# Patient Record
Sex: Female | Born: 1940 | Race: White | Hispanic: No | Marital: Married | Smoking: Never smoker
Health system: Southern US, Community
[De-identification: ages and names within clinical notes are randomized; demographics above are authoritative.]

## PROBLEM LIST (undated history)

## (undated) DIAGNOSIS — Z8719 Personal history of other diseases of the digestive system: Secondary | ICD-10-CM

## (undated) DIAGNOSIS — T8189XA Other complications of procedures, not elsewhere classified, initial encounter: Secondary | ICD-10-CM

## (undated) DIAGNOSIS — F32A Depression, unspecified: Secondary | ICD-10-CM

## (undated) DIAGNOSIS — I351 Nonrheumatic aortic (valve) insufficiency: Secondary | ICD-10-CM

## (undated) DIAGNOSIS — E039 Hypothyroidism, unspecified: Secondary | ICD-10-CM

## (undated) DIAGNOSIS — D6851 Activated protein C resistance: Secondary | ICD-10-CM

## (undated) DIAGNOSIS — R1900 Intra-abdominal and pelvic swelling, mass and lump, unspecified site: Secondary | ICD-10-CM

## (undated) DIAGNOSIS — I4821 Permanent atrial fibrillation: Secondary | ICD-10-CM

## (undated) DIAGNOSIS — K219 Gastro-esophageal reflux disease without esophagitis: Secondary | ICD-10-CM

## (undated) DIAGNOSIS — Z86718 Personal history of other venous thrombosis and embolism: Secondary | ICD-10-CM

## (undated) DIAGNOSIS — I10 Essential (primary) hypertension: Secondary | ICD-10-CM

## (undated) DIAGNOSIS — M1990 Unspecified osteoarthritis, unspecified site: Secondary | ICD-10-CM

## (undated) DIAGNOSIS — Z87442 Personal history of urinary calculi: Secondary | ICD-10-CM

## (undated) DIAGNOSIS — Z86018 Personal history of other benign neoplasm: Secondary | ICD-10-CM

## (undated) DIAGNOSIS — Z8489 Family history of other specified conditions: Secondary | ICD-10-CM

## (undated) DIAGNOSIS — R350 Frequency of micturition: Secondary | ICD-10-CM

## (undated) DIAGNOSIS — R310 Gross hematuria: Secondary | ICD-10-CM

## (undated) DIAGNOSIS — Z7901 Long term (current) use of anticoagulants: Secondary | ICD-10-CM

## (undated) DIAGNOSIS — R3915 Urgency of urination: Secondary | ICD-10-CM

## (undated) DIAGNOSIS — F329 Major depressive disorder, single episode, unspecified: Secondary | ICD-10-CM

## (undated) DIAGNOSIS — N200 Calculus of kidney: Secondary | ICD-10-CM

## (undated) DIAGNOSIS — Z853 Personal history of malignant neoplasm of breast: Secondary | ICD-10-CM

## (undated) DIAGNOSIS — Z72820 Sleep deprivation: Secondary | ICD-10-CM

## (undated) DIAGNOSIS — M7070 Other bursitis of hip, unspecified hip: Secondary | ICD-10-CM

## (undated) HISTORY — DX: Depression, unspecified: F32.A

## (undated) HISTORY — DX: Unspecified osteoarthritis, unspecified site: M19.90

## (undated) HISTORY — DX: Major depressive disorder, single episode, unspecified: F32.9

## (undated) HISTORY — PX: KNEE ARTHROSCOPY: SHX127

## (undated) HISTORY — DX: Nonrheumatic aortic (valve) insufficiency: I35.1

## (undated) HISTORY — PX: CARDIOVASCULAR STRESS TEST: SHX262

## (undated) HISTORY — DX: Permanent atrial fibrillation: I48.21

## (undated) HISTORY — PX: TONSILLECTOMY: SUR1361

---

## 1979-04-11 HISTORY — PX: DILATION AND CURETTAGE OF UTERUS: SHX78

## 1979-04-11 HISTORY — PX: TUBAL LIGATION: SHX77

## 1981-08-10 HISTORY — PX: ABDOMINAL HYSTERECTOMY: SHX81

## 1999-01-16 ENCOUNTER — Emergency Department (HOSPITAL_COMMUNITY): Admission: EM | Admit: 1999-01-16 | Discharge: 1999-01-16 | Payer: Self-pay | Admitting: Emergency Medicine

## 2000-03-01 ENCOUNTER — Encounter: Admission: RE | Admit: 2000-03-01 | Discharge: 2000-03-01 | Payer: Self-pay | Admitting: Obstetrics and Gynecology

## 2000-03-01 ENCOUNTER — Encounter: Payer: Self-pay | Admitting: Obstetrics and Gynecology

## 2001-03-02 ENCOUNTER — Encounter: Admission: RE | Admit: 2001-03-02 | Discharge: 2001-03-02 | Payer: Self-pay | Admitting: Obstetrics and Gynecology

## 2001-03-02 ENCOUNTER — Encounter: Payer: Self-pay | Admitting: Obstetrics and Gynecology

## 2002-03-06 ENCOUNTER — Encounter: Payer: Self-pay | Admitting: Obstetrics and Gynecology

## 2002-03-06 ENCOUNTER — Encounter: Admission: RE | Admit: 2002-03-06 | Discharge: 2002-03-06 | Payer: Self-pay | Admitting: Obstetrics and Gynecology

## 2002-10-11 ENCOUNTER — Encounter: Payer: Self-pay | Admitting: Family Medicine

## 2002-10-11 ENCOUNTER — Encounter: Admission: RE | Admit: 2002-10-11 | Discharge: 2002-10-11 | Payer: Self-pay | Admitting: Family Medicine

## 2003-02-16 ENCOUNTER — Ambulatory Visit (HOSPITAL_COMMUNITY): Admission: RE | Admit: 2003-02-16 | Discharge: 2003-02-16 | Payer: Self-pay | Admitting: Gastroenterology

## 2003-05-15 ENCOUNTER — Encounter: Admission: RE | Admit: 2003-05-15 | Discharge: 2003-05-15 | Payer: Self-pay | Admitting: Orthopedic Surgery

## 2003-05-15 ENCOUNTER — Encounter: Payer: Self-pay | Admitting: Orthopedic Surgery

## 2003-05-29 ENCOUNTER — Ambulatory Visit (HOSPITAL_BASED_OUTPATIENT_CLINIC_OR_DEPARTMENT_OTHER): Admission: RE | Admit: 2003-05-29 | Discharge: 2003-05-29 | Payer: Self-pay | Admitting: Orthopaedic Surgery

## 2003-06-05 ENCOUNTER — Encounter: Admission: RE | Admit: 2003-06-05 | Discharge: 2003-06-05 | Payer: Self-pay | Admitting: Orthopaedic Surgery

## 2003-06-05 ENCOUNTER — Inpatient Hospital Stay (HOSPITAL_COMMUNITY): Admission: AD | Admit: 2003-06-05 | Discharge: 2003-06-09 | Payer: Self-pay | Admitting: Internal Medicine

## 2004-01-29 ENCOUNTER — Ambulatory Visit (HOSPITAL_COMMUNITY): Admission: RE | Admit: 2004-01-29 | Discharge: 2004-01-29 | Payer: Self-pay | Admitting: Internal Medicine

## 2004-11-03 ENCOUNTER — Encounter: Admission: RE | Admit: 2004-11-03 | Discharge: 2004-11-03 | Payer: Self-pay | Admitting: *Deleted

## 2005-10-30 ENCOUNTER — Emergency Department (HOSPITAL_COMMUNITY): Admission: EM | Admit: 2005-10-30 | Discharge: 2005-10-30 | Payer: Self-pay | Admitting: Emergency Medicine

## 2006-02-01 ENCOUNTER — Encounter: Admission: RE | Admit: 2006-02-01 | Discharge: 2006-02-01 | Payer: Self-pay | Admitting: Obstetrics and Gynecology

## 2006-05-07 ENCOUNTER — Encounter (INDEPENDENT_AMBULATORY_CARE_PROVIDER_SITE_OTHER): Payer: Self-pay | Admitting: Cardiology

## 2006-05-07 ENCOUNTER — Encounter: Payer: Self-pay | Admitting: Emergency Medicine

## 2006-05-07 ENCOUNTER — Inpatient Hospital Stay (HOSPITAL_COMMUNITY): Admission: EM | Admit: 2006-05-07 | Discharge: 2006-05-08 | Payer: Self-pay | Admitting: Cardiology

## 2007-01-07 ENCOUNTER — Ambulatory Visit (HOSPITAL_COMMUNITY): Admission: RE | Admit: 2007-01-07 | Discharge: 2007-01-07 | Payer: Self-pay | Admitting: Urology

## 2007-01-07 ENCOUNTER — Encounter (INDEPENDENT_AMBULATORY_CARE_PROVIDER_SITE_OTHER): Payer: Self-pay | Admitting: Urology

## 2007-01-07 HISTORY — PX: OTHER SURGICAL HISTORY: SHX169

## 2007-05-11 ENCOUNTER — Encounter: Admission: RE | Admit: 2007-05-11 | Discharge: 2007-05-11 | Payer: Self-pay | Admitting: Obstetrics and Gynecology

## 2007-09-15 ENCOUNTER — Ambulatory Visit (HOSPITAL_BASED_OUTPATIENT_CLINIC_OR_DEPARTMENT_OTHER): Admission: RE | Admit: 2007-09-15 | Discharge: 2007-09-15 | Payer: Self-pay | Admitting: Urology

## 2007-09-15 HISTORY — PX: OTHER SURGICAL HISTORY: SHX169

## 2007-09-19 ENCOUNTER — Ambulatory Visit (HOSPITAL_COMMUNITY): Admission: RE | Admit: 2007-09-19 | Discharge: 2007-09-19 | Payer: Self-pay | Admitting: Urology

## 2008-05-08 ENCOUNTER — Ambulatory Visit: Payer: Self-pay | Admitting: Urology

## 2008-06-13 ENCOUNTER — Encounter: Admission: RE | Admit: 2008-06-13 | Discharge: 2008-06-13 | Payer: Self-pay | Admitting: Obstetrics and Gynecology

## 2008-08-10 HISTORY — PX: BREAST BIOPSY: SHX20

## 2009-01-28 ENCOUNTER — Encounter: Admission: RE | Admit: 2009-01-28 | Discharge: 2009-01-28 | Payer: Self-pay | Admitting: Orthopedic Surgery

## 2009-06-25 ENCOUNTER — Encounter: Admission: RE | Admit: 2009-06-25 | Discharge: 2009-06-25 | Payer: Self-pay | Admitting: Obstetrics and Gynecology

## 2009-07-11 ENCOUNTER — Encounter: Admission: RE | Admit: 2009-07-11 | Discharge: 2009-07-11 | Payer: Self-pay | Admitting: Obstetrics and Gynecology

## 2009-07-18 ENCOUNTER — Encounter: Admission: RE | Admit: 2009-07-18 | Discharge: 2009-07-18 | Payer: Self-pay | Admitting: Obstetrics and Gynecology

## 2009-07-23 ENCOUNTER — Ambulatory Visit: Payer: Self-pay | Admitting: Oncology

## 2009-07-25 ENCOUNTER — Ambulatory Visit: Admission: RE | Admit: 2009-07-25 | Discharge: 2009-08-09 | Payer: Self-pay | Admitting: Radiation Oncology

## 2009-07-31 ENCOUNTER — Encounter: Admission: RE | Admit: 2009-07-31 | Discharge: 2009-07-31 | Payer: Self-pay | Admitting: General Surgery

## 2009-08-05 ENCOUNTER — Ambulatory Visit (HOSPITAL_BASED_OUTPATIENT_CLINIC_OR_DEPARTMENT_OTHER): Admission: RE | Admit: 2009-08-05 | Discharge: 2009-08-05 | Payer: Self-pay | Admitting: General Surgery

## 2009-08-05 ENCOUNTER — Encounter: Admission: RE | Admit: 2009-08-05 | Discharge: 2009-08-05 | Payer: Self-pay | Admitting: General Surgery

## 2009-08-05 HISTORY — PX: PARTIAL MASTECTOMY WITH AXILLARY SENTINEL LYMPH NODE BIOPSY: SHX6004

## 2009-08-21 ENCOUNTER — Ambulatory Visit: Admission: RE | Admit: 2009-08-21 | Discharge: 2009-10-14 | Payer: Self-pay | Admitting: Radiation Oncology

## 2009-09-02 ENCOUNTER — Ambulatory Visit: Payer: Self-pay | Admitting: Oncology

## 2009-09-02 ENCOUNTER — Encounter: Admission: RE | Admit: 2009-09-02 | Discharge: 2009-09-02 | Payer: Self-pay | Admitting: Radiation Oncology

## 2009-09-04 LAB — CBC WITH DIFFERENTIAL/PLATELET
BASO%: 0.5 % (ref 0.0–2.0)
EOS%: 6.8 % (ref 0.0–7.0)
LYMPH%: 32.2 % (ref 14.0–49.7)
MCH: 31.3 pg (ref 25.1–34.0)
MCHC: 34 g/dL (ref 31.5–36.0)
MCV: 92 fL (ref 79.5–101.0)
MONO%: 12.6 % (ref 0.0–14.0)
Platelets: 198 10*3/uL (ref 145–400)
RBC: 4.38 10*6/uL (ref 3.70–5.45)
WBC: 4 10*3/uL (ref 3.9–10.3)

## 2009-09-04 LAB — COMPREHENSIVE METABOLIC PANEL
ALT: 17 U/L (ref 0–35)
Alkaline Phosphatase: 84 U/L (ref 39–117)
Creatinine, Ser: 0.81 mg/dL (ref 0.40–1.20)
Sodium: 140 mEq/L (ref 135–145)
Total Bilirubin: 0.5 mg/dL (ref 0.3–1.2)
Total Protein: 6.9 g/dL (ref 6.0–8.3)

## 2009-11-05 ENCOUNTER — Ambulatory Visit: Payer: Self-pay | Admitting: Oncology

## 2010-03-03 ENCOUNTER — Encounter: Admission: RE | Admit: 2010-03-03 | Discharge: 2010-03-03 | Payer: Self-pay | Admitting: Obstetrics and Gynecology

## 2010-06-17 ENCOUNTER — Ambulatory Visit: Payer: Self-pay | Admitting: Oncology

## 2010-08-10 HISTORY — PX: CATARACT EXTRACTION W/ INTRAOCULAR LENS  IMPLANT, BILATERAL: SHX1307

## 2010-08-27 ENCOUNTER — Encounter
Admission: RE | Admit: 2010-08-27 | Discharge: 2010-08-27 | Payer: Self-pay | Source: Home / Self Care | Attending: Obstetrics and Gynecology | Admitting: Obstetrics and Gynecology

## 2010-11-10 LAB — COMPREHENSIVE METABOLIC PANEL
ALT: 15 U/L (ref 0–35)
Alkaline Phosphatase: 72 U/L (ref 39–117)
BUN: 13 mg/dL (ref 6–23)
CO2: 31 mEq/L (ref 19–32)
Calcium: 8.8 mg/dL (ref 8.4–10.5)
GFR calc non Af Amer: >60 mL/min (ref >60)
Glucose, Bld: 81 mg/dL (ref 70–99)
Potassium: 4.2 mEq/L (ref 3.5–5.1)
Total Protein: 6.7 g/dL (ref 6.0–8.3)

## 2010-11-10 LAB — DIFFERENTIAL
Basophils Absolute: 0 10*3/uL (ref 0.0–0.1)
Basophils Relative: 1 % (ref 0–1)
Eosinophils Absolute: 0 10*3/uL (ref 0.0–0.7)
Monocytes Relative: 11 % (ref 3–12)
Neutro Abs: 1.7 10*3/uL (ref 1.7–7.7)
Neutrophils Relative %: 50 % (ref 43–77)

## 2010-11-10 LAB — URINE MICROSCOPIC-ADD ON

## 2010-11-10 LAB — URINALYSIS, ROUTINE W REFLEX MICROSCOPIC
Glucose, UA: NEGATIVE mg/dL
Ketones, ur: NEGATIVE mg/dL
Nitrite: NEGATIVE
Protein, ur: NEGATIVE mg/dL
Urobilinogen, UA: 1 mg/dL (ref 0.0–1.0)

## 2010-11-10 LAB — CBC
HCT: 38.2 % (ref 36.0–46.0)
Hemoglobin: 13.3 g/dL (ref 12.0–15.0)
MCHC: 34.8 g/dL (ref 30.0–36.0)
RBC: 4.15 MIL/uL (ref 3.87–5.11)
RDW: 13 % (ref 11.5–15.5)

## 2010-11-10 LAB — LACTATE DEHYDROGENASE: LDH: 185 U/L (ref 94–250)

## 2010-11-10 LAB — APTT: aPTT: 25 seconds (ref 24–37)

## 2010-11-10 LAB — URINE CULTURE

## 2010-12-23 NOTE — Op Note (Signed)
NAMEEBONIQUE, HALLSTROM               ACCOUNT NO.:  [id number]   MEDICAL RECORD NO.:  [id number]          PATIENT TYPE:  AMB   LOCATION:  DAY                          FACILITY:  [facility]   PHYSICIAN:  Jamison Neighbor, M.D.  DATE OF BIRTH:  Aug 19, 1940   DATE OF PROCEDURE:  01/07/2007  DATE OF DISCHARGE:                               OPERATIVE REPORT   PREOPERATIVE DIAGNOSIS:  1. Gross hematuria.  2. Right renal calculus.  3. Squamous metaplasia/cystitis cystic   POSTOPERATIVE DIAGNOSIS:  1. Gross hematuria.  2. Right renal calculus.  3. Squamous metaplasia/cystitis cystic   PROCEDURE:  Cystoscopy, bilateral retrogrades with interpretation and  bladder biopsy with fulguration.   SURGEON:  Jamison Neighbor, M.D.   ANESTHESIA:  General.   COMPLICATIONS:  None.   DRAINS:  None.   BRIEF HISTORY:  This 70 year old female has a problem with gross  hematuria.  The patient underwent evaluation several years ago which was  unremarkable.  More recently she had a CT scan which suggested she had a  stone in the right renal pelvis without obstruction.  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  Because of the  gross hematuria however it was felt that the patient should have this  area biopsied and then cauterized to stop this problem for  her.  The  patient understands the risks and benefits of the procedure and gave  full informed consent.  Her Coumadin has been stopped in preparation for  the procedure.   PROCEDURE:  After successful induction of general anesthesia the patient  was placed in the dorsal lithotomy position, prepped with Betadine and  draped in the usual sterile fashion.  Careful bimanual examination  revealed no palpable masses.  The patient does not have a cystocele,  rectocele or enterocele.  The urethra was noted be somewhat patulous and  cystoscopic examination  confirmed the fact that she has a somewhat open  bladder neck.  This would be consistent with urinary incontinence.  Careful inspection of the bladder showed that the patient did have a  polyp at the base of the bladder that was cauterized and also standard  findings consistent with squamous metaplasia.  The ureteral orifices  were identified and were normal.  The right ureter was slightly tighter  than the left but both were within normal limits.  The remainder of the  bladder was completely unremarkable in its appearance.   A right retrograde was performed through a 6-French ureteral catheter.  The ureter was of normal caliber all the way up to the pelvis.  The CT  scan suggested that there was a stone.  It was a little difficult to see  that on the fluoroscopic image but it was noted that the patient has a  slightly dilated collecting system.  There was no evidence of a clear-  cut filling defect but there did appear to be a possible stone towards  the UPJ.  The drain out films showed a little bit of a delay  on drainage  of the right kidney but the ureter drained normally.  A retrograde study  was performed on the left-hand side with a 6-French ureteral catheter as  well.  This was completely normal.  A finely formed  caliceal system was  identified .  Drainage was normal with no signs of any obstruction.   After completion of the retrograde study the biopsy forceps were used to  take a biopsy of the polypoid lesion which was removed in its entirety.  The biopsy site was cauterized as was some of the metaplastic area  around it to decrease her risk for hematuria.  The bladder was drained  and the patient was taken to recovery room in good condition.  She will  be sent out with Lorcet Plus, Pyridium Plus and doxycycline.  We will  see her back in follow-up.  At that point we will discuss with her the  possibility of treating that renal calculus.  Clearly the fact that she  does have  atrial fibrillation and will need to come off Coumadin will be  somewhat of a problem but as I certainly believe that if the patient is  symptomatic on that side the stone can be treated with ESWL without too  much trouble.           ______________________________  Jamison Neighbor, M.D.  Electronically Signed     RJE/MEDQ  D:  01/07/2007  T:  01/07/2007  Job:  [id number]

## 2010-12-23 NOTE — Op Note (Signed)
Jamie Coffey, Jamie Coffey               ACCOUNT NO.:  [id number]   MEDICAL RECORD NO.:  [id number]          PATIENT TYPE:  AMB   LOCATION:  NESC                         FACILITY:  [facility]   PHYSICIAN:  Boston Service, M.D.DATE OF BIRTH:  1941-02-02   DATE OF PROCEDURE:  09/15/2007  DATE OF DISCHARGE:                               OPERATIVE REPORT   PREOPERATIVE DIAGNOSIS:  A 70 year old female with intermittent episodes  of gross hematuria while on Coumadin.  Evaluation shows progressive  growth of an 11 mm stone within the right kidney.  The patient has  periodic hematuria usually related to exertion.  CT and cystoscopy  otherwise normal.  Discussed situation with the patient.  Am inclined to  proceed with stent placement followed by ESWL.  The patient will be off  of Coumadin during those procedures.   POSTOPERATIVE DIAGNOSIS:  A 70 year old female with intermittent  episodes of gross hematuria while on Coumadin.  Evaluation shows  progressive growth of an 11 mm stone within the right kidney.  The  patient has periodic hematuria usually related to exertion.  CT and  cystoscopy otherwise normal.  Discussed situation with the patient.  Am  inclined to proceed with stent placement followed by ESWL.  The patient  will be off of Coumadin during those procedures.   PROCEDURE:  Cystoscopy, retrograde double J stent placement.   ANESTHESIA:  General.   DRAINS:  A 6 French 24 cm double J stent.   COMPLICATIONS:  None obvious.   ANESTHESIA:  General.   DESCRIPTION OF PROCEDURE:  The patient was prepped and draped in the  dorsal lithotomy position after institution of an adequate level of  general anesthesia.  A well lubricated 21 French panendoscope was gently  inserted at the urethral meatus, normal urethra and sphincter.  Bladder  was carefully inspected with the 12 and 70 degree lenses.  No obvious  evidence of intravesical pathology.   Blocking catheter was selected, retrogrades  were performed.  Normal  course and caliber of the ureter, pelvis and calyces on the left.  The  patient had normal course and caliber of the ureter, pelvis and calyces  on the right with an 11 mm filling defect in the lower pole calyces.  Ureter measured at about 24 cm.  A 6 French 24 cm double J stent  was selected, passed over the indwelling guidewire with excellent  pigtail formation on guidewire removal.  Bladder was drained.  Cystoscope was removed.  The patient was given a B and O suppository and  returned to recovery in satisfactory condition.           ______________________________  Boston Service, M.D.     RH/MEDQ  D:  09/15/2007  T:  09/16/2007  Job:  [id number]   cc:   Armanda Magic, M.D.  Fax: [phone]   Barry Dienes. Eloise Harman, M.D.  Fax: [phone]

## 2010-12-26 NOTE — Discharge Summary (Signed)
NAME:  Jamie Coffey NO.:  [id number]   MEDICAL RECORD NO.:  [id number]                   PATIENT TYPE:  INP   LOCATION:  0355                                 FACILITY:  [facility]   PHYSICIAN:  Sherin Quarry, MD                   DATE OF BIRTH:  1941/01/02   DATE OF ADMISSION:  06/05/2003  DATE OF DISCHARGE:                                 DISCHARGE SUMMARY   Jamie Coffey was admitted to [facility] on October 26.  At that  time she indicated that 1 week prior to the admission she had undergone left  knee arthroscopy for evaluation of a cartilage injury. She was done as an  outpatient.  Postprocedure she noted persistent discomfort in the leg with  gradual onset of swelling. In the last 48 hours she had experienced mild  chest pressure with the sense of aching and soreness in her chest.  In light  of these symptoms, the patient was sent for a venous Doppler on October 26  and was found to have evidence of a DVT in the left lower extremity.  A  Spiral CT scan of the chest was performed which showed 2 areas of the lung  consistent with pulmonary embolus.  The patient was, therefore, admitted for  the purpose of anticoagulation.   PHYSICAL EXAMINATION:  GENERAL:  On physical exam she was a pleasant,  cooperative lady who was in no acute distress.  HEENT:  Exam was within normal limits.  CHEST:  The chest was clear.  CARDIOVASCULAR:  Exam revealed normal S1 and S2 without rubs, murmurs, or  gallops.  ABDOMEN:  The abdomen was benign.  NEUROLOGIC:  Testing was within normal limits.  EXTREMITIES:  Examination of the extremities revealed diffuse mild swelling  of the left lower extremity from the knee to the ankle.  There was visual  evidence of previous arthroscopy procedure.   LABS:  Relevant laboratory studies obtained included a CBC which revealed a  hemoglobin of 10.6, white count was 8500.  Sodium 137, potassium 4.2, the  creatinine was  0.9. The initial INR was 0.9.   HOSPITAL COURSE:  The patient was started on a regimen of Lovenox  subcutaneously q.12h.  The dose, based on weight, was 60 mg q.12h.  She was  also begun on Coumadin for the purpose of anticoagulation. The patient  experienced improvement in the feeling of her leg and was able to gradually  increase ambulation.  By October 30, she was felt to be a candidate for  discharge.   DISCHARGE DIAGNOSES:  1. Deep vein thrombophlebitis, left leg.  2. Pulmonary embolus by spiral CT scan.  3. History of hypertension.  4. History of hypothyroidism.  5. On discharge the patient will continue Atenolol 25 mg daily and Synthroid     0.5 mg daily.  6. She was counseled to discontinue Premarin.  7. She will also take Coumadin.  I suggested that since her INR was 2.7 at     the time of discharge that she take 2.5     mg of Coumadin on Saturday, 5 mg on Sunday and then report to Dr. Shaune Pollack office on Monday to have a repeat prothrombin time.   CONDITION:  Her condition at the time of discharge was good.                                               Sherin Quarry, MD    SY/MEDQ  D:  06/09/2003  T:  06/09/2003  Job:  [id number]   cc:   Duncan Dull, M.D.  [address]  [place]  [place] [address]  Fax: [phone]   Claude Manges. Cleophas Dunker, M.D.  [address] [place]  [place] [address]  Fax: [phone]

## 2010-12-26 NOTE — Op Note (Signed)
NAME:  Jamie Coffey, Jamie Coffey                         ACCOUNT NO.:  [id number]   MEDICAL RECORD NO.:  [id number]                   PATIENT TYPE:  AMB   LOCATION:  ENDO                                 FACILITY:  [facility]   PHYSICIAN:  Anselmo Rod, M.D.               DATE OF BIRTH:  [birth year]   DATE OF PROCEDURE:  02/16/2003  DATE OF DISCHARGE:                                 OPERATIVE REPORT   PROCEDURE:  Screening colonoscopy.   ENDOSCOPIST:  Anselmo Rod, M.D.   INSTRUMENT USED:  Olympus video colonoscope.   INDICATIONS FOR PROCEDURE:  A 70 year old white female with a family history  of  colon cancer in a maternal aunt and uterine cancer in her mother,  undergoing screening colonoscopy, rule out colonic polyps, masses, etc.   PREPROCEDURE PREPARATION:  Informed consent was obtained from the patient  and the patient was  fasted for 8 hours prior to  the procedure and prepped  with a bottle of magnesium citrate and a gallon of GoLYTELY the  night prior  to the procedure. The patient was given Cipro 400 mg IV for  MVP prophylaxis  prior to the procedure.   PREPROCEDURE PHYSICAL:  The patient had stable vital signs. Neck supple.  Chest clear to auscultation, normal S1, S2. Abdomen soft with normoactive  bowel sounds.   DESCRIPTION OF PROCEDURE:  The patient was placed in the left lateral  decubitus position and sedated with 60 mg of Demerol and 6 mg of Versed  intravenously. Once sedation was adequate, the patient was maintained on low  flow oxygen and continuous cardiac monitoring.   The Olympus video colonoscope was advanced into the rectum to the cecum  without difficulty.  There was some residual  stool  in the colon. Multiple  washings were done. Small  lesions could have been missed. No masses,  polyps, erosions or ulcerations or diverticula were seen.  The appendiceal  orifice and ileocecal valve were clearly visualized and photographed.  Retroflexion in the rectum  revealed no abnormalities as well.   IMPRESSION:  Normal colonoscopy.   RECOMMENDATIONS:  1. Repeat colorectal cancer screening is recommended in the next 5 years     unless the patient develops any     abnormal symptoms in the interim.  2. High fiber diet with liberal fluid intake.  3. Outpatient followup if the need arises in the future.                                                Anselmo Rod, M.D.    JNM/MEDQ  D:  02/16/2003  T:  02/17/2003  Job:  [id number]   cc:   Duncan Dull, M.D.  [address]  [place]  [place] [address]  Fax:  [phone]  

## 2010-12-26 NOTE — H&P (Signed)
[name], Coffey               ACCOUNT NO.:  [id number]   MEDICAL RECORD NO.:  [id number]          PATIENT TYPE:  INP   LOCATION:  3743                         FACILITY:  [facility]   PHYSICIAN:  Ulyses Amor, MD DATE OF BIRTH:  [birth year]   DATE OF ADMISSION:  05/07/2006  DATE OF DISCHARGE:                                HISTORY & PHYSICAL   HISTORY OF PRESENT ILLNESS:  Jamie Coffey is a 70 year old white woman who  was admitted to [facility] for further evaluation of chest pain and  new onset atrial fibrillation.   The patient, who has no past history of cardiac disease, experienced the  onset of chest pain while she was lying in bed this evening.  This was  described as a sharp and pressured discomfort in her mid substernal region.  It did not radiate.  It was associated with a sensation of a rapid and  irregular heart beat.  There was no associated dyspnea, diaphoresis, or  nausea.  There were no exacerbating or alleviating factors.  It appeared not  to be related to position, activity, meals, or respirations.  She ultimately  presented to the emergency department at [facility] where she was  found to be in atrial fibrillation with a moderate ventricular rate.  Her  chest pain ultimately resolved.  The total duration of chest pain was  approximately four hours.   As noted, the patient has no past history of cardiac disease, no history of  chest pain, myocardial infarction, congestive heart failure, or arrhythmia.  She does note erratic skipped heart beats in the past.  The patient has a  number of risk factors for coronary artery disease including hypertension  and a family history of coronary artery disease (both parents).  The patient  has no history of dyslipidemia, diabetes mellitus, or smoking.   The patient's only other medical problem is that of hypothyroidism.   OPERATIONS:  Hysterectomy, right knee arthroscopy.  The knee surgery was  followed  by what sounds like deep venous thrombosis and pulmonary embolus  for which she was on six months of Coumadin.   MEDICATIONS:  Atenolol, Mobic, Synthroid.   ALLERGIES:  None.   SIGNIFICANT INJURIES:  None.   SOCIAL HISTORY:  The patient lives with her husband.  She does not work.  She does not smoke cigarettes.  She drinks occasional alcohol.   FAMILY HISTORY:  Coronary artery disease as described above.   REVIEW OF SYSTEMS:  Review of systems revealed no new problems related to  her head, eyes, ears, nose, mouth, throat, lungs, gastrointestinal system,  genitourinary system, or extremities.  There was no history of neurologic or  psychiatric disorder.  There was no history of fever, chills or weight loss.   PHYSICAL EXAMINATION:  GENERAL:  The patient was a middle-aged white woman  in no discomfort.  She was alert, oriented, appropriate, and responsive.  VITAL SIGNS:  Blood pressure 136/79, pulse 70 and irregularly irregular,  respirations 16, temperature 96.9.  Pulse ox 99% on room air.  HEENT:  Head, eyes, nose, and mouth  were normal.  NECK:  The neck was without thyromegaly or adenopathy.  Carotid pulses were  palpable bilaterally and without bruits.  CARDIAC:  Examination revealed an irregularly irregular rhythm.  There was  no gallop, murmur, rub, or click.  No chest wall tenderness was noted.  LUNGS:  The lungs were clear.  ABDOMEN:  The abdomen was soft and nontender.  There was no mass,  hepatosplenomegaly, bruit, distention, rebound, guarding, or rigidity.  Bowel sounds were normal.  BREASTS, PELVIC, RECTAL:  Examinations were not performed as they were not  pertinent to the reason for acute care hospitalization.  EXTREMITIES:  The extremities were without edema, deviation or deformity.  Radial and dorsalis pedis pulses were palpable bilaterally.  NEUROLOGIC:  Brief screening neurologic survey was unremarkable.   ANCILLARY DATA:  The electrocardiogram revealed atrial  fibrillation with  occasional aberrantly conducted beats.  The ventricular rate was 86 beats  per minute.  The chest radiograph, according to the radiologist, revealed  probable mild chronic obstructive pulmonary disease.  There was no evidence  of acute cardiopulmonary disease.  The initial set of cardiac markers  revealed a myoglobin of 23.4, CK-MB less than, and troponin less than 0.05.  Potassium is 3.4 with a BUN of 23 and creatinine of 0.7.  White count was  4.6 with a hemoglobin of 13.2 and hematocrit of 38.2.  The remaining studies  were pending at the time of this dictation.   IMPRESSION:  1. Chest pain, rule out coronary artery disease.  2. New onset atrial fibrillation.  3. Hypertension.  4. Hypothyroidism.   PLAN:  1. Telemetry.  2. Serial cardiac enzymes.  3. Thyroid function tests.  4. Echocardiogram.  5. Aspirin.  6. Intravenous heparin.  7. Intravenous nitroglycerin.  8. Fasting lipid profile.  9. No ventricular rate control is needed at this time.  10.Further measures per Dr. Mayford Knife.      Ulyses Amor, MD  Electronically Signed     MSC/MEDQ  D:  05/07/2006  T:  05/07/2006  Job:  [id number]   cc:   Armanda Magic, M.D.

## 2010-12-26 NOTE — Op Note (Signed)
NAME:  Jamie Coffey, Jamie Coffey                         ACCOUNT NO.:  [id number]   MEDICAL RECORD NO.:  [id number]                   PATIENT TYPE:  AMB   LOCATION:  DSC                                  FACILITY:  [facility]   PHYSICIAN:  Claude Manges. Cleophas Dunker, M.D.            DATE OF BIRTH:  1941-03-19   DATE OF PROCEDURE:  05/29/2003  DATE OF DISCHARGE:                                 OPERATIVE REPORT   PREOPERATIVE DIAGNOSIS:  Tear, medial meniscus right knee.   POSTOPERATIVE DIAGNOSES:  1. Tear, medial meniscus right knee.  2. Chondromalacia, patella and medial compartment.   PROCEDURE:  1. Arthroscopic partial medial meniscectomy.  2. Arthroscopic shaving of patella and medial compartment.   SURGEON:  Claude Manges. Cleophas Dunker, M.D.   ANESTHESIA:  IV sedation and local 1% Xylocaine knee block.   COMPLICATIONS:  None.   HISTORY:  A 70 year old female has been experiencing episodic knee pain for  several months.  She has had an MRI scan recently that revealed a tear of  the posterior horn of the medial meniscus.  She is experiencing medial joint  pain without history of injury or trauma.  Plain x-rays did not reveal any  significant arthrosis.  She is now to have an arthroscopic evaluation.   DESCRIPTION OF PROCEDURE:  With the patient comfortable on the operating  table and under IV sedation the right lower extremity was placed in a thigh  hold.  The leg was then prepped with Duraprep from the thigh hold to the  ankle.  Sterile draping was performed.  The patient did have a Xylocaine  knee block preoperatively.   Diagnostic arthroscopy was performed using a medial and lateral parapatellar  tendon puncture site.  There was a clear yellow joint effusion.   Diagnostic arthroscopy revealed chondromalacia of the patella diffusely and  perhaps more so medially than laterally.  There was no evidence of any loose  materila.  There was very mild synovitis.  Shaving of the patella was  performed so  that the remaining articular cartilage was stable.   Both gutters were clear of loose material.   The lateral compartment was clear of meniscal pathology or significant  chondromalacia.  The ACL appeared to be intact.   The medial compartment revealed a chronic tear of the posterior horn of the  medial meniscus.  There were horizontal cleavage tears and flap tears.  These were debrided with the basket forceps and intraarticular shaver and  the ArthroCare wand so that a very nice, stable, tapered meniscal rim about  the mid portion of the meniscus.  From the anterior to the mid portion the  meniscus appeared to be intact.  There was diffuse chondromalacia over the  femoral surface which was debrided.  The tibial plateau revealed even more  minimal changes.  I suspect that the femoral changes were probably grade 2.   The joint was then explored without evidence  of loose material.  The two  stab wounds were left open, infiltrated with 0.25% Marcaine with  epinephrine. and sterile bulky dressing was applied followed by an Ace  bandage.   PLAN:  Percocet for pain.  Office in one week.                                               Claude Manges. Cleophas Dunker, M.D.    PWW/MEDQ  D:  05/29/2003  T:  05/29/2003  Job:  [id number]

## 2010-12-26 NOTE — Discharge Summary (Signed)
Jamie Coffey, SCHMUHL               ACCOUNT NO.:  [id number]   MEDICAL RECORD NO.:  [id number]          PATIENT TYPE:  INP   LOCATION:  3743                         FACILITY:  [facility]   PHYSICIAN:  Armanda Magic, M.D.     DATE OF BIRTH:  1941-03-26   DATE OF ADMISSION:  05/07/2006  DATE OF DISCHARGE:  05/08/2006                               DISCHARGE SUMMARY   ADMISSION DIAGNOSES:  1. Chest pain.  2. New onset atrial fibrillation with a moderate ventricular rate.   DISCHARGE DIAGNOSES:  1. Chest pain, resolved.  Stable without angina.  2. New onset atrial fibrillation status post spontaneous conversion to      normal sinus rhythm on May 08, 2006.  3. Hypertension.  4. Hypothyroidism.  5. Normal left ventricular function with ejection fraction of 55%.  6. Normal adenosine Cardiolite without inducible ischemia May 08, 2006.  7. Hypokalemia, repleted.   PROCEDURE:  A 2-D echocardiogram May 07, 2006, normal LV systolic  function with an EF of 55%, no regional wall motion abnormalities.  Mild  asymmetric septal hypertrophy.  Aortic valve thickness was mildly  increased.  Trivial aortic valvular regurgitation.  Right atrium was  mildly dilated.   HOSPITAL COURSE:  Mrs. Clos is a 70 year old female with no known  history of coronary artery disease.  She was admitted to the [facility].  [facility] with chest pain and new onset atrial fibrillation  on May 07, 2006.  The patient was started on IV heparin and IV  nitroglycerin.  Point of care enzymes were negative x1.  Serial cardiac  enzymes x5 revealed a peak troponin of 0.02.  TSH was elevated at 7.330.  A 12-lead EKG revealed atrial fibrillation, new onset with a moderate  ventricular response at 121 beats per minute with nonspecific ST  abnormalities without evidence of ischemia.  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  The repeat chest x-  ray revealed  cardiomegaly and mild to moderate vascular congestion.  BNP  was mildly elevated at 226.0.  The patient had no complaints of  shortness of breath other than its association with chest pain upon  admission.  D-dimer was negative.  The patient was hypokalemic during  the admission, however, her potassium was repleted.  The patient was  started on Coumadin per pharmacy protocol.  The IV heparin drip was  changed to subcu Lovenox every 12 hours per pharmacy protocol.  Later  that afternoon the patient had a recurrence of chest pain, rated 6/10,  however, EKG revealed no evidence of ischemia.  By the time the EKG was  obtained, the patient admitted to being chest pain-free.  Also on the  same day the patient converted to normal sinus rhythm, spontaneously.  She was scheduled for an adenosine Cardiolite in the a.m. which was  negative for ischemia with an EF of 61%.  The patient was weaned off of  IV nitroglycerin prior to her stress test.  She was also treated with  atenolol 25 mg daily for atrial fibrillation which was  held prior to her  stress test.  A social work consult was written for home Lovenox,  however, once the patient converted to normal sinus rhythm and remained  in normal sinus rhythm, she did not need to be discharged to home on  Lovenox.  The patient remained chest pain-free as well as in normal  sinus rhythm for the rest of her stay in the hospital and was discharged  to home in stable condition on May 08, 2006.   LABORATORY DATA:  The point of care enzymes revealed myoglobin 23.4, CK-  MB less than 1, troponin I less than 0.05.  BNP 226.  TSH 7.330.  Fasting lipid panel with total cholesterol 193, triglycerides 23, , HDL  76, LDL 112.  Serial cardiac enzymes revealed CK totals 62, 55, 55, 48,  42, and 39, respectively; CK-MBs 1.2, 1.3, 1.3, 1, 0.9, and 0.8,  respectively.  Troponin I less than 0.01, 0.01, 0.01, 0.02, 0.02, and  0.01, respectively.  Sodium 144, potassium  5.1, chloride 109, CO2 31,  glucose 106, BUN 27, creatinine 1.0, calcium 9.3.  White blood count  4.4, hemoglobin 13.5, hematocrit 39.2, platelets 218. PT 14.5, INR 1.1,  PTT greater than 200 upon admission.  PT 13.4, INR 1 and heparin level  0.54 prior to discharge.   X-RAYS:  1. 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  No acute      findings.  2. X-ray May 07, 2006, cardiomegaly and mild to moderate      vascular congestion.  3. Adenosine Cardiolite normal.  No evidence of inducible ischemia.      EF 61%.  That was performed on May 08, 2006.   EKG:  1. EKG May 07, 2006, upon admission as stated in the hospital      course.  2. May 07, 2006, atrial fibrillation with a ventricular rate of      98 beats per minute with nonspecific ST abnormality.  No evidence      of ischemia.   CONDITION ON DISCHARGE:  Mrs. Uresti was discharged to home in stable  condition May 08, 2006, without complaints of chest pain,  palpitations, shortness of breath or dizziness.  She remained in normal  sinus rhythm upon discharge.   DISCHARGE MEDICATIONS:  1. Atenolol 25 mg daily.  2. Synthroid 0.05 mg daily.  3. Mobic as needed.  4. Aspirin 81 mg daily.  5. Coumadin 5 mg daily except for 2.5 mg on Monday.  6. Benadryl 25 mg as needed.   DISCHARGE INSTRUCTIONS:  None.   FOLLOW-UP APPOINTMENTS:  The patient was advised to schedule a follow-up  appointment with Dr. Armanda Magic at [facility] within 2 weeks.  She was asked to call [phone] to schedule the appointment.      Tylene Fantasia, [place]      Armanda Magic, M.D.  Electronically Signed    RDM/MEDQ  D:  06/21/2006  T:  06/21/2006  Job:  [id number]

## 2010-12-26 NOTE — H&P (Signed)
NAME:  [name], [name] NO.:  [id number]   MEDICAL RECORD NO.:  [id number]                   PATIENT TYPE:  INP   LOCATION:  0355                                 FACILITY:  [facility]   PHYSICIAN:  Sherin Quarry, MD                   DATE OF BIRTH:  1941-06-26   DATE OF ADMISSION:  06/05/2003  DATE OF DISCHARGE:                                HISTORY & PHYSICAL   HISTORY:  Jamie Coffey is a very pleasant 70 year old lady who states that  one week prior to this admission, she underwent left knee arthroscopy for  evaluation of a cartilage injury, this was done as an outpatient. Post  procedure, she noted persistent discomfort in the leg with gradual onset of  swelling. In the last 48 hours, she has experienced mild chest pressure with  a sense of aching and soreness in the chest that does not sound pleuritic.  There has been no associated shortness of breath. In light of these  symptoms, the patient was sent for a venous Doppler study today and was  found to have evidence of DVT in the left lower extremity. After questioning  by the orthopedic PA, the patient was also sent for a spiral CT of the chest  which showed two areas of the lung consistent with pulmonary embolus. For  this reason, she was admitted directly to [facility] for  anticoagulation.   PAST MEDICAL HISTORY:  1. Medications:  2. Atenolol 25 mg daily.  3. Premarin 0.3 mg daily.  4. Synthroid 0.5 mg daily.  5. Allergies:  No known drug allergies.   ILLNESSES:  The patient has a history of hypertension x20 years and of  hypothyroidism x5 years.   PAST SURGICAL HISTORY:  She indicates that she had had the previously  mentioned total knee replacement. She has also had a breast biopsy in the  past.   FAMILY HISTORY:  The patient's mother died of recurrent myocardial  infarction, she also had a history of stroke.   SOCIAL HISTORY:  The patient had no history of cigarette smoking.  She will  occasionally drink alcohol. She denies drug abuse.   REVIEW OF SYMPTOMS:  HEAD:  She denies headache or dizziness. EYES:  She  denies visual blurring or diplopia. EAR/NOSE/THROAT:  Denies ear ache, sinus  pain, or sore throat. CHEST:  She denies coughing, wheezing or shortness of  breath. CARDIOVASCULAR:  She denies orthopnea, PND, ankle edema or  exertional chest pain. GI:  She denies nausea, vomiting, abdominal pain,  change in bowel habits, melena or hematochezia. GU:  She denies dysuria,  urinary frequency, hesitancy or nocturia. RHEUMATOLOGIC:  Denies back pain  or joint pain. HEMATOLOGIC:  Denies easy bleeding or bruising. NEUROLOGIC:  Denies history of seizure or stroke.   PHYSICAL EXAMINATION:  GENERAL:  She is a pleasant, cooperative lady who is  in no  acute distress.  HEENT:  Within normal limits.  CHEST:  Clear.  BACK:  Examination of the back reveals no CVA or point tenderness.  CARDIOVASCULAR:  Reveals normal S1 and S2. There are no rubs, murmurs or  gallops.  ABDOMEN:  Benign. Normal bowel sounds without masses, tenderness or  organomegaly.  NEUROLOGIC:  Testing is within normal limits.  EXTREMITIES:  Examination of extremities reveals diffuse mild swelling of  the left lower extremity from the knee down to the ankle. The patient has  evidence of previous arthroscopic procedure.   IMPRESSION:  1. Deep venous thrombosis of left leg. I do not have the details of the     report on this study.  2. Probable pleural effusion.  3. History of hypertension.  4. History of hypothyroidism.  5. Chronic estrogen therapy.   PLAN:  The patient will be admitted for Lovenox and will begin on Coumadin  therapy. Her Premarin should be discontinued. We will follow her closely and  make sure to arrange for outpatient management.                                               Sherin Quarry, MD    SY/MEDQ  D:  06/05/2003  T:  06/05/2003  Job:  [id number]   cc:   Duncan Dull, M.D.  [address]  [place]  [place] [address]  Fax: [phone]   Claude Manges. Cleophas Dunker, M.D.  [address] [place]  [place] [address]  Fax: [phone]

## 2011-04-21 ENCOUNTER — Telehealth (INDEPENDENT_AMBULATORY_CARE_PROVIDER_SITE_OTHER): Payer: Self-pay

## 2011-04-21 NOTE — Telephone Encounter (Signed)
Pt called stating she has area of left breast that has a pink purple discoloration. She noticed some soreness for 2 weeks but no sign of swelling,fever or mass in area. Pt is on blood thinners. January mgm normal. Reviewed with Dr Derrell Lolling via phone. Pt advised per Dr Derrell Lolling to watch area for 48hrs and note any changes such as swelling,heat or mass. If area changes pt is to come in asap to have area examined. Pt states she will do this.

## 2011-04-23 ENCOUNTER — Telehealth (INDEPENDENT_AMBULATORY_CARE_PROVIDER_SITE_OTHER): Payer: Self-pay

## 2011-04-23 DIAGNOSIS — N644 Mastodynia: Secondary | ICD-10-CM

## 2011-04-23 NOTE — Telephone Encounter (Signed)
Pt called and wanted to be seen for change in left breast. Per Dr Derrell Lolling we are to set up unilateral imaging at [facility] and then have pt come in to be examined by himself or urgent office if he is not available. I have lmom re this for pt. And will send order for imaging.

## 2011-04-24 ENCOUNTER — Ambulatory Visit
Admission: RE | Admit: 2011-04-24 | Discharge: 2011-04-24 | Disposition: A | Discharge status: Home / Self Care | Payer: Medicare Other | Source: Ambulatory Visit | Attending: General Surgery | Admitting: General Surgery

## 2011-04-24 ENCOUNTER — Telehealth (INDEPENDENT_AMBULATORY_CARE_PROVIDER_SITE_OTHER): Payer: Self-pay

## 2011-04-24 DIAGNOSIS — N644 Mastodynia: Secondary | ICD-10-CM

## 2011-04-24 NOTE — Telephone Encounter (Signed)
Pt advised of mgm result. Pt states area is resolving. Pt to keep f/u for mgm and ov exam in jan 2013. Pt will call with concerns.

## 2011-04-27 ENCOUNTER — Telehealth (INDEPENDENT_AMBULATORY_CARE_PROVIDER_SITE_OTHER): Payer: Self-pay | Admitting: General Surgery

## 2011-04-27 NOTE — Telephone Encounter (Signed)
LM for patient to call for results of mammo and [place].

## 2011-05-01 LAB — BASIC METABOLIC PANEL
Chloride: 104
Creatinine, Ser: 0.72
GFR calc Af Amer: >60
Potassium: 4.4

## 2011-05-01 LAB — PROTIME-INR
INR: 1
INR: 1.3
Prothrombin Time: 16.9 — ABNORMAL HIGH

## 2011-05-01 LAB — POCT HEMOGLOBIN-HEMACUE
Hemoglobin: 14.2
Operator id: 268271

## 2011-07-23 ENCOUNTER — Encounter (INDEPENDENT_AMBULATORY_CARE_PROVIDER_SITE_OTHER): Payer: Self-pay | Admitting: General Surgery

## 2011-08-25 DIAGNOSIS — I4891 Unspecified atrial fibrillation: Secondary | ICD-10-CM | POA: Diagnosis not present

## 2011-08-25 DIAGNOSIS — Z7901 Long term (current) use of anticoagulants: Secondary | ICD-10-CM | POA: Diagnosis not present

## 2011-09-15 ENCOUNTER — Other Ambulatory Visit: Payer: Self-pay | Admitting: Internal Medicine

## 2011-09-15 DIAGNOSIS — Z853 Personal history of malignant neoplasm of breast: Secondary | ICD-10-CM

## 2011-09-15 DIAGNOSIS — Z9889 Other specified postprocedural states: Secondary | ICD-10-CM

## 2011-09-28 ENCOUNTER — Ambulatory Visit
Admission: RE | Admit: 2011-09-28 | Discharge: 2011-09-28 | Disposition: A | Discharge status: Home / Self Care | Payer: Medicare Other | Source: Ambulatory Visit | Attending: Internal Medicine | Admitting: Internal Medicine

## 2011-09-28 DIAGNOSIS — Z9889 Other specified postprocedural states: Secondary | ICD-10-CM

## 2011-09-28 DIAGNOSIS — Z853 Personal history of malignant neoplasm of breast: Secondary | ICD-10-CM

## 2011-09-28 DIAGNOSIS — R928 Other abnormal and inconclusive findings on diagnostic imaging of breast: Secondary | ICD-10-CM | POA: Diagnosis not present

## 2011-10-06 DIAGNOSIS — I4891 Unspecified atrial fibrillation: Secondary | ICD-10-CM | POA: Diagnosis not present

## 2011-10-06 DIAGNOSIS — Z7901 Long term (current) use of anticoagulants: Secondary | ICD-10-CM | POA: Diagnosis not present

## 2011-10-23 DIAGNOSIS — M25569 Pain in unspecified knee: Secondary | ICD-10-CM | POA: Diagnosis not present

## 2011-10-27 DIAGNOSIS — M25569 Pain in unspecified knee: Secondary | ICD-10-CM | POA: Diagnosis not present

## 2011-10-30 DIAGNOSIS — L723 Sebaceous cyst: Secondary | ICD-10-CM | POA: Diagnosis not present

## 2011-10-30 DIAGNOSIS — L820 Inflamed seborrheic keratosis: Secondary | ICD-10-CM | POA: Diagnosis not present

## 2011-10-30 DIAGNOSIS — L219 Seborrheic dermatitis, unspecified: Secondary | ICD-10-CM | POA: Diagnosis not present

## 2011-11-02 DIAGNOSIS — I4891 Unspecified atrial fibrillation: Secondary | ICD-10-CM | POA: Diagnosis not present

## 2011-11-02 DIAGNOSIS — I10 Essential (primary) hypertension: Secondary | ICD-10-CM | POA: Diagnosis not present

## 2011-11-02 DIAGNOSIS — Z7901 Long term (current) use of anticoagulants: Secondary | ICD-10-CM | POA: Diagnosis not present

## 2011-11-11 DIAGNOSIS — IMO0002 Reserved for concepts with insufficient information to code with codable children: Secondary | ICD-10-CM | POA: Diagnosis not present

## 2011-11-19 DIAGNOSIS — M2240 Chondromalacia patellae, unspecified knee: Secondary | ICD-10-CM | POA: Diagnosis not present

## 2011-11-19 DIAGNOSIS — M659 Synovitis and tenosynovitis, unspecified: Secondary | ICD-10-CM | POA: Diagnosis not present

## 2011-11-19 DIAGNOSIS — Y929 Unspecified place or not applicable: Secondary | ICD-10-CM | POA: Diagnosis not present

## 2011-11-19 DIAGNOSIS — IMO0002 Reserved for concepts with insufficient information to code with codable children: Secondary | ICD-10-CM | POA: Diagnosis not present

## 2011-11-19 DIAGNOSIS — M7120 Synovial cyst of popliteal space [Baker], unspecified knee: Secondary | ICD-10-CM | POA: Diagnosis not present

## 2011-11-19 DIAGNOSIS — M9420 Chondromalacia, unspecified site: Secondary | ICD-10-CM | POA: Diagnosis not present

## 2011-11-19 DIAGNOSIS — X58XXXA Exposure to other specified factors, initial encounter: Secondary | ICD-10-CM | POA: Diagnosis not present

## 2011-11-19 DIAGNOSIS — M23305 Other meniscus derangements, unspecified medial meniscus, unspecified knee: Secondary | ICD-10-CM | POA: Diagnosis not present

## 2011-12-01 DIAGNOSIS — M23305 Other meniscus derangements, unspecified medial meniscus, unspecified knee: Secondary | ICD-10-CM | POA: Diagnosis not present

## 2011-12-02 DIAGNOSIS — I4891 Unspecified atrial fibrillation: Secondary | ICD-10-CM | POA: Diagnosis not present

## 2011-12-02 DIAGNOSIS — Z7901 Long term (current) use of anticoagulants: Secondary | ICD-10-CM | POA: Diagnosis not present

## 2011-12-03 DIAGNOSIS — M23305 Other meniscus derangements, unspecified medial meniscus, unspecified knee: Secondary | ICD-10-CM | POA: Diagnosis not present

## 2011-12-28 DIAGNOSIS — Z7901 Long term (current) use of anticoagulants: Secondary | ICD-10-CM | POA: Diagnosis not present

## 2011-12-28 DIAGNOSIS — I4891 Unspecified atrial fibrillation: Secondary | ICD-10-CM | POA: Diagnosis not present

## 2012-02-15 DIAGNOSIS — Z7901 Long term (current) use of anticoagulants: Secondary | ICD-10-CM | POA: Diagnosis not present

## 2012-02-15 DIAGNOSIS — I4891 Unspecified atrial fibrillation: Secondary | ICD-10-CM | POA: Diagnosis not present

## 2012-02-16 ENCOUNTER — Other Ambulatory Visit: Payer: Self-pay | Admitting: Gastroenterology

## 2012-02-16 DIAGNOSIS — K449 Diaphragmatic hernia without obstruction or gangrene: Secondary | ICD-10-CM | POA: Diagnosis not present

## 2012-02-16 DIAGNOSIS — D129 Benign neoplasm of anus and anal canal: Secondary | ICD-10-CM | POA: Diagnosis not present

## 2012-02-16 DIAGNOSIS — R198 Other specified symptoms and signs involving the digestive system and abdomen: Secondary | ICD-10-CM | POA: Diagnosis not present

## 2012-02-16 DIAGNOSIS — D128 Benign neoplasm of rectum: Secondary | ICD-10-CM | POA: Diagnosis not present

## 2012-02-16 DIAGNOSIS — D126 Benign neoplasm of colon, unspecified: Secondary | ICD-10-CM | POA: Diagnosis not present

## 2012-02-16 DIAGNOSIS — Z800 Family history of malignant neoplasm of digestive organs: Secondary | ICD-10-CM | POA: Diagnosis not present

## 2012-02-16 DIAGNOSIS — R1310 Dysphagia, unspecified: Secondary | ICD-10-CM | POA: Diagnosis not present

## 2012-02-16 DIAGNOSIS — D131 Benign neoplasm of stomach: Secondary | ICD-10-CM | POA: Diagnosis not present

## 2012-03-04 DIAGNOSIS — I4891 Unspecified atrial fibrillation: Secondary | ICD-10-CM | POA: Diagnosis not present

## 2012-03-04 DIAGNOSIS — Z7901 Long term (current) use of anticoagulants: Secondary | ICD-10-CM | POA: Diagnosis not present

## 2012-03-28 DIAGNOSIS — R1310 Dysphagia, unspecified: Secondary | ICD-10-CM | POA: Diagnosis not present

## 2012-04-07 DIAGNOSIS — Z7901 Long term (current) use of anticoagulants: Secondary | ICD-10-CM | POA: Diagnosis not present

## 2012-04-07 DIAGNOSIS — I4891 Unspecified atrial fibrillation: Secondary | ICD-10-CM | POA: Diagnosis not present

## 2012-05-09 DIAGNOSIS — I10 Essential (primary) hypertension: Secondary | ICD-10-CM | POA: Diagnosis not present

## 2012-05-09 DIAGNOSIS — Z7901 Long term (current) use of anticoagulants: Secondary | ICD-10-CM | POA: Diagnosis not present

## 2012-05-09 DIAGNOSIS — I4891 Unspecified atrial fibrillation: Secondary | ICD-10-CM | POA: Diagnosis not present

## 2012-06-20 DIAGNOSIS — I4891 Unspecified atrial fibrillation: Secondary | ICD-10-CM | POA: Diagnosis not present

## 2012-06-20 DIAGNOSIS — Z7901 Long term (current) use of anticoagulants: Secondary | ICD-10-CM | POA: Diagnosis not present

## 2012-06-23 DIAGNOSIS — E039 Hypothyroidism, unspecified: Secondary | ICD-10-CM | POA: Diagnosis not present

## 2012-06-23 DIAGNOSIS — I10 Essential (primary) hypertension: Secondary | ICD-10-CM | POA: Diagnosis not present

## 2012-06-23 DIAGNOSIS — R82998 Other abnormal findings in urine: Secondary | ICD-10-CM | POA: Diagnosis not present

## 2012-06-23 DIAGNOSIS — E785 Hyperlipidemia, unspecified: Secondary | ICD-10-CM | POA: Diagnosis not present

## 2012-06-30 DIAGNOSIS — Z23 Encounter for immunization: Secondary | ICD-10-CM | POA: Diagnosis not present

## 2012-06-30 DIAGNOSIS — Z0000 Encounter for general adult medical examination without abnormal findings: Secondary | ICD-10-CM | POA: Diagnosis not present

## 2012-06-30 DIAGNOSIS — E039 Hypothyroidism, unspecified: Secondary | ICD-10-CM | POA: Diagnosis not present

## 2012-06-30 DIAGNOSIS — I10 Essential (primary) hypertension: Secondary | ICD-10-CM | POA: Diagnosis not present

## 2012-06-30 DIAGNOSIS — E785 Hyperlipidemia, unspecified: Secondary | ICD-10-CM | POA: Diagnosis not present

## 2012-06-30 DIAGNOSIS — Z1331 Encounter for screening for depression: Secondary | ICD-10-CM | POA: Diagnosis not present

## 2012-07-11 DIAGNOSIS — Z1212 Encounter for screening for malignant neoplasm of rectum: Secondary | ICD-10-CM | POA: Diagnosis not present

## 2012-07-18 DIAGNOSIS — M1710 Unilateral primary osteoarthritis, unspecified knee: Secondary | ICD-10-CM | POA: Diagnosis not present

## 2012-07-28 DIAGNOSIS — Z7901 Long term (current) use of anticoagulants: Secondary | ICD-10-CM | POA: Diagnosis not present

## 2012-07-28 DIAGNOSIS — I4891 Unspecified atrial fibrillation: Secondary | ICD-10-CM | POA: Diagnosis not present

## 2012-08-04 DIAGNOSIS — I4891 Unspecified atrial fibrillation: Secondary | ICD-10-CM | POA: Diagnosis not present

## 2012-08-04 DIAGNOSIS — Z7901 Long term (current) use of anticoagulants: Secondary | ICD-10-CM | POA: Diagnosis not present

## 2012-08-19 DIAGNOSIS — IMO0002 Reserved for concepts with insufficient information to code with codable children: Secondary | ICD-10-CM | POA: Diagnosis not present

## 2012-08-24 DIAGNOSIS — IMO0002 Reserved for concepts with insufficient information to code with codable children: Secondary | ICD-10-CM | POA: Diagnosis not present

## 2012-08-25 DIAGNOSIS — I4891 Unspecified atrial fibrillation: Secondary | ICD-10-CM | POA: Diagnosis not present

## 2012-08-25 DIAGNOSIS — Z7901 Long term (current) use of anticoagulants: Secondary | ICD-10-CM | POA: Diagnosis not present

## 2012-08-31 DIAGNOSIS — IMO0002 Reserved for concepts with insufficient information to code with codable children: Secondary | ICD-10-CM | POA: Diagnosis not present

## 2012-08-31 DIAGNOSIS — M25569 Pain in unspecified knee: Secondary | ICD-10-CM | POA: Diagnosis not present

## 2012-09-12 DIAGNOSIS — H2510 Age-related nuclear cataract, unspecified eye: Secondary | ICD-10-CM | POA: Diagnosis not present

## 2012-09-12 DIAGNOSIS — H5210 Myopia, unspecified eye: Secondary | ICD-10-CM | POA: Diagnosis not present

## 2012-09-12 DIAGNOSIS — H04129 Dry eye syndrome of unspecified lacrimal gland: Secondary | ICD-10-CM | POA: Diagnosis not present

## 2012-09-12 DIAGNOSIS — H43819 Vitreous degeneration, unspecified eye: Secondary | ICD-10-CM | POA: Diagnosis not present

## 2012-09-15 ENCOUNTER — Other Ambulatory Visit: Payer: Self-pay | Admitting: Internal Medicine

## 2012-09-15 DIAGNOSIS — Z853 Personal history of malignant neoplasm of breast: Secondary | ICD-10-CM

## 2012-09-28 ENCOUNTER — Ambulatory Visit
Admission: RE | Admit: 2012-09-28 | Discharge: 2012-09-28 | Disposition: A | Discharge status: Home / Self Care | Payer: Medicare Other | Source: Ambulatory Visit | Attending: Internal Medicine | Admitting: Internal Medicine

## 2012-09-28 ENCOUNTER — Other Ambulatory Visit: Payer: Self-pay | Admitting: Internal Medicine

## 2012-09-28 DIAGNOSIS — Z853 Personal history of malignant neoplasm of breast: Secondary | ICD-10-CM

## 2012-10-06 DIAGNOSIS — Z7901 Long term (current) use of anticoagulants: Secondary | ICD-10-CM | POA: Diagnosis not present

## 2012-11-07 DIAGNOSIS — Z7901 Long term (current) use of anticoagulants: Secondary | ICD-10-CM | POA: Diagnosis not present

## 2012-11-07 DIAGNOSIS — I10 Essential (primary) hypertension: Secondary | ICD-10-CM | POA: Diagnosis not present

## 2012-11-07 DIAGNOSIS — I4891 Unspecified atrial fibrillation: Secondary | ICD-10-CM | POA: Diagnosis not present

## 2012-11-14 DIAGNOSIS — M1710 Unilateral primary osteoarthritis, unspecified knee: Secondary | ICD-10-CM | POA: Diagnosis not present

## 2012-11-24 DIAGNOSIS — D2370 Other benign neoplasm of skin of unspecified lower limb, including hip: Secondary | ICD-10-CM | POA: Diagnosis not present

## 2012-11-24 DIAGNOSIS — L739 Follicular disorder, unspecified: Secondary | ICD-10-CM | POA: Diagnosis not present

## 2012-11-24 DIAGNOSIS — D235 Other benign neoplasm of skin of trunk: Secondary | ICD-10-CM | POA: Diagnosis not present

## 2012-11-24 DIAGNOSIS — L820 Inflamed seborrheic keratosis: Secondary | ICD-10-CM | POA: Diagnosis not present

## 2012-11-28 ENCOUNTER — Encounter (HOSPITAL_COMMUNITY): Payer: Self-pay

## 2012-11-28 ENCOUNTER — Emergency Department (HOSPITAL_COMMUNITY): Payer: Medicare Other

## 2012-11-28 ENCOUNTER — Inpatient Hospital Stay (HOSPITAL_COMMUNITY)
Admission: EM | Admit: 2012-11-28 | Discharge: 2012-11-30 | DRG: 309 | Disposition: A | Discharge status: Home / Self Care | Payer: Medicare Other | Attending: Cardiology | Admitting: Cardiology

## 2012-11-28 DIAGNOSIS — D6859 Other primary thrombophilia: Secondary | ICD-10-CM | POA: Diagnosis not present

## 2012-11-28 DIAGNOSIS — D6851 Activated protein C resistance: Secondary | ICD-10-CM

## 2012-11-28 DIAGNOSIS — Z7901 Long term (current) use of anticoagulants: Secondary | ICD-10-CM

## 2012-11-28 DIAGNOSIS — R0789 Other chest pain: Secondary | ICD-10-CM | POA: Diagnosis present

## 2012-11-28 DIAGNOSIS — R079 Chest pain, unspecified: Secondary | ICD-10-CM | POA: Diagnosis not present

## 2012-11-28 DIAGNOSIS — I4891 Unspecified atrial fibrillation: Secondary | ICD-10-CM | POA: Diagnosis not present

## 2012-11-28 DIAGNOSIS — I495 Sick sinus syndrome: Secondary | ICD-10-CM | POA: Diagnosis not present

## 2012-11-28 DIAGNOSIS — I10 Essential (primary) hypertension: Secondary | ICD-10-CM | POA: Diagnosis not present

## 2012-11-28 DIAGNOSIS — I498 Other specified cardiac arrhythmias: Secondary | ICD-10-CM | POA: Diagnosis not present

## 2012-11-28 DIAGNOSIS — D7589 Other specified diseases of blood and blood-forming organs: Secondary | ICD-10-CM | POA: Diagnosis not present

## 2012-11-28 DIAGNOSIS — R001 Bradycardia, unspecified: Secondary | ICD-10-CM | POA: Diagnosis not present

## 2012-11-28 DIAGNOSIS — I499 Cardiac arrhythmia, unspecified: Secondary | ICD-10-CM | POA: Diagnosis not present

## 2012-11-28 HISTORY — DX: Activated protein C resistance: D68.51

## 2012-11-28 HISTORY — DX: Essential (primary) hypertension: I10

## 2012-11-28 LAB — CBC WITH DIFFERENTIAL/PLATELET
Basophils Absolute: 0 10*3/uL (ref 0.0–0.1)
Eosinophils Absolute: 0.1 10*3/uL (ref 0.0–0.7)
Eosinophils Relative: 1 % (ref 0–5)
Lymphocytes Relative: 42 % (ref 12–46)
MCH: 30.5 pg (ref 26.0–34.0)
MCV: 87.2 fL (ref 78.0–100.0)
Neutrophils Relative %: 48 % (ref 43–77)
Platelets: 185 10*3/uL (ref 150–400)
RDW: 12.4 % (ref 11.5–15.5)
WBC: 5.4 10*3/uL (ref 4.0–10.5)

## 2012-11-28 LAB — BASIC METABOLIC PANEL
Calcium: 9.2 mg/dL (ref 8.4–10.5)
GFR calc non Af Amer: 85 mL/min — ABNORMAL LOW (ref >90)
Potassium: 3.6 mEq/L (ref 3.5–5.1)
Sodium: 141 mEq/L (ref 135–145)

## 2012-11-28 LAB — PROTIME-INR
INR: 1.52 — ABNORMAL HIGH (ref 0.00–1.49)
Prothrombin Time: 17.9 seconds — ABNORMAL HIGH (ref 11.6–15.2)

## 2012-11-28 LAB — CK TOTAL AND CKMB (NOT AT ARMC)
CK, MB: 2 ng/mL (ref 0.3–4.0)
Total CK: 75 U/L (ref 7–177)

## 2012-11-28 MED ORDER — HEPARIN (PORCINE) IN NACL 100-0.45 UNIT/ML-% IJ SOLN: 16.0000 [IU]/kg/h | INTRAMUSCULAR | Status: DC

## 2012-11-28 MED ORDER — NITROGLYCERIN 0.4 MG SL SUBL: 0.4000 mg | SUBLINGUAL_TABLET | SUBLINGUAL | Status: DC | PRN

## 2012-11-28 MED ORDER — DILTIAZEM HCL 100 MG IV SOLR
5.0000 mg/h | INTRAVENOUS | Status: DC
  Administered 2012-11-28: 5 mg/h via INTRAVENOUS

## 2012-11-28 MED ORDER — ALPRAZOLAM 0.25 MG PO TABS: 0.2500 mg | ORAL_TABLET | Freq: Two times a day (BID) | ORAL | Status: DC | PRN

## 2012-11-28 MED ORDER — ZOLPIDEM TARTRATE 5 MG PO TABS: 5.0000 mg | ORAL_TABLET | Freq: Every evening | ORAL | Status: DC | PRN

## 2012-11-28 MED ORDER — WARFARIN - PHARMACIST DOSING INPATIENT: Freq: Every day | Status: DC

## 2012-11-28 MED ORDER — HEPARIN (PORCINE) IN NACL 100-0.45 UNIT/ML-% IJ SOLN
950.0000 [IU]/h | INTRAMUSCULAR | Status: DC
  Administered 2012-11-28 – 2012-11-29 (×2): 950 [IU]/h via INTRAVENOUS
  Filled 2012-11-28 (×2): qty 250

## 2012-11-28 MED ORDER — ONDANSETRON HCL 4 MG/2ML IJ SOLN: 4.0000 mg | Freq: Four times a day (QID) | INTRAMUSCULAR | Status: DC | PRN

## 2012-11-28 MED ORDER — METOPROLOL SUCCINATE ER 50 MG PO TB24
50.0000 mg | ORAL_TABLET | Freq: Every day | ORAL | Status: DC
  Filled 2012-11-28: qty 1

## 2012-11-28 MED ORDER — LEVOTHYROXINE SODIUM 75 MCG PO TABS
75.0000 ug | ORAL_TABLET | Freq: Every day | ORAL | Status: DC
  Administered 2012-11-29 – 2012-11-30 (×2): 75 ug via ORAL
  Filled 2012-11-28 (×3): qty 1

## 2012-11-28 MED ORDER — VENLAFAXINE HCL ER 75 MG PO CP24
75.0000 mg | ORAL_CAPSULE | Freq: Every day | ORAL | Status: DC
  Filled 2012-11-28 (×3): qty 1

## 2012-11-28 MED ORDER — DILTIAZEM HCL 30 MG PO TABS
30.0000 mg | ORAL_TABLET | Freq: Four times a day (QID) | ORAL | Status: DC
  Administered 2012-11-28: 30 mg via ORAL
  Filled 2012-11-28 (×6): qty 1

## 2012-11-28 MED ORDER — WARFARIN SODIUM 2.5 MG PO TABS: 2.5000 mg | ORAL_TABLET | Freq: Once | ORAL | Status: DC

## 2012-11-28 MED ORDER — ACETAMINOPHEN 325 MG PO TABS: 650.0000 mg | ORAL_TABLET | ORAL | Status: DC | PRN

## 2012-11-28 MED ORDER — HEPARIN BOLUS VIA INFUSION
1500.0000 [IU] | Freq: Once | INTRAVENOUS | Status: AC
  Administered 2012-11-28: 1500 [IU] via INTRAVENOUS

## 2012-11-28 MED ORDER — DILTIAZEM HCL 25 MG/5ML IV SOLN
10.0000 mg | Freq: Once | INTRAVENOUS | Status: AC
  Administered 2012-11-28: 10 mg via INTRAVENOUS
  Filled 2012-11-28: qty 5

## 2012-11-28 MED ORDER — WARFARIN SODIUM 5 MG PO TABS
5.0000 mg | ORAL_TABLET | Freq: Once | ORAL | Status: AC
  Administered 2012-11-28: 5 mg via ORAL
  Filled 2012-11-28: qty 1

## 2012-11-28 MED ORDER — WARFARIN SODIUM 2.5 MG PO TABS
2.5000 mg | ORAL_TABLET | Freq: Once | ORAL | Status: AC
  Administered 2012-11-28: 2.5 mg via ORAL
  Filled 2012-11-28: qty 1

## 2012-11-28 NOTE — H&P (Addendum)
Admit date: 11/28/2012 Referring Physician Dr. Oletta Cohn Primary Cardiologist Dr. Mayford Knife Chief complaint/reason for admission: palpitations, chest pain  HPI: 72 y/o woman with a h/o PAF.  She had palpitations start earlier today.  SHe has been in her usual state of health.  THe only change was that two weeks ago, she started eating healthier with the help of a nutritionist.  When she felt the palpitations this afternoon, there was some associated chest pressure.  She received IV Cardizem from EMS and in the ED.  Her heart rate was controlled.  Initial troponin was negative.  She converted to NSR just as she was being brought to her room.  SHe feels much better now.   Her INR was low.  SHe has been eating more greens.  She has been stable on coumadin in the past.  SHe has a h/o DVT/PE.      PMH:    Past Medical History  Diagnosis Date  . Atrial fibrillation with RVR   . Hypertension   . Factor 5 Leiden mutation, heterozygous     PSH:    Past Surgical History  Procedure Laterality Date  . Breast surgery    . Abdominal hysterectomy      ALLERGIES:   Review of patient's allergies indicates no known allergies.  Prior to Admit Meds:   Prescriptions prior to admission  Medication Sig Dispense Refill  . celecoxib (CELEBREX) 200 MG capsule Take 200 mg by mouth See admin instructions. 200mg  in the morning Monday through Friday. Does not take on the weekend      . desvenlafaxine (PRISTIQ) 50 MG 24 hr tablet Take 50 mg by mouth daily.      Marland Kitchen levothyroxine (SYNTHROID, LEVOTHROID) 75 MCG tablet Take 75 mcg by mouth daily before breakfast.      . metoprolol succinate (TOPROL-XL) 25 MG 24 hr tablet Take 25 mg by mouth daily.      Marland Kitchen warfarin (COUMADIN) 5 MG tablet Take 2.5-5 mg by mouth daily. 5mg  Monday, Wednesday, Friday; 2.5mg  Sunday, Tuesday, Thursday, Saturday       Family HX:   History reviewed. No pertinent family history. Social HX:    History   Social History  . Marital Status: Married     Spouse Name: N/A    Number of Children: N/A  . Years of Education: N/A   Occupational History  . Not on file.   Social History Main Topics  . Smoking status: Never Smoker   . Smokeless tobacco: Not on file  . Alcohol Use: Yes     Comment: social drinker  . Drug Use: Not on file  . Sexually Active: Not on file   Other Topics Concern  . Not on file   Social History Narrative  . No narrative on file     ROS:  All 11 ROS were addressed and are negative except what is stated in the HPI  PHYSICAL EXAM Filed Vitals:   11/28/12 2130  BP: 145/75  Pulse: 54  Temp: 97.4 F (36.3 C)  Resp: 18   General: Well developed, well nourished, in no acute distress Head:   Normal cephalic and atramatic  Lungs:   Clear bilaterally to auscultation and percussion. Heart:   HRRR S1 S2             No carotid bruit. No JVD. Abdomen:  abdomen soft and non-tender Msk:   Normal strength and tone for age. Extremities:   No edema.  DP +1 Neuro: Alert and  oriented X 3. Psych:  Normal affect, responds appropriately   Labs:   Lab Results  Component Value Date   WBC 5.4 11/28/2012   HGB 13.3 11/28/2012   HCT 38.0 11/28/2012   MCV 87.2 11/28/2012   PLT 185 11/28/2012    Recent Labs Lab 11/28/12 1754  NA 141  K 3.6  CL 104  CO2 28  BUN 17  CREATININE 0.73  CALCIUM 9.2  GLUCOSE 84   Lab Results  Component Value Date   CKTOTAL 75 11/28/2012   CKMB 2.0 11/28/2012   TROPONINI <0.30 11/28/2012   No results found for this basename: PTT   Lab Results  Component Value Date   INR 1.52* 11/28/2012   INR 0.93 08/05/2009   INR 1.0 09/19/2007     No results found for this basename: CHOL   No results found for this basename: HDL   No results found for this basename: LDLCALC   No results found for this basename: TRIG   No results found for this basename: CHOLHDL   No results found for this basename: LDLDIRECT      Radiology:  @RISRSLT24 @  EKG:  AFib with RVR, ST depressions  diffusely  CXR: No acute cardiopulmonary disease; aortic atherosclerosis. ASSESSMENT: AFib, chest discomfort  PLAN:  Now back in NSR.  Will add Cardizem 30 mg q6hours.  May need to change from beta blocker to diltiazem.    Heparin IV for stroke prevention.  Will give extra 2.5 mg of coumadin tonight since she was subtherapeutic.   R/O for MI with enzymes.  ECG changes and chest pressure were likely from the high rate.  Doubt ACS.  Initially planned for stepdown, but now is going to tele.    Longterm, will have to adjust coumadin to allow fro her to consume more greens with her healthy diet.  We discussed novel oral anticoagulants.  I am unsure if there is an indication for Factor V Leiden.  Corky Crafts., MD  11/28/2012  10:02 PM

## 2012-11-28 NOTE — ED Notes (Signed)
Per EMS, pt started feeling chest pressure in the center of her chest with no radiation.  Pt describes pain as "someone sitting on my chest".  EKG showed A. Fib with RVR.  Pt given 10 of Diltizem with EMS, HR in the 170's with no change. Nad upon arrival.

## 2012-11-28 NOTE — Progress Notes (Addendum)
ANTICOAGULATION CONSULT NOTE - Initial Consult  Pharmacy Consult for Heparin Indication: New onset Afib  No Known Allergies  Patient Measurements: Height: 5\' 6"  (167.6 cm) Weight: 145 lb (65.772 kg) IBW/kg (Calculated) : 59.3 Heparin Dosing Weight: 65.8 kg  Vital Signs: Temp: 98.1 F (36.7 C) (04/21 1734) Temp src: Oral (04/21 1734) BP: 113/74 mmHg (04/21 1900) Pulse Rate: 56 (04/21 1900)  Labs:  Recent Labs  11/28/12 1754 11/28/12 1755  HGB 13.3  --   HCT 38.0  --   PLT 185  --   LABPROT 17.9*  --   INR 1.52*  --   CREATININE 0.73  --   TROPONINI  --  <0.30    Estimated Creatinine Clearance: 61.3 ml/min (by C-G formula based on Cr of 0.73).   Medical History: Past Medical History  Diagnosis Date  . Atrial fibrillation with RVR   . Hypertension   . Factor 5 Leiden mutation, heterozygous     Assessment: 72 y.o. F who presented to the [facility] on 11/28/12 with an irregular heartbeat and chest comfort and was found to be in Afib. Pharmacy was consulted to start heparin for anticoagulation.  The patient was on warfarin PTA for a hx blood clots and Factor 5 Leiden mutation. PTA she was taking 2.5 mg daily EXCEPT for 5 mg on MWF. Her INR was recently checked ~1 week ago however she also has met with a dietitian in the past week and made some diet changes. Her INR on admission was SUBtherapeutic at 1.51.   The patient denies any recent surgeries, bleeding, or hx CVA. Hep Wt: 65.8 kg, baseline Hgb/Hct/Plt wnl. Will only give a half bolus since the patient's INR is already slightly elevated  Goal of Therapy:  Heparin level 0.3-0.7 units/ml Monitor platelets by anticoagulation protocol: Yes   Plan:  1. Heparin bolus of 1500 units x 1 2. Initiate heparin drip at a rate of 950 units/hr (9.5 ml/hr) 3. Warfarin 5 mg x 1 dose at 1800 today 4. Daily PT/INR, heparin levels 5. Will continue to monitor for any signs/symptoms of bleeding and will follow up with heparin level in 8  hours and PT/INR in the a.m  Georgina Pillion, PharmD, BCPS Clinical Pharmacist Pager: [phone] 11/28/2012 8:22 PM    MD ordered an additional dose of 2.5mg .

## 2012-11-28 NOTE — ED Provider Notes (Signed)
History     CSN: [id number]  Arrival date & time 11/28/12  1722   First MD Initiated Contact with Patient 11/28/12 1725      Chief Complaint  Patient presents with  . Chest Pain  . Atrial Fibrillation    (Consider location/radiation/quality/duration/timing/severity/associated sxs/prior treatment) HPI Comments: Patient comes to the ER for evaluation of irregular heartbeat and chest discomfort. Patient reports that symptoms began approximately an hour and a half ago. Patient has a history of atrial fibrillation. She reports that she generally knows when she goes in and out of it. It usually lasts for a few minutes. Patient started having accelerated irregular heart rate an hour and half ago and has noticed a heaviness in her chest. She reports that this has been waxing and waning. At times it feels like someone sitting on the chest, currently just a slight pressure. Patient was given 10 mg of IV Cardizem by EMS for a heart rate of 170. She has had minimal response so far.  Patient is a 72 y.o. female presenting with chest pain and atrial fibrillation.  Chest Pain Associated symptoms: palpitations   Atrial Fibrillation Associated symptoms include chest pain.    No past medical history on file.  No past surgical history on file.  No family history on file.  History  Substance Use Topics  . Smoking status: Not on file  . Smokeless tobacco: Not on file  . Alcohol Use: Not on file    OB History   No data available      Review of Systems  Cardiovascular: Positive for chest pain and palpitations.  All other systems reviewed and are negative.    Allergies  Review of patient's allergies indicates no known allergies.  Home Medications  No current outpatient prescriptions on file.  There were no vitals taken for this visit.  Physical Exam  Constitutional: She is oriented to person, place, and time. She appears well-developed and well-nourished. No distress.  HENT:   Head: Normocephalic and atraumatic.  Right Ear: Hearing normal.  Nose: Nose normal.  Mouth/Throat: Oropharynx is clear and moist and mucous membranes are normal.  Eyes: Conjunctivae and EOM are normal. Pupils are equal, round, and reactive to light.  Neck: Normal range of motion. Neck supple.  Cardiovascular: S1 normal, S2 normal and normal heart sounds.  An irregular rhythm present. Tachycardia present.  Exam reveals no gallop and no friction rub.   No murmur heard. Pulmonary/Chest: Effort normal and breath sounds normal. No respiratory distress. She exhibits no tenderness.  Abdominal: Soft. Normal appearance and bowel sounds are normal. There is no hepatosplenomegaly. There is no tenderness. There is no rebound, no guarding, no tenderness at McBurney's point and negative Murphy's sign. No hernia.  Musculoskeletal: Normal range of motion.  Neurological: She is alert and oriented to person, place, and time. She has normal strength. No cranial nerve deficit or sensory deficit. Coordination normal. GCS eye subscore is 4. GCS verbal subscore is 5. GCS motor subscore is 6.  Skin: Skin is warm, dry and intact. No rash noted. No cyanosis.  Psychiatric: She has a normal mood and affect. Her speech is normal and behavior is normal. Thought content normal.    ED Course  Procedures (including critical care time)  EKG:  Date: 11/28/2012  Rate: 128  Rhythm: atrial fibrillation  QRS Axis: normal  Intervals: normal  ST/T Wave abnormalities: ST depressions inferiorly and ST depressions laterally  Conduction Disutrbances:none  Narrative Interpretation:   Old EKG Reviewed:  ST depressions are new. They are diffuse and are likely rate related.    Labs Reviewed - No data to display No results found.   Diagnosis: AFIB with RVR    MDM  Presented to the ER with atrial fibrillation with rapid ventricular response. Patient was given 10 mg of diltiazem by EMS which helped the right but she was still  tachycardic. An additional bolus and drip achieved rate control, but she is still in atrial fibrillation. Dr. Eldridge Dace consultation to admit the patient for further management.        Jamie Crease, MD 12/02/12 1538

## 2012-11-29 DIAGNOSIS — I498 Other specified cardiac arrhythmias: Secondary | ICD-10-CM | POA: Diagnosis not present

## 2012-11-29 DIAGNOSIS — I4891 Unspecified atrial fibrillation: Secondary | ICD-10-CM | POA: Diagnosis not present

## 2012-11-29 DIAGNOSIS — R001 Bradycardia, unspecified: Secondary | ICD-10-CM | POA: Diagnosis not present

## 2012-11-29 DIAGNOSIS — R079 Chest pain, unspecified: Secondary | ICD-10-CM | POA: Diagnosis not present

## 2012-11-29 DIAGNOSIS — D6859 Other primary thrombophilia: Secondary | ICD-10-CM | POA: Diagnosis not present

## 2012-11-29 DIAGNOSIS — I10 Essential (primary) hypertension: Secondary | ICD-10-CM | POA: Diagnosis not present

## 2012-11-29 LAB — HEMOGLOBIN A1C
Hgb A1c MFr Bld: 5.5 % (ref <5.7)
Mean Plasma Glucose: 111 mg/dL (ref <117)

## 2012-11-29 LAB — TROPONIN I: Troponin I: <0.3 ng/mL (ref <0.30)

## 2012-11-29 LAB — HEPARIN LEVEL (UNFRACTIONATED)
Heparin Unfractionated: 0.42 IU/mL (ref 0.30–0.70)
Heparin Unfractionated: 0.53 IU/mL (ref 0.30–0.70)

## 2012-11-29 LAB — BASIC METABOLIC PANEL
CO2: 30 mEq/L (ref 19–32)
Chloride: 108 mEq/L (ref 96–112)
Creatinine, Ser: 0.75 mg/dL (ref 0.50–1.10)

## 2012-11-29 LAB — APTT: aPTT: 121 seconds — ABNORMAL HIGH (ref 24–37)

## 2012-11-29 LAB — CK TOTAL AND CKMB (NOT AT ARMC)
CK, MB: 1.7 ng/mL (ref 0.3–4.0)
Relative Index: INVALID (ref 0.0–2.5)
Total CK: 63 U/L (ref 7–177)

## 2012-11-29 LAB — PROTIME-INR: Prothrombin Time: 19.2 seconds — ABNORMAL HIGH (ref 11.6–15.2)

## 2012-11-29 MED ORDER — WARFARIN SODIUM 7.5 MG PO TABS
7.5000 mg | ORAL_TABLET | Freq: Once | ORAL | Status: AC
  Administered 2012-11-29: 7.5 mg via ORAL
  Filled 2012-11-29: qty 1

## 2012-11-29 NOTE — Progress Notes (Signed)
ANTICOAGULATION CONSULT NOTE - Follow Up Consult  Pharmacy Consult for Heparin Indication: New onset Afib  No Known Allergies  Patient Measurements: Height: 5\' 6"  (167.6 cm) Weight: 147 lb (66.679 kg) IBW/kg (Calculated) : 59.3 Heparin Dosing Weight: 65.8 kg  Vital Signs: Temp: 98.1 F (36.7 C) (04/22 0557) Temp src: Oral (04/22 0557) BP: 124/70 mmHg (04/22 0557) Pulse Rate: 52 (04/22 0557)  Labs:  Recent Labs  11/28/12 1754 11/28/12 1755 11/28/12 2020 11/28/12 2321 11/29/12 0148 11/29/12 0430 11/29/12 0450 11/29/12 0500  HGB 13.3  --   --   --   --   --   --   --   HCT 38.0  --   --   --   --   --   --   --   PLT 185  --   --   --   --   --   --   --   APTT  --   --   --  121*  --   --   --   --   LABPROT 17.9*  --   --  19.7*  --  19.2*  --   --   INR 1.52*  --   --  1.73*  --  1.68*  --   --   HEPARINUNFRC  --   --   --   --   --   --   --  0.42  CREATININE 0.73  --   --   --   --  0.75  --   --   CKTOTAL  --   --  75  --  63  --   --   --   CKMB  --   --  2.0  --  1.8  --   --   --   TROPONINI  --  <0.30  --  <0.30  --   --  <0.30  --     Estimated Creatinine Clearance: 61.3 ml/min (by C-G formula based on Cr of 0.75).   Medical History: Past Medical History  Diagnosis Date  . Atrial fibrillation with RVR   . Hypertension   . Factor 5 Leiden mutation, heterozygous   . Chest pain, unspecified     Assessment: 72 y.o. F who presented to the [facility] on 11/28/12 with an irregular heartbeat and chest comfort and was found to be in Afib. Pharmacy was consulted to start heparin for anticoagulation.  Initial heparin level therapeutic   Goal of Therapy:  Heparin level 0.3-0.7 units/ml Monitor platelets by anticoagulation protocol: Yes   Plan:  1. Cont heparin drip at a rate of 950 units/hr  2.  Recheck heparin level in 6 hours to confirm  Talbert Cage, PharmD Clinical Pharmacist Pager: [phone] 11/29/2012 7:00 AM

## 2012-11-29 NOTE — Progress Notes (Signed)
SUBJECTIVE:  No further atrial fibrillation - currently bradycardic  OBJECTIVE:   Vitals:   Filed Vitals:   11/28/12 2045 11/28/12 2130 11/29/12 0557 11/29/12 0924  BP: 117/77 145/75 124/70   Pulse: 47 54 52 50  Temp:  97.4 F (36.3 C) 98.1 F (36.7 C)   TempSrc:  Oral Oral   Resp: 21 18 16    Height:  5\' 6"  (1.676 m)    Weight:  66.679 kg (147 lb)    SpO2: 95% 99% 98%    I&O's:   Intake/Output Summary (Last 24 hours) at 11/29/12 1048 Last data filed at 11/29/12 0759  Gross per 24 hour  Intake    240 ml  Output      0 ml  Net    240 ml   TELEMETRY: Reviewed telemetry pt in sinus bradycardia     PHYSICAL EXAM General: Well developed, well nourished, in no acute distress Head: Eyes PERRLA, No xanthomas.   Normal cephalic and atramatic  Lungs:   Clear bilaterally to auscultation and percussion. Heart:   HRRR S1 S2 Pulses are 2+ & equal.  Abdomen: Bowel sounds are positive, abdomen soft and non-tender without masses Extremities:   No clubbing, cyanosis or edema.  DP +1 Neuro: Alert and oriented X 3. Psych:  Good affect, responds appropriately   LABS: Basic Metabolic Panel:  Recent Labs  16/10/96 1754 11/29/12 0430  NA 141 144  K 3.6 4.0  CL 104 108  CO2 28 30  GLUCOSE 84 87  BUN 17 14  CREATININE 0.73 0.75  CALCIUM 9.2 8.8   Liver Function Tests: No results found for this basename: AST, ALT, ALKPHOS, BILITOT, PROT, ALBUMIN,  in the last 72 hours No results found for this basename: LIPASE, AMYLASE,  in the last 72 hours CBC:  Recent Labs  11/28/12 1754  WBC 5.4  NEUTROABS 2.6  HGB 13.3  HCT 38.0  MCV 87.2  PLT 185   Cardiac Enzymes:  Recent Labs  11/28/12 2020 11/28/12 2321 11/29/12 0148 11/29/12 0450 11/29/12 0830 11/29/12 0850  CKTOTAL 75  --  63  --  56  --   CKMB 2.0  --  1.8  --  1.7  --   TROPONINI  --  <0.30  --  <0.30  --  <0.30   Coag Panel:   Lab Results  Component Value Date   INR 1.68* 11/29/2012   INR 1.73* 11/28/2012   INR 1.52* 11/28/2012    RADIOLOGY: Dg Chest Port 1 View  11/28/2012  *RADIOLOGY REPORT*  Clinical Data: Chest pain.  History of atrial fibrillation. Nonsmoker.  PORTABLE CHEST - 1 VIEW  Comparison: 07/31/2009  Findings: Numerous leads and wires project over the chest.  Midline trachea.  Patient rotated minimally left. Normal heart size. Aortic atherosclerosis. No pleural effusion or pneumothorax.  Clear lungs.  IMPRESSION: No acute cardiopulmonary disease.  Atherosclerosis.   Original Report Authenticated By: Jeronimo Greaves, M.D.       ASSESSMENT:  1.  PAF with no further afib 2.  Asymptomatic bradycardia but HR in 40's so cannot give beta blocker or CCB this am 3.  HTN 4.  Factor 5 Leiden mutation on systemic anticoagulation  PLAN:   1.  Will hold Toprol today and no further CCB 2.  If HR better will restart Toprol in am.  If she has any reoccurence of afib will need EP eval ? afib ablation 3.  Continue IV Heparin until INR therapuetic   Trygve Thal R,  MD  11/29/2012  10:48 AM

## 2012-11-29 NOTE — Progress Notes (Signed)
UR Completed Chayce Rullo Graves-Bigelow, RN,BSN [phone]  

## 2012-11-29 NOTE — Progress Notes (Signed)
Notified by CCMD pt had a 2.22 second pause at 0243. Upon further review of alarms --pt also had a non conducted PJC at 0246.  Pt is resting quietly/ no distress noted. Please see saved strips. Will continue to monitor. Dierdre Highman

## 2012-11-29 NOTE — Progress Notes (Signed)
ANTICOAGULATION CONSULT NOTE - Follow Up Consult  Pharmacy Consult for Heparin and Coumadin Indication: atrial fibrillation  No Known Allergies  Patient Measurements: Height: 5\' 6"  (167.6 cm) Weight: 147 lb (66.679 kg) IBW/kg (Calculated) : 59.3 Heparin Dosing Weight: 66 kg  Vital Signs: Temp: 98.1 F (36.7 C) (04/22 1327) Temp src: Oral (04/22 1327) BP: 126/72 mmHg (04/22 1327) Pulse Rate: 60 (04/22 1327)  Labs:  Recent Labs  11/28/12 1754  11/28/12 2020 11/28/12 2321 11/29/12 0148 11/29/12 0430 11/29/12 0450 11/29/12 0500 11/29/12 0830 11/29/12 0850 11/29/12 1302  HGB 13.3  --   --   --   --   --   --   --   --   --   --   HCT 38.0  --   --   --   --   --   --   --   --   --   --   PLT 185  --   --   --   --   --   --   --   --   --   --   APTT  --   --   --  121*  --   --   --   --   --   --   --   LABPROT 17.9*  --   --  19.7*  --  19.2*  --   --   --   --   --   INR 1.52*  --   --  1.73*  --  1.68*  --   --   --   --   --   HEPARINUNFRC  --   --   --   --   --   --   --  0.42  --   --  0.53  CREATININE 0.73  --   --   --   --  0.75  --   --   --   --   --   CKTOTAL  --   --  75  --  63  --   --   --  56  --   --   CKMB  --   --  2.0  --  1.8  --   --   --  1.7  --   --   TROPONINI  --   < >  --  <0.30  --   --  <0.30  --   --  <0.30  --   < > = values in this interval not displayed.  Estimated Creatinine Clearance: 61.3 ml/min (by C-G formula based on Cr of 0.75).   Medications:  Scheduled:  . [COMPLETED] diltiazem  10 mg Intravenous Once  . [COMPLETED] heparin  1,500 Units Intravenous Once  . levothyroxine  75 mcg Oral QAC breakfast  . venlafaxine XR  75 mg Oral Q breakfast  . [COMPLETED] warfarin  2.5 mg Oral Once  . [COMPLETED] warfarin  5 mg Oral Once  . Warfarin - Pharmacist Dosing Inpatient   Does not apply q1800  . [DISCONTINUED] diltiazem  30 mg Oral Q6H  . [DISCONTINUED] metoprolol succinate  50 mg Oral Daily  . [DISCONTINUED] warfarin  2.5  mg Oral ONCE-1800   Infusions:  . heparin 950 Units/hr (11/28/12 2105)  . [DISCONTINUED] diltiazem (CARDIZEM) infusion 5 mg/hr (11/28/12 1802)  . [DISCONTINUED] heparin      Assessment: 72 yo F presented to ED with Afib and continues  on anticoagulation.  Pt was on Coumadin PTA but INR was subtherapeutic on admission.  Heparin was initiated for bridging.  Heparin is therapeutic on 950 units/hr.  INR still subtherapeutic.   Goal of Therapy:  INR 2-3 Heparin level 0.3-0.7 units/ml Monitor platelets by anticoagulation protocol: Yes   Plan:  Continue heparin at 950 units/hr. Repeat Coumadin 7.5 mg PO x 1 tonight. Continue daily Heparin level, CBC, and INR.  [employer], Pharm.D., BCPS Clinical Pharmacist Pager [phone] 11/29/2012 2:38 PM

## 2012-11-30 DIAGNOSIS — I4891 Unspecified atrial fibrillation: Secondary | ICD-10-CM | POA: Diagnosis not present

## 2012-11-30 DIAGNOSIS — I10 Essential (primary) hypertension: Secondary | ICD-10-CM | POA: Diagnosis not present

## 2012-11-30 DIAGNOSIS — I495 Sick sinus syndrome: Secondary | ICD-10-CM

## 2012-11-30 DIAGNOSIS — D6859 Other primary thrombophilia: Secondary | ICD-10-CM | POA: Diagnosis not present

## 2012-11-30 DIAGNOSIS — I498 Other specified cardiac arrhythmias: Secondary | ICD-10-CM | POA: Diagnosis not present

## 2012-11-30 DIAGNOSIS — R079 Chest pain, unspecified: Secondary | ICD-10-CM | POA: Diagnosis not present

## 2012-11-30 LAB — HEPARIN LEVEL (UNFRACTIONATED): Heparin Unfractionated: 0.7 IU/mL (ref 0.30–0.70)

## 2012-11-30 LAB — PROTIME-INR: Prothrombin Time: 25.4 seconds — ABNORMAL HIGH (ref 11.6–15.2)

## 2012-11-30 MED ORDER — WARFARIN SODIUM 2.5 MG PO TABS
2.5000 mg | ORAL_TABLET | Freq: Once | ORAL | Status: AC
  Administered 2012-11-30: 2.5 mg via ORAL
  Filled 2012-11-30: qty 1

## 2012-11-30 MED ORDER — WARFARIN SODIUM 5 MG PO TABS: ORAL_TABLET | ORAL | Status: DC

## 2012-11-30 MED ORDER — METOPROLOL TARTRATE 25 MG PO TABS: ORAL_TABLET | ORAL | Status: DC

## 2012-11-30 NOTE — Consult Note (Signed)
ELECTROPHYSIOLOGY CONSULT NOTE  Patient ID: ZAKIAH GAUTHREAUX MRN: [id number], DOB/AGE: September 11, 1940   Admit date: 11/28/2012 Date of Consult: 11/30/2012  Primary Physician: Levy Sjogren, MD Primary Cardiologist: Mayford Knife, MD Reason for Consultation: Atrial fibrillation  History of Present Illness Ms. Schlafer is a 72 year old woman with PAF, HTN and Factor V Leiden deficiency on chronic anticoagulation who presented 11/28/2012 with complaints of CP and palpitations, found to have rapid AF. She was started on IV diltiazem and IV heparin due to subtherapeutic INR. She spontaneously converted to SR shortly after admission. IV diltiazem was stopped. She was continued on her usual Toprol XL regimen. She remains in SR but with rates in the upper 40s to 50s. Her BB has been held, last dose given yesterday at 10 AM. She remains bradycardic; therefore, we have been asked to see her in consultation for EP recommendations.   Ms. Malan reports being diagnosed with AF ~10 years ago. She has been hospitalized twice with rapid AF over the last decade. She reports several brief episodes over the last 6 months but overall feels her episodes seem to be more symptomatic. She now has chest "pressure" racing palpitations, SOB and lightheadedness. She denies exertional CP or SOB. She denies near syncope or syncope. She denies history of CAD/MI, valvular heart disease or HF. She denies recent illness, fever or chills. She denies any recent changes to her medications or increased caffeine intake.  Past Medical History Past Medical History  Diagnosis Date  . Atrial fibrillation with RVR   . Hypertension   . Factor 5 Leiden mutation, heterozygous   . Chest pain, unspecified     Past Surgical History Past Surgical History  Procedure Laterality Date  . Breast surgery    . Abdominal hysterectomy      Allergies/Intolerances No Known Allergies  Inpatient Medications . levothyroxine  75 mcg Oral QAC breakfast  .  venlafaxine XR  75 mg Oral Q breakfast  . warfarin  2.5 mg Oral ONCE-1800  . Warfarin - Pharmacist Dosing Inpatient   Does not apply q1800   Family History Positive for CAD and AF  Social History Social History  . Marital Status: Married   Social History Main Topics  . Smoking status: Never Smoker   . Smokeless tobacco: No  . Alcohol Use: Yes     Comment: social drinker  . Drug Use: No   Review of Systems General: No chills, fever, night sweats or weight changes  Cardiovascular:  No chest pain, dyspnea on exertion, edema, orthopnea, palpitations, paroxysmal nocturnal dyspnea Dermatological: No rash, lesions or masses Respiratory: No cough, dyspnea Urologic: No hematuria, dysuria Abdominal: No nausea, vomiting, diarrhea, bright red blood per rectum, melena, or hematemesis Neurologic: No visual changes, weakness, changes in mental status All other systems reviewed and are otherwise negative except as noted above.  Physical Exam Blood pressure 153/82, pulse 56, temperature 97.6 F (36.4 C), temperature source Oral, resp. rate 18, height 5\' 6"  (1.676 m), weight 147 lb (66.679 kg), SpO2 98.00%.  General: Well developed, well appearing 72 year old female in no acute distress. HEENT: Normocephalic, atraumatic. EOMs intact. Sclera nonicteric. Oropharynx clear.  Neck: Supple. No JVD. Lungs: Respirations regular and unlabored, CTA bilaterally. No wheezes, rales or rhonchi. Heart: RRR. S1, S2 present. No murmurs, rub, S3 or S4. Abdomen: Soft, non-tender, non-distended. BS present x 4 quadrants. No hepatosplenomegaly.  Extremities: No clubbing, cyanosis or edema. DP/PT/Radials 2+ and equal bilaterally. Psych: Normal affect. Neuro: Alert and oriented X  3. Moves all extremities spontaneously. Musculoskeletal: No kyphosis. Skin: Intact. Warm and dry. No rashes or petechiae in exposed areas.   Labs  Recent Labs  11/28/12 1755 11/28/12 2020 11/28/12 2321 11/29/12 0148 11/29/12 0450  11/29/12 0830 11/29/12 0850  CKTOTAL  --  75  --  63  --  56  --   CKMB  --  2.0  --  1.8  --  1.7  --   TROPONINI <0.30  --  <0.30  --  <0.30  --  <0.30   Lab Results  Component Value Date   WBC 5.4 11/28/2012   HGB 13.3 11/28/2012   HCT 38.0 11/28/2012   MCV 87.2 11/28/2012   PLT 185 11/28/2012    Recent Labs Lab 11/29/12 0430  NA 144  K 4.0  CL 108  CO2 30  BUN 14  CREATININE 0.75  CALCIUM 8.8  GLUCOSE 87   No results found for this basename: TSH, T4TOTAL, FREET3, T3FREE, THYROIDAB,  in the last 72 hours  Recent Labs  11/30/12 0525  INR 2.44*    Radiology/Studies Dg Chest Port 1 View  11/28/2012  *RADIOLOGY REPORT*  Clinical Data: Chest pain.  History of atrial fibrillation. Nonsmoker.  PORTABLE CHEST - 1 VIEW  Comparison: 07/31/2009  Findings: Numerous leads and wires project over the chest.  Midline trachea.  Patient rotated minimally left. Normal heart size. Aortic atherosclerosis. No pleural effusion or pneumothorax.  Clear lungs.  IMPRESSION: No acute cardiopulmonary disease.  Atherosclerosis.   Original Report Authenticated By: Jeronimo Greaves, M.D.     Echocardiogram  No recent echo in EPIC; last assessment done in 2007  12-lead ECG on presentation shows rapid AF at 128 bpm; mild "scooping" ST depression anterolaterally Telemetry shows sinus rhythm at 56 bpm currently; no further significant bradycardia since off AV nodal medications; no AV block or pauses   Assessment and Plan 1. PAF - symptomatic with CP, SOB and palpitations; discussed multiple treatment options with Ms. Melching and her husband in detail including medical therapy, rate vs rhythm control and the procedural approach of AF ablation 2. Sinus bradycardia - in setting of IV diltiazem and PO metoprolol succinate over the last 48 hours; has improved with discontinuation of AV nodal blocking meds; may be limiting factor in optimizing her medical therapy for AF 3. History of normal LV function - last echo  2007 here; will need to update this to evaluate LA size and LV function; if mod-severe LA dilatation, most likely not a good candidate for AF ablation 4. Factor V Leiden deficiency - on chronic anticoagulation with warfarin   Dr. Graciela Husbands to see Signed, Rick Duff, PA-C 11/30/2012, 3:09 PM  The patient has infrequent symptomatic atrial fibrillation. She's had chronic fatigue and lethargy which she wonders whether may be attributable l to her metoprolol succinate. She has seen sleep doctors a couple of times most recently a half dozen years ago or so. At least on one occasion during a sleep study she had atrial fibrillation.  Given the infrequency of her episodes I would favor discontinuing her metoprolol and using either on a when necessary basis or considering the use of flecainide on a when necessary basis. I would not undertake antiarrhythmic therapy or catheter ablation at this time.  She has thromboembolic risk factors notable for age gender and hypertension for a CHADS-VASc score of 3. She's been on Coumadin for a long time in the setting of factor V Leiden and the DVT that occurred in the  context of knee surgery. I would defer to hematology as to whether Coumadin versus a NOAC is a more appropriate therapy  She has hypertension and would need therapy; I would favor ACE inhibitor; in the event that she has persistent sinus bradycardia, if it were deemed to be symptomatic, consideration could be given to pacing.  We'll be glad to see in the future as needed. Thank you for the consultation

## 2012-11-30 NOTE — Progress Notes (Addendum)
SUBJECTIVE:  No complaints but HR still in the 50's and low 60's with ambulation with some lightheadedness  OBJECTIVE:   Vitals:   Filed Vitals:   11/29/12 1327 11/29/12 2009 11/30/12 0521 11/30/12 1315  BP: 126/72 124/69 142/74 153/82  Pulse: 60 61 52 56  Temp: 98.1 F (36.7 C) 98.1 F (36.7 C) 97.7 F (36.5 C) 97.6 F (36.4 C)  TempSrc: Oral Oral Oral Oral  Resp: 18   18  Height:      Weight:   66.679 kg (147 lb)   SpO2: 96% 97% 96% 98%   I&O's:   Intake/Output Summary (Last 24 hours) at 11/30/12 1355 Last data filed at 11/30/12 0830  Gross per 24 hour  Intake    600 ml  Output      0 ml  Net    600 ml   TELEMETRY: Reviewed telemetry pt in sinus bradycardia:     PHYSICAL EXAM General: Well developed, well nourished, in no acute distress Head: Eyes PERRLA, No xanthomas.   Normal cephalic and atramatic  Lungs:   Clear bilaterally to auscultation and percussion. Heart:   HRRR S1 S2 Pulses are 2+ & equal. Abdomen: Bowel sounds are positive, abdomen soft and non-tender without masses Extremities:   No clubbing, cyanosis or edema.  DP +1 Neuro: Alert and oriented X 3. Psych:  Good affect, responds appropriately   LABS: Basic Metabolic Panel:  Recent Labs  16/10/96 1754 11/29/12 0430  NA 141 144  K 3.6 4.0  CL 104 108  CO2 28 30  GLUCOSE 84 87  BUN 17 14  CREATININE 0.73 0.75  CALCIUM 9.2 8.8   Liver Function Tests: No results found for this basename: AST, ALT, ALKPHOS, BILITOT, PROT, ALBUMIN,  in the last 72 hours No results found for this basename: LIPASE, AMYLASE,  in the last 72 hours CBC:  Recent Labs  11/28/12 1754  WBC 5.4  NEUTROABS 2.6  HGB 13.3  HCT 38.0  MCV 87.2  PLT 185   Cardiac Enzymes:  Recent Labs  11/28/12 2020 11/28/12 2321 11/29/12 0148 11/29/12 0450 11/29/12 0830 11/29/12 0850  CKTOTAL 75  --  63  --  56  --   CKMB 2.0  --  1.8  --  1.7  --   TROPONINI  --  <0.30  --  <0.30  --  <0.30   BNP: No components found  with this basename: POCBNP,  D-Dimer: No results found for this basename: DDIMER,  in the last 72 hours Hemoglobin A1C:  Recent Labs  11/28/12 2321  HGBA1C 5.5   Fasting Lipid Panel: No results found for this basename: CHOL, HDL, LDLCALC, TRIG, CHOLHDL, LDLDIRECT,  in the last 72 hours Thyroid Function Tests: No results found for this basename: TSH, T4TOTAL, FREET3, T3FREE, THYROIDAB,  in the last 72 hours Anemia Panel: No results found for this basename: VITAMINB12, FOLATE, FERRITIN, TIBC, IRON, RETICCTPCT,  in the last 72 hours Coag Panel:   Lab Results  Component Value Date   INR 2.44* 11/30/2012   INR 1.68* 11/29/2012   INR 1.73* 11/28/2012    RADIOLOGY: Dg Chest Port 1 View  11/28/2012  *RADIOLOGY REPORT*  Clinical Data: Chest pain.  History of atrial fibrillation. Nonsmoker.  PORTABLE CHEST - 1 VIEW  Comparison: 07/31/2009  Findings: Numerous leads and wires project over the chest.  Midline trachea.  Patient rotated minimally left. Normal heart size. Aortic atherosclerosis. No pleural effusion or pneumothorax.  Clear lungs.  IMPRESSION: No  acute cardiopulmonary disease.  Atherosclerosis.   Original Report Authenticated By: Jeronimo Greaves, M.D.     ASSESSMENT:  1. PAF with no further afib  2. symptomatic bradycardia with some lightheadedness but HR in 50's so cannot give beta blocker or CCB  3. HTN  4. Factor 5 Leiden mutation on systemic anticoagulation  PLAN:  1. Will hold Toprol today  2. EP consult for treatment options for afib - Tikosyn vs. afib ablation 3. IV Heparin gtt stopped due to therapeutic INR     Quintella Reichert, MD  11/30/2012  1:55 PM

## 2012-11-30 NOTE — Progress Notes (Signed)
Pt discharged home pr order. D/C instructions review with pt and all questions answered.

## 2012-11-30 NOTE — Progress Notes (Signed)
ANTICOAGULATION CONSULT NOTE - Follow Up Consult  Pharmacy Consult for Heparin and Coumadin Indication: atrial fibrillation  No Known Allergies  Patient Measurements: Height: 5\' 6"  (167.6 cm) Weight: 147 lb (66.679 kg) IBW/kg (Calculated) : 59.3 Heparin Dosing Weight: 66 kg  Vital Signs: Temp: 97.7 F (36.5 C) (04/23 0521) Temp src: Oral (04/23 0521) BP: 142/74 mmHg (04/23 0521) Pulse Rate: 52 (04/23 0521)  Labs:  Recent Labs  11/28/12 1754  11/28/12 2020 11/28/12 2321 11/29/12 0148 11/29/12 0430 11/29/12 0450 11/29/12 0500 11/29/12 0830 11/29/12 0850 11/29/12 1302 11/30/12 0525  HGB 13.3  --   --   --   --   --   --   --   --   --   --   --   HCT 38.0  --   --   --   --   --   --   --   --   --   --   --   PLT 185  --   --   --   --   --   --   --   --   --   --   --   APTT  --   --   --  121*  --   --   --   --   --   --   --   --   LABPROT 17.9*  --   --  19.7*  --  19.2*  --   --   --   --   --  25.4*  INR 1.52*  --   --  1.73*  --  1.68*  --   --   --   --   --  2.44*  HEPARINUNFRC  --   --   --   --   --   --   --  0.42  --   --  0.53 0.70  CREATININE 0.73  --   --   --   --  0.75  --   --   --   --   --   --   CKTOTAL  --   --  75  --  63  --   --   --  56  --   --   --   CKMB  --   --  2.0  --  1.8  --   --   --  1.7  --   --   --   TROPONINI  --   < >  --  <0.30  --   --  <0.30  --   --  <0.30  --   --   < > = values in this interval not displayed.  Estimated Creatinine Clearance: 61.3 ml/min (by C-G formula based on Cr of 0.75).   Medications:  Scheduled:  . levothyroxine  75 mcg Oral QAC breakfast  . venlafaxine XR  75 mg Oral Q breakfast  . [COMPLETED] warfarin  7.5 mg Oral ONCE-1800  . Warfarin - Pharmacist Dosing Inpatient   Does not apply q1800  . [DISCONTINUED] diltiazem  30 mg Oral Q6H  . [DISCONTINUED] metoprolol succinate  50 mg Oral Daily   Infusions:  . heparin 950 Units/hr (11/29/12 2153)    Assessment: 72 yo F presented to ED  with Afib and continues on anticoagulation.  Pt was on Coumadin PTA but INR was subtherapeutic on admission.  Heparin was initiated for bridging.  INR has  jumped 1.68 >> 2.44 after 2 "boosted" doses of Coumadin.  Will d/c heparin with therapeutic INR.  Will reduce Coumadin dose tonight after INR jump.  Can likely resume home Coumadin regimen with close monitoring at discharge.    Goal of Therapy:  INR 2-3 Heparin level 0.3-0.7 units/ml Monitor platelets by anticoagulation protocol: Yes   Plan:  D/C heparin Coumadin 2.5mg  PO x 1 tonight. Continue daily INR.  [employer], Pharm.D., BCPS Clinical Pharmacist Pager [phone] 11/30/2012 9:56 AM

## 2012-12-01 NOTE — Discharge Summary (Addendum)
Patient ID: Jamie Coffey MRN: [id number] DOB/AGE: 1941/06/26 72 y.o.  Admit date: 11/28/2012 Discharge date: 12/01/2012  Primary Discharge Diagnosis Atrial fibrillation with RVR  Secondary Discharge Diagnosis  Hypertension  Factor 5 Leiden mutation, heterozygous  Consults: cardiology - EP  Hospital Course: This is a 72yo WF with a history of HTN, PA and Factor 5 Leiden deficiency who presented to the ER with complaints of palpitations associated with mild chest pressure.  In the ER she was found to have afib with RVR and was given IV Cardizem by EMS.  She converted to NSR.  She ruled out for MI by serial cardiac enzymes.  She was noted to have a subtherapeutic INR felt due to a recent change in her diet.  She was started on IV Heparin gtt and coumadin was managed by pharmacy.  SHe initially was started on PO Cardizem but due to bradycardia her beta blocker and CCB were stopped.  An EP consult was obtained and it was recommended that she be sent home on PRN beta blocker and change to a different type of BP med if needed for BP control.  It was felt that ablation was not indicated at this time since her episodes of PAF are very infrequent.  SHe was discharged home in stable condition.  For workup of her chest pain she will be set up for an outpatient Lexiscan cardiolyte.   Discharge Exam: Blood pressure 136/72, pulse 59, temperature 98.1 F (36.7 C), temperature source Oral, resp. rate 18, height 5\' 6"  (1.676 m), weight 66.679 kg (147 lb), SpO2 99.00%.   General appearance: alert Eyes: conjunctivae/corneas clear. PERRL, EOM's intact. Fundi benign. Resp: clear to auscultation bilaterally COR:  RRR no M/R/G GI: NT, ND with active BS Extremities: extremities normal, atraumatic, no cyanosis or edema Labs:   Lab Results  Component Value Date   WBC 5.4 11/28/2012   HGB 13.3 11/28/2012   HCT 38.0 11/28/2012   MCV 87.2 11/28/2012   PLT 185 11/28/2012    Recent Labs Lab 11/29/12 0430  NA 144   K 4.0  CL 108  CO2 30  BUN 14  CREATININE 0.75  CALCIUM 8.8  GLUCOSE 87   Lab Results  Component Value Date   CKTOTAL 56 11/29/2012   CKMB 1.7 11/29/2012   TROPONINI <0.30 11/29/2012         Radiology: *RADIOLOGY REPORT*  Clinical Data: Chest pain. History of atrial fibrillation.  Nonsmoker.  PORTABLE CHEST - 1 VIEW  Comparison: 07/31/2009  Findings: Numerous leads and wires project over the chest. Midline  trachea. Patient rotated minimally left. Normal heart size.  Aortic atherosclerosis. No pleural effusion or pneumothorax. Clear  lungs.  IMPRESSION:  No acute cardiopulmonary disease.  Atherosclerosis.  Original Report Authenticated By: Jeronimo Greaves, M.D.  EKG: NSR with nonspecific ST abnormality FOLLOW UP PLANS AND APPOINTMENTS Discharge Orders   Future Orders Complete By Expires     Diet - low sodium heart healthy  As directed     Increase activity slowly  As directed         Medication List    STOP taking these medications       metoprolol succinate 25 MG 24 hr tablet  Commonly known as:  TOPROL-XL      TAKE these medications       celecoxib 200 MG capsule  Commonly known as:  CELEBREX  Take 200 mg by mouth See admin instructions. 200mg  in the morning Monday through Friday. Does not take  on the weekend     desvenlafaxine 50 MG 24 hr tablet  Commonly known as:  PRISTIQ  Take 50 mg by mouth daily.     levothyroxine 75 MCG tablet  Commonly known as:  SYNTHROID, LEVOTHROID  Take 75 mcg by mouth daily before breakfast.     metoprolol tartrate 25 MG tablet  Commonly known as:  LOPRESSOR  Take 1/2 to 1 tablet as needed for palpitations lasting more than 30 minutes     warfarin 5 MG tablet  Commonly known as:  COUMADIN  5mg  Monday, Tuesday,  Friday; Saturday and 2.5mg  Sunday, Wednesday, Thursday           Follow-up Information   Schedule an appointment as soon as possible for a visit with Quintella Reichert, MD. (appointment in 2 weeks)    Contact  information:   [address] [place] [address] [phone]       Follow up with Quintella Reichert, MD. (call for an appointment to be seen by Alfonse Ras PharmD on Friday 4/25)    Contact information:   [address] [place] [address] [phone]       BRING ALL MEDICATIONS WITH YOU TO FOLLOW UP APPOINTMENTS  Time spent with patient to include physician time:40 minutes Signed: Quintella Reichert 12/01/2012, 6:55 PM

## 2012-12-02 DIAGNOSIS — Z7901 Long term (current) use of anticoagulants: Secondary | ICD-10-CM | POA: Diagnosis not present

## 2012-12-02 DIAGNOSIS — I4891 Unspecified atrial fibrillation: Secondary | ICD-10-CM | POA: Diagnosis not present

## 2012-12-06 DIAGNOSIS — I4891 Unspecified atrial fibrillation: Secondary | ICD-10-CM | POA: Diagnosis not present

## 2012-12-06 DIAGNOSIS — Z7901 Long term (current) use of anticoagulants: Secondary | ICD-10-CM | POA: Diagnosis not present

## 2012-12-06 DIAGNOSIS — R079 Chest pain, unspecified: Secondary | ICD-10-CM | POA: Diagnosis not present

## 2012-12-15 DIAGNOSIS — I4891 Unspecified atrial fibrillation: Secondary | ICD-10-CM | POA: Diagnosis not present

## 2012-12-15 DIAGNOSIS — I10 Essential (primary) hypertension: Secondary | ICD-10-CM | POA: Diagnosis not present

## 2012-12-28 DIAGNOSIS — F4489 Other dissociative and conversion disorders: Secondary | ICD-10-CM | POA: Diagnosis not present

## 2012-12-28 DIAGNOSIS — IMO0002 Reserved for concepts with insufficient information to code with codable children: Secondary | ICD-10-CM | POA: Diagnosis not present

## 2012-12-28 DIAGNOSIS — E785 Hyperlipidemia, unspecified: Secondary | ICD-10-CM | POA: Diagnosis not present

## 2012-12-28 DIAGNOSIS — G2581 Restless legs syndrome: Secondary | ICD-10-CM | POA: Diagnosis not present

## 2012-12-28 DIAGNOSIS — E039 Hypothyroidism, unspecified: Secondary | ICD-10-CM | POA: Diagnosis not present

## 2012-12-28 DIAGNOSIS — I10 Essential (primary) hypertension: Secondary | ICD-10-CM | POA: Diagnosis not present

## 2013-01-06 DIAGNOSIS — I4891 Unspecified atrial fibrillation: Secondary | ICD-10-CM | POA: Diagnosis not present

## 2013-04-12 DIAGNOSIS — R413 Other amnesia: Secondary | ICD-10-CM | POA: Diagnosis not present

## 2013-05-09 ENCOUNTER — Other Ambulatory Visit: Payer: Self-pay | Admitting: Cardiology

## 2013-05-09 DIAGNOSIS — I4891 Unspecified atrial fibrillation: Secondary | ICD-10-CM

## 2013-05-09 DIAGNOSIS — I10 Essential (primary) hypertension: Secondary | ICD-10-CM | POA: Diagnosis not present

## 2013-05-09 DIAGNOSIS — Z79899 Other long term (current) drug therapy: Secondary | ICD-10-CM

## 2013-06-09 DIAGNOSIS — H2510 Age-related nuclear cataract, unspecified eye: Secondary | ICD-10-CM | POA: Diagnosis not present

## 2013-06-09 DIAGNOSIS — H43819 Vitreous degeneration, unspecified eye: Secondary | ICD-10-CM | POA: Diagnosis not present

## 2013-06-09 DIAGNOSIS — H524 Presbyopia: Secondary | ICD-10-CM | POA: Diagnosis not present

## 2013-06-09 DIAGNOSIS — H25019 Cortical age-related cataract, unspecified eye: Secondary | ICD-10-CM | POA: Diagnosis not present

## 2013-06-15 ENCOUNTER — Other Ambulatory Visit: Payer: Medicare Other

## 2013-06-15 ENCOUNTER — Ambulatory Visit: Payer: Medicare Other | Admitting: Cardiology

## 2013-06-28 DIAGNOSIS — H2510 Age-related nuclear cataract, unspecified eye: Secondary | ICD-10-CM | POA: Diagnosis not present

## 2013-06-28 DIAGNOSIS — H25019 Cortical age-related cataract, unspecified eye: Secondary | ICD-10-CM | POA: Diagnosis not present

## 2013-06-28 DIAGNOSIS — H2589 Other age-related cataract: Secondary | ICD-10-CM | POA: Diagnosis not present

## 2013-06-30 ENCOUNTER — Other Ambulatory Visit: Payer: Self-pay | Admitting: Cardiology

## 2013-07-05 ENCOUNTER — Encounter: Payer: Self-pay | Admitting: Cardiology

## 2013-07-05 DIAGNOSIS — I10 Essential (primary) hypertension: Secondary | ICD-10-CM | POA: Diagnosis not present

## 2013-07-05 DIAGNOSIS — E039 Hypothyroidism, unspecified: Secondary | ICD-10-CM | POA: Diagnosis not present

## 2013-07-05 DIAGNOSIS — R809 Proteinuria, unspecified: Secondary | ICD-10-CM | POA: Diagnosis not present

## 2013-07-05 DIAGNOSIS — R82998 Other abnormal findings in urine: Secondary | ICD-10-CM | POA: Diagnosis not present

## 2013-07-05 DIAGNOSIS — E785 Hyperlipidemia, unspecified: Secondary | ICD-10-CM | POA: Diagnosis not present

## 2013-07-12 DIAGNOSIS — M545 Low back pain: Secondary | ICD-10-CM | POA: Diagnosis not present

## 2013-07-12 DIAGNOSIS — Z0000 Encounter for general adult medical examination without abnormal findings: Secondary | ICD-10-CM | POA: Diagnosis not present

## 2013-07-12 DIAGNOSIS — E039 Hypothyroidism, unspecified: Secondary | ICD-10-CM | POA: Diagnosis not present

## 2013-07-12 DIAGNOSIS — I10 Essential (primary) hypertension: Secondary | ICD-10-CM | POA: Diagnosis not present

## 2013-07-12 DIAGNOSIS — Z1331 Encounter for screening for depression: Secondary | ICD-10-CM | POA: Diagnosis not present

## 2013-07-12 DIAGNOSIS — F329 Major depressive disorder, single episode, unspecified: Secondary | ICD-10-CM | POA: Diagnosis not present

## 2013-07-12 DIAGNOSIS — E785 Hyperlipidemia, unspecified: Secondary | ICD-10-CM | POA: Diagnosis not present

## 2013-07-12 DIAGNOSIS — M1710 Unilateral primary osteoarthritis, unspecified knee: Secondary | ICD-10-CM | POA: Diagnosis not present

## 2013-07-12 DIAGNOSIS — G2581 Restless legs syndrome: Secondary | ICD-10-CM | POA: Diagnosis not present

## 2013-09-13 DIAGNOSIS — H25019 Cortical age-related cataract, unspecified eye: Secondary | ICD-10-CM | POA: Diagnosis not present

## 2013-09-13 DIAGNOSIS — H2510 Age-related nuclear cataract, unspecified eye: Secondary | ICD-10-CM | POA: Diagnosis not present

## 2013-09-13 DIAGNOSIS — H2589 Other age-related cataract: Secondary | ICD-10-CM | POA: Diagnosis not present

## 2013-09-15 ENCOUNTER — Telehealth: Payer: Self-pay

## 2013-09-15 MED ORDER — RIVAROXABAN 20 MG PO TABS: 20.0000 mg | ORAL_TABLET | Freq: Every day | ORAL | Status: DC

## 2013-09-15 MED ORDER — AMLODIPINE BESYLATE 2.5 MG PO TABS: ORAL_TABLET | ORAL | Status: DC

## 2013-09-15 NOTE — Telephone Encounter (Signed)
Refilled rx's

## 2013-10-02 ENCOUNTER — Other Ambulatory Visit: Payer: Self-pay | Admitting: Internal Medicine

## 2013-10-02 DIAGNOSIS — Z853 Personal history of malignant neoplasm of breast: Secondary | ICD-10-CM

## 2013-10-16 ENCOUNTER — Ambulatory Visit
Admission: RE | Admit: 2013-10-16 | Discharge: 2013-10-16 | Disposition: A | Discharge status: Home / Self Care | Payer: Medicare Other | Source: Ambulatory Visit | Attending: Internal Medicine | Admitting: Internal Medicine

## 2013-10-16 DIAGNOSIS — R928 Other abnormal and inconclusive findings on diagnostic imaging of breast: Secondary | ICD-10-CM | POA: Diagnosis not present

## 2013-10-16 DIAGNOSIS — Z853 Personal history of malignant neoplasm of breast: Secondary | ICD-10-CM

## 2013-11-06 ENCOUNTER — Other Ambulatory Visit: Payer: Medicare Other

## 2013-11-15 ENCOUNTER — Other Ambulatory Visit: Payer: Self-pay | Admitting: General Surgery

## 2013-11-15 MED ORDER — AMLODIPINE BESYLATE 2.5 MG PO TABS
ORAL_TABLET | ORAL | Status: DC
Start: 2013-11-15 — End: 2014-01-18

## 2013-11-15 NOTE — Telephone Encounter (Signed)
rx sent in for pt.  

## 2013-11-20 ENCOUNTER — Other Ambulatory Visit: Payer: Self-pay | Admitting: General Surgery

## 2013-11-20 ENCOUNTER — Encounter: Payer: Self-pay | Admitting: General Surgery

## 2013-11-22 ENCOUNTER — Encounter: Payer: Self-pay | Admitting: Cardiology

## 2013-11-22 ENCOUNTER — Encounter: Payer: Self-pay | Admitting: General Surgery

## 2013-11-22 ENCOUNTER — Other Ambulatory Visit (INDEPENDENT_AMBULATORY_CARE_PROVIDER_SITE_OTHER): Payer: Medicare Other

## 2013-11-22 ENCOUNTER — Ambulatory Visit (INDEPENDENT_AMBULATORY_CARE_PROVIDER_SITE_OTHER): Payer: Medicare Other | Admitting: Cardiology

## 2013-11-22 VITALS — BP 110/70 | HR 60 | Ht 66.0 in | Wt 146.8 lb

## 2013-11-22 DIAGNOSIS — R9431 Abnormal electrocardiogram [ECG] [EKG]: Secondary | ICD-10-CM

## 2013-11-22 DIAGNOSIS — I4891 Unspecified atrial fibrillation: Secondary | ICD-10-CM | POA: Diagnosis not present

## 2013-11-22 DIAGNOSIS — I10 Essential (primary) hypertension: Secondary | ICD-10-CM | POA: Diagnosis not present

## 2013-11-22 DIAGNOSIS — Z79899 Other long term (current) drug therapy: Secondary | ICD-10-CM

## 2013-11-22 DIAGNOSIS — I480 Paroxysmal atrial fibrillation: Secondary | ICD-10-CM

## 2013-11-22 DIAGNOSIS — I4819 Other persistent atrial fibrillation: Secondary | ICD-10-CM | POA: Insufficient documentation

## 2013-11-22 LAB — CBC
HEMATOCRIT: 39.3 % (ref 36.0–46.0)
HEMOGLOBIN: 13.2 g/dL (ref 12.0–15.0)
MCHC: 33.5 g/dL (ref 30.0–36.0)
MCV: 91.3 fl (ref 78.0–100.0)
PLATELETS: 217 10*3/uL (ref 150.0–400.0)
RBC: 4.31 Mil/uL (ref 3.87–5.11)
RDW: 13.2 % (ref 11.5–14.6)
WBC: 4.9 10*3/uL (ref 4.5–10.5)

## 2013-11-22 NOTE — Patient Instructions (Signed)
Your physician recommends that you continue on your current medications as directed. Please refer to the Current Medication list given to you today.  Your physician has requested that you have an exercise stress myoview. For further information please visit [email or web address]. Please follow instruction sheet, as given.  [facility] is sending all your lab work over from their office for [place].   Your physician wants you to follow-up in: 6 months with Dr Mallie Snooks will receive a reminder letter in the mail two months in advance. If you don't receive a letter, please call our office to schedule the follow-up appointment.

## 2013-11-22 NOTE — Progress Notes (Signed)
[facility], [address] [place], [place]  [address] Phone: [phone] Fax:  [phone]  Date:  11/22/2013   ID:  Jamie Coffey, DOB 1941/03/18, MRN [id number]  PCP:  [name], MD  Cardiologist:  Fransico Him, MD     History of Present Illness: Jamie Coffey is a 73 y.o. female with a history of PAF, HTN, MVP and diastolic dysfunction who presents today for followup.  She is doing well.  She denies any chest pain, SOB, DOE, LE edema, dizziness, palpitations or syncope.   Wt Readings from Last 3 Encounters:  11/22/13 146 lb 12.8 oz (66.588 kg)  11/30/12 147 lb (66.679 kg)     Past Medical History  Diagnosis Date  . Atrial fibrillation with RVR   . Factor 5 Leiden mutation, heterozygous   . Chest pain, unspecified   . PAF (paroxysmal atrial fibrillation)   . Mitral valve prolapse   . Breast cancer     s/p XRT  . History of echocardiogram 10/12    mild AI, mod TR, trivial PR/MR, grade II diastolic dysfunction  . Dysphagia   . Heart burn   . DVT (deep venous thrombosis)   . Depression   . Arthritis   . Hypertension     Current Outpatient Prescriptions  Medication Sig Dispense Refill  . amLODipine (NORVASC) 2.5 MG tablet 1 TABLET ONCE A DAY ORALLY 30 DAY(S)  30 tablet  5  . celecoxib (CELEBREX) 200 MG capsule Take 200 mg by mouth See admin instructions. 200mg  in the morning Monday through Friday. Does not take on the weekend      . desvenlafaxine (PRISTIQ) 50 MG 24 hr tablet Take 50 mg by mouth daily.      Marland Kitchen levothyroxine (SYNTHROID, LEVOTHROID) 75 MCG tablet Take 75 mcg by mouth daily before breakfast.      . metoprolol tartrate (LOPRESSOR) 25 MG tablet Take 1/2 to 1 tablet as needed for palpitations lasting more than 30 minutes  15 tablet  5  . Rivaroxaban (XARELTO) 20 MG TABS tablet Take 1 tablet (20 mg total) by mouth daily with supper.  90 tablet  0   No current facility-administered medications for this visit.    Allergies:   No Known  Allergies  Social History:  The patient  reports that she has never smoked. She does not have any smokeless tobacco history on file. She reports that she drinks alcohol. She reports that she does not use illicit drugs.   Family History:  The patient's family history is not on file.   ROS:  Please see the history of present illness.      All other systems reviewed and negative.   PHYSICAL EXAM: VS:  BP 110/70  Pulse 60  Ht 5\' 6"  (1.676 m)  Wt 146 lb 12.8 oz (66.588 kg)  BMI 23.71 kg/m2 Well nourished, well developed, in no acute distress HEENT: normal Neck: no JVD Cardiac:  normal S1, S2; RRR; no murmur Lungs:  clear to auscultation bilaterally, no wheezing, rhonchi or rales Abd: soft, nontender, no hepatomegaly Ext: no edema Skin: warm and dry Neuro:  CNs 2-12 intact, no focal abnormalities noted  EKG:  NSR with diffuse nonspecific ST/T wave abnormality  ASSESSMENT AND PLAN:  1. PAF maintaining NSR - continue Xarelto/metoprolol - I will see if she has had a NOAC panel done from  Her PCP 2. HTN well controlled - continue amlodipine/metoprolol 3.  Abnormal EKG with more prominent ST/T wave abnormality in  the inferolateral leads than prior EKG - Stress myoview to rule out ischemia  Followup with me in 6 months  Signed, Fransico Him, MD 11/22/2013 11:25 AM

## 2013-11-27 DIAGNOSIS — M1710 Unilateral primary osteoarthritis, unspecified knee: Secondary | ICD-10-CM | POA: Diagnosis not present

## 2013-11-27 DIAGNOSIS — IMO0002 Reserved for concepts with insufficient information to code with codable children: Secondary | ICD-10-CM | POA: Diagnosis not present

## 2013-11-27 DIAGNOSIS — M76899 Other specified enthesopathies of unspecified lower limb, excluding foot: Secondary | ICD-10-CM | POA: Diagnosis not present

## 2013-12-11 ENCOUNTER — Ambulatory Visit (HOSPITAL_COMMUNITY): Payer: Medicare Other | Attending: Cardiovascular Disease | Admitting: Radiology

## 2013-12-11 VITALS — BP 165/74 | Ht 66.0 in | Wt 145.0 lb

## 2013-12-11 DIAGNOSIS — R9431 Abnormal electrocardiogram [ECG] [EKG]: Secondary | ICD-10-CM

## 2013-12-11 DIAGNOSIS — I491 Atrial premature depolarization: Secondary | ICD-10-CM

## 2013-12-11 MED ORDER — TECHNETIUM TC 99M SESTAMIBI GENERIC - CARDIOLITE
11.0000 | Freq: Once | INTRAVENOUS | Status: AC | PRN
  Administered 2013-12-11: 11 via INTRAVENOUS

## 2013-12-11 MED ORDER — TECHNETIUM TC 99M SESTAMIBI GENERIC - CARDIOLITE
33.0000 | Freq: Once | INTRAVENOUS | Status: AC | PRN
  Administered 2013-12-11: 33 via INTRAVENOUS

## 2013-12-11 NOTE — Progress Notes (Signed)
[facility] [address] [place], [place] [address] [phone]    Cardiology Nuclear Med Study  Jamie Coffey is a 73 y.o. female     MRN : [id number]     DOB: July 16, 1941  Procedure Date: 12/11/2013  Nuclear Med Background Indication for Stress Test:  Evaluation for Ischemia and Abnormal EKG:ST/T wave abnormality History:  No Hx of CAD PAF, MVP, 2012 ECHO: EF: 71%, '14 MPI: EF: 75%  Cardiac Risk Factors: Family History - CAD and Hypertension  Symptoms:  DOE, Fatigue and Palpitations   Nuclear Pre-Procedure Caffeine/Decaff Intake:  None NPO After: 8:00pm   Lungs:  clear O2 Sat: 97% on room air. IV 0.9% NS with Angio Cath:  22g  IV Site: R Hand  IV Started by:  Matilde Haymaker, RN  Chest Size (in):  34 Cup Size: B  Height: 5\' 6"  (1.676 m)  Weight:  145 lb (65.772 kg)  BMI:  Body mass index is 23.41 kg/(m^2). Tech Comments:  No Lopressor x 36 hrs    Nuclear Med Study 1 or 2 day study: 1 day  Stress Test Type:  Stress  Reading MD: n/a  Order Authorizing Provider:  Tressia Miners Turner,MD  Resting Radionuclide: Technetium 53m Sestamibi  Resting Radionuclide Dose: 10.8 mCi   Stress Radionuclide:  Technetium 81m Sestamibi  Stress Radionuclide Dose: 33.0 mCi           Stress Protocol Rest HR: 57 Stress HR: 134  Rest BP: 165/74 Stress BP: 196/73  Exercise Time (min): 7:15 METS: 8.90   Predicted Max HR: 149 bpm % Max HR: 89.93 bpm Rate Pressure Product: 51761   Dose of Adenosine (mg):  n/a Dose of Lexiscan: n/a mg  Dose of Atropine (mg): n/a Dose of Dobutamine: n/a mcg/kg/min (at max HR)  Stress Test Technologist: Perrin Maltese, EMT-P  Nuclear Technologist:  Charlton Amor, CNMT     Rest Procedure:  Myocardial perfusion imaging was performed at rest 45 minutes following the intravenous administration of Technetium 52m Sestamibi. Rest ECG: NSR with non-specific ST-T wave changes  Stress Procedure:  The patient exercised on the treadmill  utilizing the Bruce Protocol for 7:15 minutes. The patient stopped due to fatigue and denied any chest pain.  Technetium 55m Sestamibi was injected at peak exercise and myocardial perfusion imaging was performed after a brief delay. Stress ECG: No significant ST segment change suggestive of ischemia.  QPS Raw Data Images:  Normal; no motion artifact; normal heart/lung ratio. Stress Images:  Normal homogeneous uptake in all areas of the myocardium. Rest Images:  Normal homogeneous uptake in all areas of the myocardium. Subtraction (SDS):  No evidence of ischemia. Transient Ischemic Dilatation (Normal <1.22):  0.96 Lung/Heart Ratio (Normal <0.45):  0.23  Quantitative Gated Spect Images QGS EDV:  96 ml QGS ESV:  34 ml  Impression Exercise Capacity:  Good exercise capacity. BP Response:  Normal blood pressure response. Clinical Symptoms:  No chest pain. ECG Impression:  No significant ST segment change suggestive of ischemia. Comparison with Prior Nuclear Study: No images to compare  Overall Impression:  Normal stress nuclear study. LV Ejection Fraction: 65%.  LV Wall Motion:  NL LV Function; NL Wall Motion  Darlin Coco MD

## 2014-01-18 ENCOUNTER — Encounter (HOSPITAL_COMMUNITY): Payer: Self-pay | Admitting: Emergency Medicine

## 2014-01-18 ENCOUNTER — Emergency Department (HOSPITAL_COMMUNITY): Payer: Medicare Other

## 2014-01-18 ENCOUNTER — Observation Stay (HOSPITAL_COMMUNITY)
Admission: EM | Admit: 2014-01-18 | Discharge: 2014-01-19 | Disposition: A | Discharge status: Home / Self Care | Payer: Medicare Other | Attending: Cardiology | Admitting: Cardiology

## 2014-01-18 DIAGNOSIS — D6851 Activated protein C resistance: Secondary | ICD-10-CM | POA: Diagnosis present

## 2014-01-18 DIAGNOSIS — Z853 Personal history of malignant neoplasm of breast: Secondary | ICD-10-CM | POA: Diagnosis not present

## 2014-01-18 DIAGNOSIS — Z79899 Other long term (current) drug therapy: Secondary | ICD-10-CM | POA: Diagnosis not present

## 2014-01-18 DIAGNOSIS — R0789 Other chest pain: Secondary | ICD-10-CM | POA: Diagnosis not present

## 2014-01-18 DIAGNOSIS — I4819 Other persistent atrial fibrillation: Secondary | ICD-10-CM | POA: Diagnosis present

## 2014-01-18 DIAGNOSIS — Z86711 Personal history of pulmonary embolism: Secondary | ICD-10-CM | POA: Diagnosis present

## 2014-01-18 DIAGNOSIS — F3289 Other specified depressive episodes: Secondary | ICD-10-CM | POA: Diagnosis not present

## 2014-01-18 DIAGNOSIS — I209 Angina pectoris, unspecified: Secondary | ICD-10-CM | POA: Diagnosis not present

## 2014-01-18 DIAGNOSIS — I200 Unstable angina: Secondary | ICD-10-CM | POA: Diagnosis not present

## 2014-01-18 DIAGNOSIS — Z86718 Personal history of other venous thrombosis and embolism: Secondary | ICD-10-CM | POA: Diagnosis not present

## 2014-01-18 DIAGNOSIS — R9431 Abnormal electrocardiogram [ECG] [EKG]: Secondary | ICD-10-CM | POA: Diagnosis present

## 2014-01-18 DIAGNOSIS — F329 Major depressive disorder, single episode, unspecified: Secondary | ICD-10-CM | POA: Diagnosis not present

## 2014-01-18 DIAGNOSIS — I10 Essential (primary) hypertension: Secondary | ICD-10-CM | POA: Diagnosis not present

## 2014-01-18 DIAGNOSIS — R0602 Shortness of breath: Secondary | ICD-10-CM | POA: Diagnosis not present

## 2014-01-18 DIAGNOSIS — I4891 Unspecified atrial fibrillation: Secondary | ICD-10-CM

## 2014-01-18 DIAGNOSIS — I2510 Atherosclerotic heart disease of native coronary artery without angina pectoris: Secondary | ICD-10-CM | POA: Diagnosis not present

## 2014-01-18 DIAGNOSIS — D6859 Other primary thrombophilia: Secondary | ICD-10-CM | POA: Diagnosis not present

## 2014-01-18 DIAGNOSIS — R079 Chest pain, unspecified: Secondary | ICD-10-CM

## 2014-01-18 DIAGNOSIS — K219 Gastro-esophageal reflux disease without esophagitis: Secondary | ICD-10-CM | POA: Insufficient documentation

## 2014-01-18 DIAGNOSIS — M129 Arthropathy, unspecified: Secondary | ICD-10-CM | POA: Insufficient documentation

## 2014-01-18 DIAGNOSIS — I480 Paroxysmal atrial fibrillation: Secondary | ICD-10-CM

## 2014-01-18 DIAGNOSIS — Z7901 Long term (current) use of anticoagulants: Secondary | ICD-10-CM | POA: Diagnosis not present

## 2014-01-18 HISTORY — DX: Gastro-esophageal reflux disease without esophagitis: K21.9

## 2014-01-18 HISTORY — DX: Personal history of other diseases of the digestive system: Z87.19

## 2014-01-18 HISTORY — DX: Personal history of other venous thrombosis and embolism: Z86.718

## 2014-01-18 HISTORY — DX: Hypothyroidism, unspecified: E03.9

## 2014-01-18 HISTORY — DX: Sleep deprivation: Z72.820

## 2014-01-18 LAB — CBC
HCT: 39.8 % (ref 36.0–46.0)
HEMOGLOBIN: 13.3 g/dL (ref 12.0–15.0)
MCH: 30.2 pg (ref 26.0–34.0)
MCHC: 33.4 g/dL (ref 30.0–36.0)
MCV: 90.2 fL (ref 78.0–100.0)
PLATELETS: 222 10*3/uL (ref 150–400)
RBC: 4.41 MIL/uL (ref 3.87–5.11)
RDW: 13.1 % (ref 11.5–15.5)
WBC: 5 10*3/uL (ref 4.0–10.5)

## 2014-01-18 LAB — BASIC METABOLIC PANEL
BUN: 21 mg/dL (ref 6–23)
CALCIUM: 9.4 mg/dL (ref 8.4–10.5)
CO2: 25 mEq/L (ref 19–32)
Chloride: 99 mEq/L (ref 96–112)
Creatinine, Ser: 0.78 mg/dL (ref 0.50–1.10)
GFR, EST NON AFRICAN AMERICAN: 82 mL/min — AB (ref >90)
GLUCOSE: 86 mg/dL (ref 70–99)
Potassium: 3.8 mEq/L (ref 3.7–5.3)
SODIUM: 142 meq/L (ref 137–147)

## 2014-01-18 LAB — D-DIMER, QUANTITATIVE: D-Dimer, Quant: <0.27 ug/mL-FEU (ref 0.00–0.48)

## 2014-01-18 LAB — CBG MONITORING, ED: GLUCOSE-CAPILLARY: 75 mg/dL (ref 70–99)

## 2014-01-18 LAB — I-STAT TROPONIN, ED: Troponin i, poc: 0 ng/mL (ref 0.00–0.08)

## 2014-01-18 LAB — TROPONIN I: Troponin I: <0.3 ng/mL (ref <0.30)

## 2014-01-18 MED ORDER — ASPIRIN EC 81 MG PO TBEC
81.0000 mg | DELAYED_RELEASE_TABLET | Freq: Every day | ORAL | Status: DC
  Administered 2014-01-19: 81 mg via ORAL
  Filled 2014-01-18 (×2): qty 1

## 2014-01-18 MED ORDER — SODIUM CHLORIDE 0.9 % IJ SOLN
3.0000 mL | Freq: Two times a day (BID) | INTRAMUSCULAR | Status: DC
  Administered 2014-01-18: 3 mL via INTRAVENOUS

## 2014-01-18 MED ORDER — HEPARIN BOLUS VIA INFUSION
4000.0000 [IU] | Freq: Once | INTRAVENOUS | Status: AC
  Administered 2014-01-18: 4000 [IU] via INTRAVENOUS
  Filled 2014-01-18: qty 4000

## 2014-01-18 MED ORDER — SODIUM CHLORIDE 0.9 % IJ SOLN: 3.0000 mL | INTRAMUSCULAR | Status: DC | PRN

## 2014-01-18 MED ORDER — LEVOTHYROXINE SODIUM 75 MCG PO TABS
75.0000 ug | ORAL_TABLET | Freq: Every day | ORAL | Status: DC
  Administered 2014-01-19: 75 ug via ORAL
  Filled 2014-01-18 (×2): qty 1

## 2014-01-18 MED ORDER — SODIUM CHLORIDE 0.9 % IV SOLN: 250.0000 mL | INTRAVENOUS | Status: DC | PRN

## 2014-01-18 MED ORDER — SODIUM CHLORIDE 0.9 % IV SOLN
250.0000 mL | INTRAVENOUS | Status: DC | PRN
Start: 2014-01-18 — End: 2014-01-19

## 2014-01-18 MED ORDER — SODIUM CHLORIDE 0.9 % IV SOLN: INTRAVENOUS | Status: DC

## 2014-01-18 MED ORDER — ASPIRIN 81 MG PO CHEW
81.0000 mg | CHEWABLE_TABLET | ORAL | Status: AC
  Administered 2014-01-19: 81 mg via ORAL
  Filled 2014-01-18: qty 1

## 2014-01-18 MED ORDER — AMLODIPINE BESYLATE 2.5 MG PO TABS
2.5000 mg | ORAL_TABLET | Freq: Every day | ORAL | Status: DC
  Administered 2014-01-18 – 2014-01-19 (×2): 2.5 mg via ORAL
  Filled 2014-01-18 (×2): qty 1

## 2014-01-18 MED ORDER — METOPROLOL TARTRATE 12.5 MG HALF TABLET
12.5000 mg | ORAL_TABLET | Freq: Two times a day (BID) | ORAL | Status: DC
  Administered 2014-01-18 – 2014-01-19 (×2): 12.5 mg via ORAL
  Filled 2014-01-18 (×3): qty 1

## 2014-01-18 MED ORDER — NITROGLYCERIN 0.4 MG SL SUBL: 0.4000 mg | SUBLINGUAL_TABLET | SUBLINGUAL | Status: DC | PRN

## 2014-01-18 MED ORDER — VENLAFAXINE HCL ER 75 MG PO CP24
75.0000 mg | ORAL_CAPSULE | Freq: Every evening | ORAL | Status: DC
  Administered 2014-01-18: 75 mg via ORAL
  Filled 2014-01-18 (×2): qty 1

## 2014-01-18 MED ORDER — ATORVASTATIN CALCIUM 40 MG PO TABS
40.0000 mg | ORAL_TABLET | Freq: Every day | ORAL | Status: DC
  Administered 2014-01-18: 40 mg via ORAL
  Filled 2014-01-18 (×2): qty 1

## 2014-01-18 MED ORDER — HEPARIN (PORCINE) IN NACL 100-0.45 UNIT/ML-% IJ SOLN
700.0000 [IU]/h | INTRAMUSCULAR | Status: DC
  Administered 2014-01-18: 800 [IU]/h via INTRAVENOUS
  Filled 2014-01-18: qty 250

## 2014-01-18 MED ORDER — SODIUM CHLORIDE 0.9 % IJ SOLN: 3.0000 mL | Freq: Two times a day (BID) | INTRAMUSCULAR | Status: DC

## 2014-01-18 NOTE — Progress Notes (Signed)
ANTICOAGULATION CONSULT NOTE - Initial Consult  Pharmacy Consult for Heparin Indication: chest pain/ACS  No Known Allergies  Patient Measurements: Weight 65.8 kg on 12/11/13 Height 168 cm on 12/11/13 IBW 60 kg Heparin Dosing Weight: 65.8 kg  Vital Signs: Temp: 97.8 F (36.6 C) (06/11 1019) Temp src: Oral (06/11 1019) BP: 141/82 mmHg (06/11 1410) Pulse Rate: 65 (06/11 1410)  Labs:  Recent Labs  01/18/14 1019 01/18/14 1057  HGB 13.3  --   HCT 39.8  --   PLT 222  --   CREATININE 0.78  --   TROPONINI  --  <0.30    The CrCl is unknown because both a height and weight (above a minimum accepted value) are required for this calculation.   Medical History: Past Medical History  Diagnosis Date  . PAF (paroxysmal atrial fibrillation)     a. 05/2011 Echo: EF 70%, triv MR, mild AI, mod TR, Gr 1 DD.  . Factor 5 Leiden mutation, heterozygous     a. chronic xarelto.  . Chest pain, unspecified     a. 12/2013 MV: EF 65%, no ischemia/infarct.  . Mitral valve prolapse   . Breast cancer     a. s/p XRT  . Dysphagia   . GERD (gastroesophageal reflux disease)   . History of DVT & PE   . Depression   . Arthritis   . Hypertension     Medications:  Xarelto 20mg  daily PTA - last dose 6/10 PM, 10 PM.   Assessment: 89 YOF with history of PAF and factor V leiden on chronic anticoagulation who presented to ED with chest pain to start IV heparin. History of therapeutic heparin levels on 950 units/hr in 2014. SCr 0.78. CBC ok.   Goal of Therapy:  Heparin level 0.3-0.7 units/ml Monitor platelets by anticoagulation protocol: Yes   Plan:  1. At 2200 (24 hours since last Xarelto) will start IV heparin at 4000 units x1 then heparin at 800 units/hr.  2. Heparin level in 6 hours. 3. Daily heparin level and CBC.   Sloan Leiter, PharmD, BCPS Clinical Pharmacist [phone] 01/18/2014,5:54 PM

## 2014-01-18 NOTE — ED Provider Notes (Signed)
CSN: [id number]     Arrival date & time 01/18/14  1011 History   First MD Initiated Contact with Patient 01/18/14 1015     Chief Complaint  Patient presents with  . Chest Pain     (Consider location/radiation/quality/duration/timing/severity/associated sxs/prior Treatment) HPI  This is a 73 year old female with a past medical history of factor V Leiden mutation, paroxysmal atrial fibrillation, previous history of breast cancer, previous history of DVT and pulmonary embolism he presents to the emergency department with chief complaint of sudden onset chest pressure and shortness of breath. Patient was walking with her friends today which she normally does for exercise. He had stopped to speak with her friend when she noticed sudden heavy pressure on her chest. She states she was unable to speak and breathe simultaneously. She noticed that her heart was racing. She denied any chest pain, jaw pain, left shoulder pain, paresthesia or radiating pain. Patient states that her chest pressure lasted approximately 10 minutes. She is able to walk back to her home. She had some relief with rest. She did call a friend at that time until her that she felt like she "had a 400 pound person sitting on her chest." Her friend states that she did appear to be cold and sweaty and was very pale. She arrived by EMS. She was also given him 24 mg of by mouth aspirin. She has no symptoms at this time. Patient is taking all her medications as directed. She is on Xarerlto for chronic anticoagulation. Risks factors for acute coronary syndrome include a long-standing history of hypertension, mother with first MI at age 5. She denies a history of high cholesterol or smoking or diabetes. Patient denies a history of asthma. Denies fevers, chills, myalgias, arthralgias. Denies dysuria, flank pain, suprapubic pain, frequency, urgency, or hematuria. Denies headaches, light headedness, weakness, visual disturbances. Denies abdominal  pain, nausea, vomiting, diarrhea or constipation.   Past Medical History  Diagnosis Date  . Atrial fibrillation with RVR   . Factor 5 Leiden mutation, heterozygous   . Chest pain, unspecified   . PAF (paroxysmal atrial fibrillation)   . Mitral valve prolapse   . Breast cancer     s/p XRT  . History of echocardiogram 10/12    mild AI, mod TR, trivial PR/MR, grade II diastolic dysfunction  . Dysphagia   . Heart burn   . DVT (deep venous thrombosis)   . Depression   . Arthritis   . Hypertension    Past Surgical History  Procedure Laterality Date  . Breast surgery    . Abdominal hysterectomy     No family history on file. History  Substance Use Topics  . Smoking status: Never Smoker   . Smokeless tobacco: Not on file  . Alcohol Use: Yes     Comment: social drinker   OB History   Grav Para Term Preterm Abortions TAB SAB Ect Mult Living                 Review of Systems  Ten systems reviewed and are negative for acute change, except as noted in the HPI.    Allergies  Review of patient's allergies indicates no known allergies.  Home Medications   Prior to Admission medications   Medication Sig Start Date End Date Taking? Authorizing Provider  amLODipine (NORVASC) 2.5 MG tablet Take 2.5 mg by mouth daily.   Yes Historical Provider, MD  metoprolol tartrate (LOPRESSOR) 25 MG tablet Take 12.5-25 mg by mouth See admin  instructions. Take 12.5-25 mg by mouth for palpitations lasting longer than 30 minutes   Yes Historical Provider, MD  celecoxib (CELEBREX) 200 MG capsule Take 200 mg by mouth See admin instructions. 200mg  in the morning Monday through Friday. Does not take on the weekend    Historical Provider, MD  desvenlafaxine (PRISTIQ) 50 MG 24 hr tablet Take 50 mg by mouth daily.    Historical Provider, MD  levothyroxine (SYNTHROID, LEVOTHROID) 75 MCG tablet Take 75 mcg by mouth daily before breakfast.    Historical Provider, MD  Rivaroxaban (XARELTO) 20 MG TABS tablet  Take 1 tablet (20 mg total) by mouth daily with supper. 09/15/13   Sueanne Margarita, MD   BP 148/48  Pulse 66  Temp(Src) 97.8 F (36.6 C) (Oral)  Resp 16  SpO2 100% Physical Exam Physical Exam  Nursing note and vitals reviewed. Constitutional: She is oriented to person, place, and time. She appears well-developed and well-nourished. No distress.  HENT:  Head: Normocephalic and atraumatic.  Eyes: Conjunctivae normal and EOM are normal. Pupils are equal, round, and reactive to light. No scleral icterus.  Neck: Normal range of motion.  Cardiovascular: Normal rate, regular rhythm and normal heart sounds.  Exam reveals no gallop and no friction rub.   No murmur heard. Pulmonary/Chest: Effort normal and breath sounds normal. No respiratory distress.  Abdominal: Soft. Bowel sounds are normal. She exhibits no distension and no mass. There is no tenderness. There is no guarding.  Neurological: She is alert and oriented to person, place, and time.  Skin: Skin is warm and dry. She is not diaphoretic.    ED Course  Procedures (including critical care time) Labs Review Labs Reviewed  CBC  BASIC METABOLIC PANEL  TROPONIN I  CBG MONITORING, ED    Imaging Review No results found.   EKG Interpretation None       Date: 01/18/2014  Rate: 67  Rhythm: normal sinus rhythm  QRS Axis: normal  Intervals: normal  ST/T Wave abnormalities: ST depressions laterally  Conduction Disutrbances:none  Narrative Interpretation: Sinus rhythm. Minimal ST depressions laterally and lead 2, no reciprocal changes unchanged from EKG 11/2013  Old EKG Reviewed: unchanged    MDM   Final diagnoses:  PAF (paroxysmal atrial fibrillation)  Abnormal EKG  Factor 5 Leiden mutation, heterozygous  Hypertension  Acute chest pain    Patient with cp complaint.   Review of her chart shows normal perfusion during stress test one year ago. She is followed by Dr. Golden Hurter. PCP Dr. Bevelyn Buckles. Symptoms are  resolved at this time. EKG unchanged from previous and I doubt ischemia. Workup pending at this time. Doubt pulmonary embolus, dissecting thoracic aneurysm, doubt pulmonary etiology. Chart also shows a history of previous chest pain complaints.  3:29 PM BP 141/82  Pulse 65  Temp(Src) 97.8 F (36.6 C) (Oral)  Resp 18  SpO2 97% Patient with 2nd negative troponin.  I have spoken with PA Barret of [facility]. Cardiology will consult on the patient.   Margarita Mail, PA-C 01/18/14 1830

## 2014-01-18 NOTE — ED Notes (Signed)
PER EMS: pt was out for her daily walk this morning when she started to get SOB, unable to talk, and developed central non-radiating chest pressure. Pt was able to walk home, sat down and then started to feel better but became diaphoretic and nauseous. Pt states she had not had breakfast today but she usually eats before her walk. Pt also reports for the past 3 days she had trouble concentrating and sleep trouble. Hx of afib and PE. BP- 164/84, HR-66 NSR, 99% RA, CBG-102. Pt given 324 asa. Pt now currently CP free. A&OX4. Takes Xarelto.

## 2014-01-18 NOTE — ED Provider Notes (Signed)
4:00 PM = Received sign-out from [facility]. Await cardiology recommendations.    Results for orders placed during the hospital encounter of 01/18/14  CBC      Result Value Ref Range   WBC 5.0  4.0 - 10.5 K/uL   RBC 4.41  3.87 - 5.11 MIL/uL   Hemoglobin 13.3  12.0 - 15.0 g/dL   HCT 39.8  36.0 - 46.0 %   MCV 90.2  78.0 - 100.0 fL   MCH 30.2  26.0 - 34.0 pg   MCHC 33.4  30.0 - 36.0 g/dL   RDW 13.1  11.5 - 15.5 %   Platelets 222  150 - 400 K/uL  BASIC METABOLIC PANEL      Result Value Ref Range   Sodium 142  137 - 147 mEq/L   Potassium 3.8  3.7 - 5.3 mEq/L   Chloride 99  96 - 112 mEq/L   CO2 25  19 - 32 mEq/L   Glucose, Bld 86  70 - 99 mg/dL   BUN 21  6 - 23 mg/dL   Creatinine, Ser 0.78  0.50 - 1.10 mg/dL   Calcium 9.4  8.4 - 10.5 mg/dL   GFR calc non Af Amer 82 (*) >90 mL/min   GFR calc Af Amer >90  >90 mL/min  TROPONIN I      Result Value Ref Range   Troponin I <0.30  <0.30 ng/mL  D-DIMER, QUANTITATIVE      Result Value Ref Range   D-Dimer, Quant <0.27  0.00 - 0.48 ug/mL-FEU  TROPONIN I      Result Value Ref Range   Troponin I <0.30  <0.30 ng/mL  CBG MONITORING, ED      Result Value Ref Range   Glucose-Capillary 75  70 - 99 mg/dL  I-STAT TROPOININ, ED      Result Value Ref Range   Troponin i, poc 0.00  0.00 - 0.08 ng/mL   Comment 3             DG Chest 2 View (Final result)  Result time: 01/18/14 11:36:56    Final result by Rad Results In Interface (01/18/14 11:36:56)    Narrative:   CLINICAL DATA: Chest pain  EXAM: CHEST 2 VIEW  COMPARISON: 11/28/2012  FINDINGS: The heart size and mediastinal contours are within normal limits. Both lungs are clear. The visualized skeletal structures are unremarkable.  IMPRESSION: No active cardiopulmonary disease.   Electronically Signed By: Franchot Gallo M.D. On: 01/18/2014 11:36     Patient admitted to cardiology for further evaluation and management.    Final impressions: 1. PAF (paroxysmal  atrial fibrillation)   2. Abnormal EKG   3. Factor 5 Leiden mutation, heterozygous   4. Hypertension   5. Acute chest pain       Harold Hedge Jarratt, [place] 01/19/14 [phone]

## 2014-01-18 NOTE — ED Notes (Addendum)
Husband cell: [phone]

## 2014-01-18 NOTE — ED Notes (Signed)
Pt reports she has been under a tremendous amount of stress recently.

## 2014-01-18 NOTE — H&P (Signed)
Patient ID: Jamie Coffey MRN: [id number], DOB/AGE: [birth year]   Admit date: 01/18/2014  Primary Physician: [name], MD Primary Cardiologist: T. Radford Pax, MD   Pt. Profile:  73 y/o female with h/o PAF and Factor V Leiden on chronic anticoagulation who presented to the ED today with chest pain.  Problem List  Past Medical History  Diagnosis Date  . PAF (paroxysmal atrial fibrillation)     a. 05/2011 Echo: EF 70%, triv MR, mild AI, mod TR, Gr 1 DD.  . Factor 5 Leiden mutation, heterozygous     a. chronic xarelto.  . Chest pain, unspecified     a. 12/2013 MV: EF 65%, no ischemia/infarct.  . Mitral valve prolapse   . Breast cancer     a. s/p XRT  . Dysphagia   . GERD (gastroesophageal reflux disease)   . History of DVT & PE   . Depression   . Arthritis   . Hypertension     Past Surgical History  Procedure Laterality Date  . Breast surgery    . Abdominal hysterectomy       Allergies  No Known Allergies  HPI  73 year old female with the above complex problem list. She has both a history paroxysmal atrial fibrillation and factor V Leiden on chronic anticoagulation with Xarelto. She was last seen in clinic in April of this year, at which time she was noted to have more prominent inferolateral ST and T-wave changes without symptoms. She was set up for an exercise Myoview which was performed in early May, and showed normal LV function without evidence of ischemia or infarct. She is very active at home, walking several days per week. She typically walks a 15 minute mile come off accomplish 2+ miles. She usually does not experience any limitations when walking.  Yesterday, she was approximately 2 miles was then about half an hour and felt very hot, sweaty, and fatigue. She denies any chest pain or dyspnea. Symptoms improved rest and application of a cold rag. She had no further symptoms the remainder of the day. This morning, she was doing her usual walk with a friend  and the end of the walk developed severe dyspnea and chest pressure. She's never had chest pressure before. She and her friend walked about a half block back to her home where symptoms started to ease off. EMS was called. During symptoms, the patient did feel some palpitations but did not think that she was in atrial fibrillation. She was taken to the ED by EMS and given aspirin on the way. Following arrival to the ED, symptoms completely resolved within about 40 minutes of onset. Here, ECG continues to show mild inferolateral ST and T changes which are somewhat less pronounced than on previous ECG in April. Patient is currently pain-free and troponin is normal.  Home Medications  Prior to Admission medications   Medication Sig Start Date End Date Taking? Authorizing Provider  amLODipine (NORVASC) 2.5 MG tablet Take 2.5 mg by mouth daily.   Yes Historical Provider, MD  celecoxib (CELEBREX) 200 MG capsule Take 200 mg by mouth daily. 200mg  in the morning Monday through Friday. Does not take on the weekend   Yes Historical Provider, MD  desvenlafaxine (PRISTIQ) 50 MG 24 hr tablet Take 50 mg by mouth daily.   Yes Historical Provider, MD  levothyroxine (SYNTHROID, LEVOTHROID) 75 MCG tablet Take 75 mcg by mouth daily before breakfast.   Yes Historical Provider, MD  metoprolol tartrate (LOPRESSOR) 25 MG tablet  Take 12.5-25 mg by mouth daily as needed (for palpitations). Take 12.5-25 mg by mouth for palpitations lasting longer than 30 minutes   Yes Historical Provider, MD  Rivaroxaban (XARELTO) 20 MG TABS tablet Take 1 tablet (20 mg total) by mouth daily with supper. 09/15/13  Yes Sueanne Margarita, MD    Family History  Family History  Problem Relation Age of Onset  . Heart attack Mother     "small heart attack" at 65, died @ 36.  . Other Father     died @ 35 following knee surgery  . Other Brother     alive and well.   Social History  History   Social History  . Marital Status: Married    Spouse  Name: N/A    Number of Children: N/A  . Years of Education: N/A   Occupational History  . Not on file.   Social History Main Topics  . Smoking status: Never Smoker   . Smokeless tobacco: Not on file  . Alcohol Use: Yes     Comment: social drinker  . Drug Use: No  . Sexual Activity: Not on file   Other Topics Concern  . Not on file   Social History Narrative   Lives in [place] with husband.  Retired.  Walks up to 4 days/wk - 15 min miles.     Review of Systems General:  No chills, fever, night sweats or weight changes.  Cardiovascular:  +++ chest pain, +++dyspnea on exertion, no edema, orthopnea, palpitations, paroxysmal nocturnal dyspnea. Dermatological: No rash, lesions/masses Respiratory: No cough, +++ dyspnea Urologic: No hematuria, dysuria Abdominal:   No nausea, vomiting, diarrhea, bright red blood per rectum, melena, or hematemesis Neurologic:  No visual changes, wkns, changes in mental status. All other systems reviewed and are otherwise negative except as noted above.  Physical Exam  Blood pressure 141/82, pulse 65, temperature 97.8 F (36.6 C), temperature source Oral, resp. rate 18, SpO2 97.00%.  General: Pleasant, NAD Psych: Normal affect. Neuro: Alert and oriented X 3. Moves all extremities spontaneously. HEENT: Normal  Neck: Supple without bruits or JVD. Lungs:  Resp regular and unlabored, CTA. Heart: RRR no s3, s4, mid-systolic click. Abdomen: Soft, non-tender, non-distended, BS + x 4.  Extremities: No clubbing, cyanosis or edema. DP/PT/Radials 2+ and equal bilaterally.  Labs  Troponin [facility] of Care Test)  Recent Labs  01/18/14 1456  TROPIPOC 0.00    Recent Labs  01/18/14 1057  TROPONINI <0.30   Lab Results  Component Value Date   WBC 5.0 01/18/2014   HGB 13.3 01/18/2014   HCT 39.8 01/18/2014   MCV 90.2 01/18/2014   PLT 222 01/18/2014     Recent Labs Lab 01/18/14 1019  NA 142  K 3.8  CL 99  CO2 25  BUN 21  CREATININE 0.78  CALCIUM  9.4  GLUCOSE 86   Radiology/Studies  Dg Chest 2 View  01/18/2014   CLINICAL DATA:  Chest pain  EXAM: CHEST  2 VIEW  COMPARISON:  11/28/2012  FINDINGS: The heart size and mediastinal contours are within normal limits. Both lungs are clear. The visualized skeletal structures are unremarkable.  IMPRESSION: No active cardiopulmonary disease.   Electronically Signed   By: Franchot Gallo M.D.   On: 01/18/2014 11:36   ECG  SB 59, min inflat st dep - slightly less pronounced than what was seen on ECG in April.  ASSESSMENT AND PLAN  1.  [place]:  Pt presents with a two day h/o  reduced exercise tolerance, fatigue yesterday, and exertional c/p and dyspnea today.  Ss concerning for angina.  Initial troponin is nl.  ECG w/ inferolateral ST/T changes, but these are slightly less pronounced than what was seen in April.  She had a negative Myoview in May, however was not having Ss at that time.  As she does have a h/o DVT/PE in the setting of Factor V Leiden, we will check a d-dimer, even though she is on xarelto, to r/o recurrent PE.  She is currently stable @ rest, not hypoxic, not tachycardic.  If d-dimer is abnl, we will check a CTA of the chest.  If d-dimer nl or CTA nl, will plan cath in AM.  As a result, will hold xarelto and start heparin tonight.  She normally takes her xarelto @ bedtime - last dose, last PM.  Cont ASA, bb.  Will add statin and check lipids/lft's.  2.  PAF:  She reports palpitations but did not think that she had recurrent afib.  All monitoring up to this point shows sinus rhythm.  Cont bb.  Holding xarelto in preparation for probable cath as above.  3.  Factor V Leiden:  Anticoagulation plans as above.  Signed, Jamie Hodgkins, Jamie Coffey 01/18/2014, 5:05 PM Patient seen and examined and history reviewed. Agree with above findings and plan. Very pleasant 73 yo WF with history of factor V leiden deficiency on Xarelto. History of DVT/PE following knee surgery in the past. History of paroxysmal  Afib. Over last 2 days has experienced decline in exercise tolerance associated with dyspnea, chest pain, and some jaw pain. Recent stress Myoview done due to abnormal Ecg and was normal. Clearly her symptoms are a significant change from her baseline. Recommend D dimer. If normal then PE very unlikely on Xarelto. If abnormal I would get a CT of her chest to rule out PE. If this is negative then I would recommend cardiac cath for definitive coronary work up. She has risk factors of HTN and early family history of CAD. Although recent myoview was normal there is concern this could be falsely negative. Will hold Xarelto tonight. Heparinize. Plan cardiac cath in am. The procedure and risks were reviewed including but not limited to death, myocardial infarction, stroke, arrythmias, bleeding, transfusion, emergency surgery, dye allergy, or renal dysfunction. The patient voices understanding and is agreeable to proceed. If stent is required will need to take into account that she is on chronic anticoagulation.   Jamie Coffey, [place] 01/18/2014 5:51 PM

## 2014-01-19 ENCOUNTER — Encounter (HOSPITAL_COMMUNITY)
Admission: EM | Disposition: A | Discharge status: Home / Self Care | Payer: Self-pay | Source: Home / Self Care | Attending: Emergency Medicine

## 2014-01-19 DIAGNOSIS — Z86711 Personal history of pulmonary embolism: Secondary | ICD-10-CM | POA: Diagnosis present

## 2014-01-19 DIAGNOSIS — R9431 Abnormal electrocardiogram [ECG] [EKG]: Secondary | ICD-10-CM | POA: Diagnosis not present

## 2014-01-19 DIAGNOSIS — D6859 Other primary thrombophilia: Secondary | ICD-10-CM

## 2014-01-19 DIAGNOSIS — R0789 Other chest pain: Secondary | ICD-10-CM

## 2014-01-19 DIAGNOSIS — Z7901 Long term (current) use of anticoagulants: Secondary | ICD-10-CM

## 2014-01-19 DIAGNOSIS — I4891 Unspecified atrial fibrillation: Secondary | ICD-10-CM | POA: Diagnosis not present

## 2014-01-19 DIAGNOSIS — I2510 Atherosclerotic heart disease of native coronary artery without angina pectoris: Secondary | ICD-10-CM | POA: Diagnosis not present

## 2014-01-19 HISTORY — PX: LEFT HEART CATHETERIZATION WITH CORONARY ANGIOGRAM: SHX5451

## 2014-01-19 LAB — TROPONIN I
Troponin I: <0.3 ng/mL (ref <0.30)
Troponin I: <0.3 ng/mL (ref <0.30)

## 2014-01-19 LAB — CBC
HEMATOCRIT: 38.7 % (ref 36.0–46.0)
Hemoglobin: 13 g/dL (ref 12.0–15.0)
MCH: 30.7 pg (ref 26.0–34.0)
MCHC: 33.6 g/dL (ref 30.0–36.0)
MCV: 91.3 fL (ref 78.0–100.0)
Platelets: 214 10*3/uL (ref 150–400)
RBC: 4.24 MIL/uL (ref 3.87–5.11)
RDW: 13.5 % (ref 11.5–15.5)
WBC: 4.6 10*3/uL (ref 4.0–10.5)

## 2014-01-19 LAB — HEPARIN LEVEL (UNFRACTIONATED)
HEPARIN UNFRACTIONATED: 0.71 [IU]/mL — AB (ref 0.30–0.70)
Heparin Unfractionated: 0.98 IU/mL — ABNORMAL HIGH (ref 0.30–0.70)

## 2014-01-19 LAB — LIPID PANEL
CHOL/HDL RATIO: 1.9 ratio
Cholesterol: 198 mg/dL (ref 0–200)
HDL: 105 mg/dL (ref >39)
LDL Cholesterol: 85 mg/dL (ref 0–99)
Triglycerides: 40 mg/dL (ref <150)
VLDL: 8 mg/dL (ref 0–40)

## 2014-01-19 LAB — PROTIME-INR
INR: 1.2 (ref 0.00–1.49)
Prothrombin Time: 14.9 seconds (ref 11.6–15.2)

## 2014-01-19 LAB — COMPREHENSIVE METABOLIC PANEL
ALK PHOS: 80 U/L (ref 39–117)
ALT: 11 U/L (ref 0–35)
AST: 16 U/L (ref 0–37)
Albumin: 3.6 g/dL (ref 3.5–5.2)
BUN: 17 mg/dL (ref 6–23)
CHLORIDE: 104 meq/L (ref 96–112)
CO2: 27 meq/L (ref 19–32)
Calcium: 9.2 mg/dL (ref 8.4–10.5)
Creatinine, Ser: 0.73 mg/dL (ref 0.50–1.10)
GFR calc non Af Amer: 84 mL/min — ABNORMAL LOW (ref >90)
GLUCOSE: 85 mg/dL (ref 70–99)
Potassium: 4.2 mEq/L (ref 3.7–5.3)
SODIUM: 144 meq/L (ref 137–147)
TOTAL PROTEIN: 6.4 g/dL (ref 6.0–8.3)
Total Bilirubin: 0.5 mg/dL (ref 0.3–1.2)

## 2014-01-19 LAB — APTT
aPTT: 103 seconds — ABNORMAL HIGH (ref 24–37)
aPTT: 53 seconds — ABNORMAL HIGH (ref 24–37)

## 2014-01-19 SURGERY — LEFT HEART CATHETERIZATION WITH CORONARY ANGIOGRAM
Anesthesia: LOCAL

## 2014-01-19 MED ORDER — SODIUM CHLORIDE 0.9 % IJ SOLN: 3.0000 mL | Freq: Two times a day (BID) | INTRAMUSCULAR | Status: DC

## 2014-01-19 MED ORDER — VERAPAMIL HCL 2.5 MG/ML IV SOLN
INTRAVENOUS | Status: AC
  Filled 2014-01-19: qty 2

## 2014-01-19 MED ORDER — SODIUM CHLORIDE 0.9 % IJ SOLN: 3.0000 mL | INTRAMUSCULAR | Status: DC | PRN

## 2014-01-19 MED ORDER — SODIUM CHLORIDE 0.9 % IV SOLN: 250.0000 mL | INTRAVENOUS | Status: DC | PRN

## 2014-01-19 MED ORDER — NITROGLYCERIN 0.2 MG/ML ON CALL CATH LAB
INTRAVENOUS | Status: AC
  Filled 2014-01-19: qty 1

## 2014-01-19 MED ORDER — MORPHINE SULFATE 2 MG/ML IJ SOLN: 1.0000 mg | INTRAMUSCULAR | Status: DC | PRN

## 2014-01-19 MED ORDER — FENTANYL CITRATE 0.05 MG/ML IJ SOLN
INTRAMUSCULAR | Status: AC
  Filled 2014-01-19: qty 2

## 2014-01-19 MED ORDER — HEPARIN (PORCINE) IN NACL 2-0.9 UNIT/ML-% IJ SOLN
INTRAMUSCULAR | Status: AC
  Filled 2014-01-19: qty 1000

## 2014-01-19 MED ORDER — MIDAZOLAM HCL 2 MG/2ML IJ SOLN
INTRAMUSCULAR | Status: AC
  Filled 2014-01-19: qty 2

## 2014-01-19 MED ORDER — SODIUM CHLORIDE 0.9 % IV SOLN: 1.0000 mL/kg/h | INTRAVENOUS | Status: AC

## 2014-01-19 MED ORDER — SODIUM CHLORIDE 0.9 % IJ SOLN
3.0000 mL | Freq: Two times a day (BID) | INTRAMUSCULAR | Status: DC
Start: 2014-01-19 — End: 2014-01-19

## 2014-01-19 MED ORDER — LIDOCAINE HCL (PF) 1 % IJ SOLN
INTRAMUSCULAR | Status: AC
  Filled 2014-01-19: qty 30

## 2014-01-19 NOTE — CV Procedure (Addendum)
CARDIAC CATHETERIZATION REPORT  NAME:  Jamie Coffey   MRN: [id number] DOB:  1941/03/24   ADMIT DATE: 01/18/2014 Procedure Date: 01/19/2014  INTERVENTIONAL CARDIOLOGIST: [name], M.D., MS PRIMARY CARE PROVIDER: [name], MD PRIMARY CARDIOLOGIST:  Fransico Him, M.D.  PATIENT:  Jamie Coffey is a 73 y.o. female with a past medical history of PAF and factor V Leiden deficiency who presented to the [facility] Emergency Room yesterday evening with chest pain concerning for unstable angina. She was last seen in clinic in April by Dr. Fransico Him and was noted to have prominent inferolateral ST-T wave changes without symptoms. She had a Myoview stress test in May with normal function and no evidence of ischemia. There was concern with her now presenting with with outlet unstable angina that her stress test was false negative.  PRE-OPERATIVE DIAGNOSIS:    Unstable Angina  PROCEDURES PERFORMED:    Left Heart Catheterization with Native Coronary Angiography  via Right Radial Artery   PROCEDURE: The patient was brought to the 2nd [facility] Catheterization Lab in the fasting state and prepped and draped in the usual sterile fashion for Right Radial artery access. A modified Allen's test was performed on the Right wrist demonstrating excellent collateral flow for radial access.   Sterile technique was used including antiseptics, cap, gloves, gown, hand hygiene, mask and sheet. Skin prep: Chlorhexidine.   Consent: Risks of procedure as well as the alternatives and risks of each were explained to the (patient/caregiver). Consent for procedure obtained.   Time Out: Verified patient identification, verified procedure, site/side was marked, verified correct patient position, special equipment/implants available, medications/allergies/relevent history reviewed, required imaging and test results available. Performed.  Access:   Right Radial Artery: 6 Fr Sheath -   Seldinger Technique (Angiocath Micropuncture Kit)  Radial Cocktail - 10 mL; IV Heparin 3500   Left Heart Catheterization: I Fr Catheters advanced or exchanged over a Long Exchange Safety J-wire Left and Right Coronary Artery Cineangiography: TIG 4.0 Catheter   LV Hemodynamics (LV Gram): Aortic valve crossed using TIG 4.0 catheter, exchanged for Angled Pigtail catheter  Sheath removed in the Cath Lab a TR band placed..  TR Band: 1700  Hours; 13 mL air  FINDINGS:  Hemodynamics:   Central Aortic Pressure / Mean: 144/61/93 mmHg  Left Ventricular Pressure / LVEDP: 143/4/11 mmHg  Left Ventriculography:  EF: Roughly 65 %  Wall Motion: Hyperdynamic  Coronary Anatomy: Right Dominant  Left Main: Large-caliber vessel that bifurcates into the LAD and Circumflex. Angiographically normal. LAD: Normal caliber vessel with a very proximal diagonal branch. Just at the diagonal branch has a focal eccentric roughly 40% stenosis. This is just before the takeoff of a second diagonal branch. The vessel it courses down to the apex but does not wrap the apex.  Beyond D2 there are no further lesions.  D1: Small-Moderate caliber vessel with proximal 50% stenosis.  D2: Small-caliber vessel with ostial 60% stenosis. Otherwise tortuous but angiographically normal. Left Circumflex: Large-caliber vessel that gives off a very small branch proximally and then bifurcates just before going into the AV groove into lateral OM1 branch in the AV groove circumflex which terminates distally into 2 small posterior lateral branches. There is a very small AV groove circumflex that comes off at after OM1.  OM1: Moderate caliber vessel, tortuous distally. Minimal luminal irregularities.  OM 2: This is actually the parent vessel of the circumflex system that has a small branch prior to bifurcating into 2  posterolateral branches as described.   RCA: Large caliber, dominant vessel with a sharp bend proximally, the vessel  remains relatively large in diameter throughout. It bifurcates distally into a smaller moderate caliber Right Posterior Ascending Artery (RPDA) and a moderate to large caliber  Right Posterior AV Groove Branch (RPAV). Angiographically normal  RPDA: Tortuous vessel, minimal luminal irregularities. Does not reach the apex  RPL Sysytem:The RPAV essentially terminates as a large, extensive posterior lateral branch with several small branches occluded the AV nodal artery.  After reviewing the initial angiography, no culprit lesion was identified.     MEDICATIONS:  Anesthesia:  Local Lidocaine 2 ml  Sedation:  1 mg IV Versed, 25 mcg IV fentanyl ;   Omnipaque Contrast: 80 ml  Anticoagulation:  IV Heparin 3500 Units ;  Radial Cocktail: 5 mg Verapamil, 400 mcg NTG, 2 ml 2% Lidocaine in 10 ml NS  PATIENT DISPOSITION:    The patient was transferred to the PACU holding area in a hemodynamicaly stable, chest pain free condition.  The patient tolerated the procedure well, and there were no complications.  EBL:   < 5 ml  The patient was stable before, during, and after the procedure.  POST-OPERATIVE DIAGNOSIS:    Angiographically only mild-moderate coronary artery disease involving the mid LAD and second diagonal branch.   Well-preserved LV function with normal LVEDP.  Likely non-anginal/non-coronary artery disease-related chest pain  PLAN OF CARE:  The patient will return to the nursing unit for post radial cath care.   Would anticipate discharge later on today.  She will followup with Dr. Fransico Him.   [name], M.D., M.S. [facility] [address] [place], [place]  [address]  [phone]  01/19/2014 5:06 PM

## 2014-01-19 NOTE — H&P (View-Only) (Signed)
Patient Name: Jamie Coffey Date of Encounter: 01/19/2014     Active Problems:   Unstable angina    SUBJECTIVE  Denies any chest pain since admission. Denies any SOB. No bleeding issue, no upcoming surgery.   CURRENT MEDS . amLODipine  2.5 mg Oral Daily  . aspirin EC  81 mg Oral Daily  . atorvastatin  40 mg Oral q1800  . levothyroxine  75 mcg Oral QAC breakfast  . metoprolol tartrate  12.5 mg Oral BID  . sodium chloride  3 mL Intravenous Q12H  . sodium chloride  3 mL Intravenous Q12H  . venlafaxine XR  75 mg Oral QPM    OBJECTIVE  Filed Vitals:   01/18/14 1410 01/18/14 1932 01/18/14 2011 01/19/14 0537  BP: 141/82 144/64 149/66 128/65  Pulse: 65 57 59 54  Temp:   97.9 F (36.6 C) 98.3 F (36.8 C)  TempSrc:   Oral Oral  Resp: 18 18 18 18   Height:   5\' 6"  (1.676 m)   Weight:   144 lb 3.2 oz (65.409 kg) 143 lb 12.8 oz (65.227 kg)  SpO2: 97% 98% 100% 95%   No intake or output data in the 24 hours ending 01/19/14 0836 Filed Weights   01/18/14 2011 01/19/14 0537  Weight: 144 lb 3.2 oz (65.409 kg) 143 lb 12.8 oz (65.227 kg)    PHYSICAL EXAM  General: Pleasant, NAD. Neuro: Alert and oriented X 3. Moves all extremities spontaneously. Psych: Normal affect. HEENT:  Normal  Neck: Supple without bruits or JVD. Lungs:  Resp regular and unlabored, CTA. Heart: RRR no s3, s4, or murmurs. Abdomen: Soft, non-tender, non-distended, BS + x 4.  Extremities: No clubbing, cyanosis or edema. DP/PT/Radials 2+ and equal bilaterally.  Accessory Clinical Findings  CBC  Recent Labs  01/18/14 1019 01/19/14 0347  WBC 5.0 4.6  HGB 13.3 13.0  HCT 39.8 38.7  MCV 90.2 91.3  PLT 222 401   Basic Metabolic Panel  Recent Labs  01/18/14 1019 01/19/14 0347  NA 142 144  K 3.8 4.2  CL 99 104  CO2 25 27  GLUCOSE 86 85  BUN 21 17  CREATININE 0.78 0.73  CALCIUM 9.4 9.2   Liver Function Tests  Recent Labs  01/19/14 0347  AST 16  ALT 11  ALKPHOS 80  BILITOT 0.5  PROT  6.4  ALBUMIN 3.6   No results found for this basename: LIPASE, AMYLASE,  in the last 72 hours Cardiac Enzymes  Recent Labs  01/18/14 1057 01/18/14 2127 01/19/14 0347  TROPONINI <0.30 <0.30 <0.30   BNP No components found with this basename: POCBNP,  D-Dimer  Recent Labs  01/18/14 2127  DDIMER <0.27   Hemoglobin A1C No results found for this basename: HGBA1C,  in the last 72 hours Fasting Lipid Panel  Recent Labs  01/19/14 0347  CHOL 198  HDL 105  LDLCALC 85  TRIG 40  CHOLHDL 1.9    TELE  NSR with ventricular rate in 50s, no significant ventricular ectopy  ECG  NSR with HR 50s, minimal ST depression in lateral leads, less prominent compare to Apr EKG  Radiology/Studies  Dg Chest 2 View  01/18/2014   CLINICAL DATA:  Chest pain  EXAM: CHEST  2 VIEW  COMPARISON:  11/28/2012  FINDINGS: The heart size and mediastinal contours are within normal limits. Both lungs are clear. The visualized skeletal structures are unremarkable.  IMPRESSION: No active cardiopulmonary disease.   Electronically Signed   By: Juanda Crumble  Carlis Abbott M.D.   On: 01/18/2014 11:36    ASSESSMENT AND PLAN  1. [place]  - abnormal EKG with inferolateral ST/T wave changes noted in Apr, however negative stress test in May  - recurrent chest pressure with dyspnea during exertion on 01/18/2014  - plan for cardiac catheterization today   - risk and benefit of the procedure explained to the patient and her husband, she display clear understanding and agree to proceed  - may consider BMS if need intervention as patient will need to be placed on Xarelto for her [place] upon discharge  2. PAF, no recurrence during current admission  - on Xarelto  3. Factor V Leidon with h/o DVT and PE  - d-dimer negative  - on Xarelto, off since 01/18/2014 pending potential cath, currently on heparin  Signed, Almyra Deforest PA Pager: [phone]  Patient seen and examined. Agree with assessment and plan. No further cp; last  dose of xarelto was 6/10. Plan cath later today; if stent necessary since pt is on chronic xarelto, use plavix rather than effient or ticagrelor due to potential bleed risk with more potent antiplatelet RX and NOAC, and probable BMS.   Troy Sine, MD, [facility] 01/19/2014 9:13 AM

## 2014-01-19 NOTE — Progress Notes (Signed)
Report received from [facility] in Cath lab.  Awaiting patients arrival.  Dirk Dress 01/19/2014 5:21 PM

## 2014-01-19 NOTE — Progress Notes (Signed)
Patient discharged to home.  Patient alert, oriented, verbally responsive, breathing regular and non-labored throughout, no s/s of distress noted throughout, no c/o pain throughout.  Discharge instructions thoroughly verbalized to patient and husband at bedside.  TR Band radial artery site home care instructions thoroughly verbalized to patient.  Patient verbalized understanding throughout.  Patient to be wheeled out of by night shift nurses.  VS WNL.  Dirk Dress 01/19/2014 8:11 PM

## 2014-01-19 NOTE — Discharge Instructions (Signed)
Chest Pain (Nonspecific) °It is often hard to give a specific diagnosis for the cause of chest pain. There is always a chance that your pain could be related to something serious, such as a heart attack or a blood clot in the lungs. You need to follow up with your caregiver for further evaluation. °CAUSES  °· Heartburn. °· Pneumonia or bronchitis. °· Anxiety or stress. °· Inflammation around your heart (pericarditis) or lung (pleuritis or pleurisy). °· A blood clot in the lung. °· A collapsed lung (pneumothorax). It can develop suddenly on its own (spontaneous pneumothorax) or from injury (trauma) to the chest. °· Shingles infection (herpes zoster virus). °The chest wall is composed of bones, muscles, and cartilage. Any of these can be the source of the pain. °· The bones can be bruised by injury. °· The muscles or cartilage can be strained by coughing or overwork. °· The cartilage can be affected by inflammation and become sore (costochondritis). °DIAGNOSIS  °Lab tests or other studies, such as X-rays, electrocardiography, stress testing, or cardiac imaging, may be needed to find the cause of your pain.  °TREATMENT  °· Treatment depends on what may be causing your chest pain. Treatment may include: °· Acid blockers for heartburn. °· Anti-inflammatory medicine. °· Pain medicine for inflammatory conditions. °· Antibiotics if an infection is present. °· You may be advised to change lifestyle habits. This includes stopping smoking and avoiding alcohol, caffeine, and chocolate. °· You may be advised to keep your head raised (elevated) when sleeping. This reduces the chance of acid going backward from your stomach into your esophagus. °· Most of the time, nonspecific chest pain will improve within 2 to 3 days with rest and mild pain medicine. °HOME CARE INSTRUCTIONS  °· If antibiotics were prescribed, take your antibiotics as directed. Finish them even if you start to feel better. °· For the next few days, avoid physical  activities that bring on chest pain. Continue physical activities as directed. °· Do not smoke. °· Avoid drinking alcohol. °· Only take over-the-counter or prescription medicine for pain, discomfort, or fever as directed by your caregiver. °· Follow your caregiver's suggestions for further testing if your chest pain does not go away. °· Keep any follow-up appointments you made. If you do not go to an appointment, you could develop lasting (chronic) problems with pain. If there is any problem keeping an appointment, you must call to reschedule. °SEEK MEDICAL CARE IF:  °· You think you are having problems from the medicine you are taking. Read your medicine instructions carefully. °· Your chest pain does not go away, even after treatment. °· You develop a rash with blisters on your chest. °SEEK IMMEDIATE MEDICAL CARE IF:  °· You have increased chest pain or pain that spreads to your arm, neck, jaw, back, or abdomen. °· You develop shortness of breath, an increasing cough, or you are coughing up blood. °· You have severe back or abdominal pain, feel nauseous, or vomit. °· You develop severe weakness, fainting, or chills. °· You have a fever. °THIS IS AN EMERGENCY. Do not wait to see if the pain will go away. Get medical help at once. Call your local emergency services (911 in [place] Do not drive yourself to the hospital. °MAKE SURE YOU:  °· Understand these instructions. °· Will watch your condition. °· Will get help right away if you are not doing well or get worse. °Document Released: 05/06/2005 Document Revised: 10/19/2011 Document Reviewed: 03/01/2008 °ExitCare® Patient Information ©2014 [employer]   [employer]. Radial Site Care Refer to this sheet in the next few weeks. These instructions provide you with information on caring for yourself after your procedure. Your caregiver may also give you more specific instructions. Your treatment has been planned according to current medical practices, but problems sometimes occur.  Call your caregiver if you have any problems or questions after your procedure. HOME CARE INSTRUCTIONS  You may shower the day after the procedure.Remove the bandage (dressing) and gently wash the site with plain soap and water.Gently pat the site dry.  Do not apply powder or lotion to the site.  Do not submerge the affected site in water for 3 to 5 days.  Inspect the site at least twice daily.  Do not flex or bend the affected arm for 24 hours.  No lifting over 5 pounds (2.3 kg) for 5 days after your procedure.  Do not drive home if you are discharged the same day of the procedure. Have someone else drive you.  You may drive 24 hours after the procedure unless otherwise instructed by your caregiver.  Do not operate machinery or power tools for 24 hours.  A responsible adult should be with you for the first 24 hours after you arrive home. What to expect:  Any bruising will usually fade within 1 to 2 weeks.  Blood that collects in the tissue (hematoma) may be painful to the touch. It should usually decrease in size and tenderness within 1 to 2 weeks. SEEK IMMEDIATE MEDICAL CARE IF:  You have unusual pain at the radial site.  You have redness, warmth, swelling, or pain at the radial site.  You have drainage (other than a small amount of blood on the dressing).  You have chills.  You have a fever or persistent symptoms for more than 72 hours.  You have a fever and your symptoms suddenly get worse.  Your arm becomes pale, cool, tingly, or numb.  You have heavy bleeding from the site. Hold pressure on the site. Document Released: 08/29/2010 Document Revised: 10/19/2011 Document Reviewed: 08/29/2010 [facility] Patient Information 2014 [place], [place].

## 2014-01-19 NOTE — Progress Notes (Addendum)
ANTICOAGULATION CONSULT NOTE - Follow Up Consult  Pharmacy Consult for Heparin  Indication: chest pain/ACS  Labs:  Recent Labs  01/18/14 1019 01/18/14 1057 01/18/14 2127 01/19/14 0347  HGB 13.3  --   --  13.0  HCT 39.8  --   --  38.7  PLT 222  --   --  214  LABPROT  --   --   --  14.9  INR  --   --   --  1.20  HEPARINUNFRC  --   --   --  0.98*  CREATININE 0.78  --   --  0.73  TROPONINI  --  <0.30 <0.30 <0.30   Assessment: HL of 0.98 likely elevated due to Xarelto influence given that last dose was 6/10 ~2200, aPTT not ordered.   Plan:  -Add-on aPTT  -Given results of aPTT, will decide whether to dose using aPTT of HL  Lauralei Clouse 01/19/2014,5:35 AM  Addendum 6:12 AM APTT is 103, so will use aPTT to guide dosing for now.  -Decrease heparin drip to 700 units/hr -1400 aPTT/HL -Daily aPTT/HL/CBC -Monitor for bleeding

## 2014-01-19 NOTE — Interval H&P Note (Signed)
History and Physical Interval Note:  01/19/2014 4:26 PM  Jamie Coffey  has presented today for surgery, with the diagnosis of Unstable Angina.  The various methods of treatment have been discussed with the patient and family. After consideration of risks, benefits and other options for treatment, the patient has consented to  Procedure(s): LEFT HEART CATHETERIZATION WITH CORONARY ANGIOGRAM (N/A) +/- PCI  as a surgical intervention .  The patient's history has been reviewed, patient examined, no change in status, stable for surgery.  I have reviewed the patient's chart and labs.  Questions were answered to the patient's satisfaction.     HARDING,DAVID W  Cath Lab Visit (complete for each Cath Lab visit)  Clinical Evaluation Leading to the Procedure:   ACS: yes  Non-ACS:    Anginal Classification: CCS III  Anti-ischemic medical therapy: Maximal Therapy (2 or more classes of medications)  Non-Invasive Test Results: No non-invasive testing performed  Prior CABG: No previous CABG

## 2014-01-19 NOTE — Progress Notes (Signed)
Patient Name: Jamie Coffey Date of Encounter: 01/19/2014     Active Problems:   Unstable angina    SUBJECTIVE  Denies any chest pain since admission. Denies any SOB. No bleeding issue, no upcoming surgery.   CURRENT MEDS . amLODipine  2.5 mg Oral Daily  . aspirin EC  81 mg Oral Daily  . atorvastatin  40 mg Oral q1800  . levothyroxine  75 mcg Oral QAC breakfast  . metoprolol tartrate  12.5 mg Oral BID  . sodium chloride  3 mL Intravenous Q12H  . sodium chloride  3 mL Intravenous Q12H  . venlafaxine XR  75 mg Oral QPM    OBJECTIVE  Filed Vitals:   01/18/14 1410 01/18/14 1932 01/18/14 2011 01/19/14 0537  BP: 141/82 144/64 149/66 128/65  Pulse: 65 57 59 54  Temp:   97.9 F (36.6 C) 98.3 F (36.8 C)  TempSrc:   Oral Oral  Resp: 18 18 18 18   Height:   5\' 6"  (1.676 m)   Weight:   144 lb 3.2 oz (65.409 kg) 143 lb 12.8 oz (65.227 kg)  SpO2: 97% 98% 100% 95%   No intake or output data in the 24 hours ending 01/19/14 0836 Filed Weights   01/18/14 2011 01/19/14 0537  Weight: 144 lb 3.2 oz (65.409 kg) 143 lb 12.8 oz (65.227 kg)    PHYSICAL EXAM  General: Pleasant, NAD. Neuro: Alert and oriented X 3. Moves all extremities spontaneously. Psych: Normal affect. HEENT:  Normal  Neck: Supple without bruits or JVD. Lungs:  Resp regular and unlabored, CTA. Heart: RRR no s3, s4, or murmurs. Abdomen: Soft, non-tender, non-distended, BS + x 4.  Extremities: No clubbing, cyanosis or edema. DP/PT/Radials 2+ and equal bilaterally.  Accessory Clinical Findings  CBC  Recent Labs  01/18/14 1019 01/19/14 0347  WBC 5.0 4.6  HGB 13.3 13.0  HCT 39.8 38.7  MCV 90.2 91.3  PLT 222 643   Basic Metabolic Panel  Recent Labs  01/18/14 1019 01/19/14 0347  NA 142 144  K 3.8 4.2  CL 99 104  CO2 25 27  GLUCOSE 86 85  BUN 21 17  CREATININE 0.78 0.73  CALCIUM 9.4 9.2   Liver Function Tests  Recent Labs  01/19/14 0347  AST 16  ALT 11  ALKPHOS 80  BILITOT 0.5  PROT  6.4  ALBUMIN 3.6   No results found for this basename: LIPASE, AMYLASE,  in the last 72 hours Cardiac Enzymes  Recent Labs  01/18/14 1057 01/18/14 2127 01/19/14 0347  TROPONINI <0.30 <0.30 <0.30   BNP No components found with this basename: POCBNP,  D-Dimer  Recent Labs  01/18/14 2127  DDIMER <0.27   Hemoglobin A1C No results found for this basename: HGBA1C,  in the last 72 hours Fasting Lipid Panel  Recent Labs  01/19/14 0347  CHOL 198  HDL 105  LDLCALC 85  TRIG 40  CHOLHDL 1.9    TELE  NSR with ventricular rate in 50s, no significant ventricular ectopy  ECG  NSR with HR 50s, minimal ST depression in lateral leads, less prominent compare to Apr EKG  Radiology/Studies  Dg Chest 2 View  01/18/2014   CLINICAL DATA:  Chest pain  EXAM: CHEST  2 VIEW  COMPARISON:  11/28/2012  FINDINGS: The heart size and mediastinal contours are within normal limits. Both lungs are clear. The visualized skeletal structures are unremarkable.  IMPRESSION: No active cardiopulmonary disease.   Electronically Signed   By: Juanda Crumble  Carlis Abbott M.D.   On: 01/18/2014 11:36    ASSESSMENT AND PLAN  1. [place]  - abnormal EKG with inferolateral ST/T wave changes noted in Apr, however negative stress test in May  - recurrent chest pressure with dyspnea during exertion on 01/18/2014  - plan for cardiac catheterization today   - risk and benefit of the procedure explained to the patient and her husband, she display clear understanding and agree to proceed  - may consider BMS if need intervention as patient will need to be placed on Xarelto for her [place] upon discharge  2. PAF, no recurrence during current admission  - on Xarelto  3. 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 Agree with assessment and plan. No further cp; last  dose of xarelto was 6/10. Plan cath later today; if stent necessary since pt is on chronic xarelto, use plavix rather than effient or ticagrelor due to potential bleed risk with more potent antiplatelet RX and NOAC, and probable BMS.   Troy Sine, MD, [facility] 01/19/2014 9:13 AM

## 2014-01-19 NOTE — Discharge Summary (Signed)
Patient ID: Jamie Coffey,  MRN: [id number], DOB/AGE: February 22, 1941 73 y.o.  Admit date: 01/18/2014 Discharge date: 01/19/2014  Primary Care Provider:  Primary Cardiologist: Dr Radford Pax  Discharge Diagnoses Principal Problem:   Non-cardiac chest pain Active Problems:   Factor 5 Leiden mutation, heterozygous   Hypertension   PAF (paroxysmal atrial fibrillation)   Abnormal EKG   Unstable angina   FH: factor V Leiden mutation   History of pulmonary embolism   Chronic anticoagulation- Xarelto   Normal coronary arteries- 01/19/14    Procedures: Coronary angiogram 01/19/14 - no obstructive CAD   Hospital Course:  73 year old female with the above complex problem list. She has both a history paroxysmal atrial fibrillation and factor V Leiden on chronic anticoagulation with Xarelto. She was last seen in clinic in April of this year, at which time she was noted to have more prominent inferolateral ST and T-wave changes without symptoms. She was set up for an exercise Myoview which was performed in early May, and showed normal LV function without evidence of ischemia or infarct. She is very active at home, walking several days per week. She typically walks a 15 minute mile come off accomplish 2+ miles. She usually does not experience any limitations when walking.           She presented 01/18/14 with chest pain worrisome for [place]. She was admitted and Xarelto was held. She ruled out for an MI. Cath by Dr Ellyn Hack revealed normal coronaries. She was discharged late on the 12th and will follow up in the office.   Discharge Vitals:  Blood pressure 153/77, pulse 58, temperature 98.3 F (36.8 C), temperature source Oral, resp. rate 18, height $RemoveBe'5\' 6"'JgsoeKlnN$  (1.676 m), weight 143 lb 12.8 oz (65.227 kg), SpO2 98.00%.    Labs: Results for orders placed during the hospital encounter of 01/18/14 (from the past 48 hour(s))  CBC     Status: None   Collection Time    01/18/14 10:19 AM      Result Value Ref  Range   WBC 5.0  4.0 - 10.5 K/uL   RBC 4.41  3.87 - 5.11 MIL/uL   Hemoglobin 13.3  12.0 - 15.0 g/dL   HCT 39.8  36.0 - 46.0 %   MCV 90.2  78.0 - 100.0 fL   MCH 30.2  26.0 - 34.0 pg   MCHC 33.4  30.0 - 36.0 g/dL   RDW 13.1  11.5 - 15.5 %   Platelets 222  150 - 400 K/uL  BASIC METABOLIC PANEL     Status: Abnormal   Collection Time    01/18/14 10:19 AM      Result Value Ref Range   Sodium 142  137 - 147 mEq/L   Potassium 3.8  3.7 - 5.3 mEq/L   Chloride 99  96 - 112 mEq/L   CO2 25  19 - 32 mEq/L   Glucose, Bld 86  70 - 99 mg/dL   BUN 21  6 - 23 mg/dL   Creatinine, Ser 0.78  0.50 - 1.10 mg/dL   Calcium 9.4  8.4 - 10.5 mg/dL   GFR calc non Af Amer 82 (*) >90 mL/min   GFR calc Af Amer >90  >90 mL/min   Comment: (NOTE)     The eGFR has been calculated using the CKD EPI equation.     This calculation has not been validated in all clinical situations.     eGFR's persistently <90 mL/min signify possible Chronic  Kidney     Disease.  TROPONIN I     Status: None   Collection Time    01/18/14 10:57 AM      Result Value Ref Range   Troponin I <0.30  <0.30 ng/mL   Comment:            Due to the release kinetics of cTnI,     a negative result within the first hours     of the onset of symptoms does not rule out     myocardial infarction with certainty.     If myocardial infarction is still suspected,     repeat the test at appropriate intervals.  CBG MONITORING, ED     Status: None   Collection Time    01/18/14 11:39 AM      Result Value Ref Range   Glucose-Capillary 75  70 - 99 mg/dL  I-STAT TROPOININ, ED     Status: None   Collection Time    01/18/14  2:56 PM      Result Value Ref Range   Troponin i, poc 0.00  0.00 - 0.08 ng/mL   Comment 3            Comment: Due to the release kinetics of cTnI,     a negative result within the first hours     of the onset of symptoms does not rule out     myocardial infarction with certainty.     If myocardial infarction is still suspected,       repeat the test at appropriate intervals.  D-DIMER, QUANTITATIVE     Status: None   Collection Time    01/18/14  9:27 PM      Result Value Ref Range   D-Dimer, Quant <0.27  0.00 - 0.48 ug/mL-FEU   Comment:            AT THE INHOUSE ESTABLISHED CUTOFF     VALUE OF 0.48 ug/mL FEU,     THIS ASSAY HAS BEEN DOCUMENTED     IN THE LITERATURE TO HAVE     A SENSITIVITY AND NEGATIVE     PREDICTIVE VALUE OF AT LEAST     98 TO 99%.  THE TEST RESULT     SHOULD BE CORRELATED WITH     AN ASSESSMENT OF THE CLINICAL     PROBABILITY OF DVT / VTE.  TROPONIN I     Status: None   Collection Time    01/18/14  9:27 PM      Result Value Ref Range   Troponin I <0.30  <0.30 ng/mL   Comment:            Due to the release kinetics of cTnI,     a negative result within the first hours     of the onset of symptoms does not rule out     myocardial infarction with certainty.     If myocardial infarction is still suspected,     repeat the test at appropriate intervals.  HEPARIN LEVEL (UNFRACTIONATED)     Status: Abnormal   Collection Time    01/19/14  3:47 AM      Result Value Ref Range   Heparin Unfractionated 0.98 (*) 0.30 - 0.70 IU/mL   Comment: RESULTS CONFIRMED BY MANUAL DILUTION                IF HEPARIN RESULTS ARE BELOW     EXPECTED VALUES, AND PATIENT  DOSAGE HAS BEEN CONFIRMED,     SUGGEST FOLLOW UP TESTING     OF ANTITHROMBIN III LEVELS.  CBC     Status: None   Collection Time    01/19/14  3:47 AM      Result Value Ref Range   WBC 4.6  4.0 - 10.5 K/uL   RBC 4.24  3.87 - 5.11 MIL/uL   Hemoglobin 13.0  12.0 - 15.0 g/dL   HCT 38.7  36.0 - 46.0 %   MCV 91.3  78.0 - 100.0 fL   MCH 30.7  26.0 - 34.0 pg   MCHC 33.6  30.0 - 36.0 g/dL   RDW 13.5  11.5 - 15.5 %   Platelets 214  150 - 400 K/uL  TROPONIN I     Status: None   Collection Time    01/19/14  3:47 AM      Result Value Ref Range   Troponin I <0.30  <0.30 ng/mL   Comment:            Due to the release kinetics of cTnI,      a negative result within the first hours     of the onset of symptoms does not rule out     myocardial infarction with certainty.     If myocardial infarction is still suspected,     repeat the test at appropriate intervals.  COMPREHENSIVE METABOLIC PANEL     Status: Abnormal   Collection Time    01/19/14  3:47 AM      Result Value Ref Range   Sodium 144  137 - 147 mEq/L   Potassium 4.2  3.7 - 5.3 mEq/L   Chloride 104  96 - 112 mEq/L   CO2 27  19 - 32 mEq/L   Glucose, Bld 85  70 - 99 mg/dL   BUN 17  6 - 23 mg/dL   Creatinine, Ser 0.73  0.50 - 1.10 mg/dL   Calcium 9.2  8.4 - 10.5 mg/dL   Total Protein 6.4  6.0 - 8.3 g/dL   Albumin 3.6  3.5 - 5.2 g/dL   AST 16  0 - 37 U/L   ALT 11  0 - 35 U/L   Alkaline Phosphatase 80  39 - 117 U/L   Total Bilirubin 0.5  0.3 - 1.2 mg/dL   GFR calc non Af Amer 84 (*) >90 mL/min   GFR calc Af Amer >90  >90 mL/min   Comment: (NOTE)     The eGFR has been calculated using the CKD EPI equation.     This calculation has not been validated in all clinical situations.     eGFR's persistently <90 mL/min signify possible Chronic Kidney     Disease.  LIPID PANEL     Status: None   Collection Time    01/19/14  3:47 AM      Result Value Ref Range   Cholesterol 198  0 - 200 mg/dL   Triglycerides 40  <150 mg/dL   HDL 105  >39 mg/dL   Total CHOL/HDL Ratio 1.9     VLDL 8  0 - 40 mg/dL   LDL Cholesterol 85  0 - 99 mg/dL   Comment:            Total Cholesterol/HDL:CHD Risk     Coronary Heart Disease Risk Table  Men   Women      1/2 Average Risk   3.4   3.3      Average Risk       5.0   4.4      2 X Average Risk   9.6   7.1      3 X Average Risk  23.4   11.0                Use the calculated Patient Ratio     above and the CHD Risk Table     to determine the patient's CHD Risk.                ATP III CLASSIFICATION (LDL):      <100     mg/dL   Optimal      100-129  mg/dL   Near or Above                        Optimal       130-159  mg/dL   Borderline      160-189  mg/dL   High      >190     mg/dL   Very High  PROTIME-INR     Status: None   Collection Time    01/19/14  3:47 AM      Result Value Ref Range   Prothrombin Time 14.9  11.6 - 15.2 seconds   INR 1.20  0.00 - 1.49  APTT     Status: Abnormal   Collection Time    01/19/14  3:47 AM      Result Value Ref Range   aPTT 103 (*) 24 - 37 seconds   Comment:            IF BASELINE aPTT IS ELEVATED,     SUGGEST PATIENT RISK ASSESSMENT     BE USED TO DETERMINE APPROPRIATE     ANTICOAGULANT THERAPY.  TROPONIN I     Status: None   Collection Time    01/19/14  8:25 AM      Result Value Ref Range   Troponin I <0.30  <0.30 ng/mL   Comment:            Due to the release kinetics of cTnI,     a negative result within the first hours     of the onset of symptoms does not rule out     myocardial infarction with certainty.     If myocardial infarction is still suspected,     repeat the test at appropriate intervals.  APTT     Status: Abnormal   Collection Time    01/19/14  2:15 PM      Result Value Ref Range   aPTT 53 (*) 24 - 37 seconds   Comment:            IF BASELINE aPTT IS ELEVATED,     SUGGEST PATIENT RISK ASSESSMENT     BE USED TO DETERMINE APPROPRIATE     ANTICOAGULANT THERAPY.  HEPARIN LEVEL (UNFRACTIONATED)     Status: Abnormal   Collection Time    01/19/14  2:15 PM      Result Value Ref Range   Heparin Unfractionated 0.71 (*) 0.30 - 0.70 IU/mL   Comment:            IF HEPARIN RESULTS ARE BELOW     EXPECTED VALUES, AND PATIENT     DOSAGE HAS BEEN  CONFIRMED,     SUGGEST FOLLOW UP TESTING     OF ANTITHROMBIN III LEVELS.    Disposition:      Follow-up Information   Follow up with Sueanne Margarita, MD. (offcie will call you for follow up)    Specialty:  Cardiology   Contact information:   [address] [place] [address] [phone]       Discharge Medications:    Medication List         amLODipine 2.5 MG  tablet  Commonly known as:  NORVASC  Take 2.5 mg by mouth daily.     celecoxib 200 MG capsule  Commonly known as:  CELEBREX  Take 200 mg by mouth daily. $RemoveBefo'200mg'NZhaudjGoyY$  in the morning Monday through Friday. Does not take on the weekend     desvenlafaxine 50 MG 24 hr tablet  Commonly known as:  PRISTIQ  Take 50 mg by mouth daily.     levothyroxine 75 MCG tablet  Commonly known as:  SYNTHROID, LEVOTHROID  Take 75 mcg by mouth daily before breakfast.     metoprolol tartrate 25 MG tablet  Commonly known as:  LOPRESSOR  Take 12.5-25 mg by mouth daily as needed (for palpitations). Take 12.5-25 mg by mouth for palpitations lasting longer than 30 minutes     rivaroxaban 20 MG Tabs tablet  Commonly known as:  XARELTO  Take 1 tablet (20 mg total) by mouth daily with supper.         Duration of Discharge Encounter: Greater than 30 minutes including physician time.  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  This would suggest that the CP is non-cardiac in nature.  Ready for d/c following bedrest.  [name], M.D., M.S. Interventional Cardiologist   Pager # [phone] 01/19/2014

## 2014-01-20 ENCOUNTER — Telehealth: Payer: Self-pay | Admitting: Physician Assistant

## 2014-01-20 NOTE — ED Provider Notes (Signed)
Medical screening examination/treatment/procedure(s) were conducted as a shared visit with non-physician practitioner(s) and myself.  I personally evaluated the patient during the encounter.   EKG Interpretation   Date/Time:  Thursday January 18 2014 12:50:04 EDT Ventricular Rate:  59 PR Interval:  135 QRS Duration: 88 QT Interval:  433 QTC Calculation: 429 R Axis:   7 Text Interpretation:  Sinus rhythm Minimal ST depression, lateral leads No  significant change was found Confirmed by Lysa Livengood  MD, Lennette Bihari ([id number]) on  01/18/2014 5:07:52 PM      Will ask for cardiology consultation given concerning story of CP with exertion and associated SOB. Prior stress test negative. No definitive coronary imaging ever  Hoy Morn, MD 01/20/14 [phone]

## 2014-01-20 NOTE — ED Provider Notes (Signed)
Medical screening examination/treatment/procedure(s) were performed by non-physician practitioner and as supervising physician I was immediately available for consultation/collaboration.   EKG Interpretation   Date/Time:  Thursday January 18 2014 12:50:04 EDT Ventricular Rate:  59 PR Interval:  135 QRS Duration: 88 QT Interval:  433 QTC Calculation: 429 R Axis:   7 Text Interpretation:  Sinus rhythm Minimal ST depression, lateral leads No  significant change was found Confirmed by CAMPOS  MD, Lennette Bihari ([id number]) on  01/18/2014 5:07:52 PM        Fredia Sorrow, MD 01/20/14 1616

## 2014-01-20 NOTE — Telephone Encounter (Signed)
Jamie Coffey is a 73 y.o. female who was recently admitted with chest pain.  LHC demonstrated normal cors.  She had radial access.   She called the answering service with questions about her ability to travel.   She is feeling well without complaints. She may travel. Richardson Dopp, PA-C   01/20/2014 9:51 AM

## 2014-01-30 DIAGNOSIS — M13169 Monoarthritis, not elsewhere classified, unspecified knee: Secondary | ICD-10-CM | POA: Diagnosis not present

## 2014-02-01 ENCOUNTER — Other Ambulatory Visit: Payer: Self-pay

## 2014-02-01 MED ORDER — AMLODIPINE BESYLATE 2.5 MG PO TABS: 2.5000 mg | ORAL_TABLET | Freq: Every day | ORAL | Status: DC

## 2014-02-01 MED ORDER — RIVAROXABAN 20 MG PO TABS: 20.0000 mg | ORAL_TABLET | Freq: Every day | ORAL | Status: DC

## 2014-02-01 MED ORDER — METOPROLOL TARTRATE 25 MG PO TABS: ORAL_TABLET | ORAL | Status: DC

## 2014-02-06 ENCOUNTER — Other Ambulatory Visit: Payer: Self-pay | Admitting: *Deleted

## 2014-02-06 MED ORDER — METOPROLOL TARTRATE 25 MG PO TABS: ORAL_TABLET | ORAL | Status: DC

## 2014-02-06 MED ORDER — RIVAROXABAN 20 MG PO TABS: 20.0000 mg | ORAL_TABLET | Freq: Every day | ORAL | Status: DC

## 2014-02-16 ENCOUNTER — Other Ambulatory Visit: Payer: Self-pay | Admitting: *Deleted

## 2014-02-16 MED ORDER — RIVAROXABAN 20 MG PO TABS: 20.0000 mg | ORAL_TABLET | Freq: Every day | ORAL | Status: DC

## 2014-02-19 ENCOUNTER — Encounter: Payer: Self-pay | Admitting: Physician Assistant

## 2014-02-19 ENCOUNTER — Ambulatory Visit (INDEPENDENT_AMBULATORY_CARE_PROVIDER_SITE_OTHER): Payer: Medicare Other | Admitting: Physician Assistant

## 2014-02-19 VITALS — BP 120/68 | HR 66 | Ht 66.0 in | Wt 148.0 lb

## 2014-02-19 DIAGNOSIS — I2510 Atherosclerotic heart disease of native coronary artery without angina pectoris: Secondary | ICD-10-CM

## 2014-02-19 DIAGNOSIS — I200 Unstable angina: Secondary | ICD-10-CM

## 2014-02-19 DIAGNOSIS — I4891 Unspecified atrial fibrillation: Secondary | ICD-10-CM

## 2014-02-19 DIAGNOSIS — I10 Essential (primary) hypertension: Secondary | ICD-10-CM | POA: Diagnosis not present

## 2014-02-19 DIAGNOSIS — D6859 Other primary thrombophilia: Secondary | ICD-10-CM

## 2014-02-19 DIAGNOSIS — I480 Paroxysmal atrial fibrillation: Secondary | ICD-10-CM

## 2014-02-19 DIAGNOSIS — D6851 Activated protein C resistance: Secondary | ICD-10-CM

## 2014-02-19 NOTE — Progress Notes (Signed)
Cardiology Office Note    Date:  02/19/2014   ID:  Jamie Coffey, DOB 1940/11/18, MRN [id number]  PCP:  [name], MD  Cardiologist:  Dr. Fransico Him      History of Present Illness: Jamie Coffey is a 73 y.o. female with a hx of paroxysmal AFib, Factor V Leiden mutation, HTN, MVP, diastolic dysfunction.  Myoview in 12/2013 demonstrated no ischemia. Patient was admitted 6/11-6/12 with chest pain concerning for unstable angina. Myocardial infarction was ruled out. Cardiac catheterization demonstrated minimal non-obstructive CAD. Medical therapy was recommended.  She returns for follow up.  She is doing well.  She sometimes feels short of breath, especially with singing in a choir.  She otherwise denies significant DOE and is NYHA 2.  She denies orthopnea, PND, edema.  She denies chest pain.  She denies syncope.     Studies:  - LHC (01/19/14):  EF 65%, LAD 40%, proximal D1 50%, ostial D2 60%.  - Echo (05/2011):  EF 70.6%, mild asymmetric septal hypertrophy, grade 1 diastolic dysfunction, trace MR, mild AI, moderate TR, trace PI  - Nuclear (12/11/13):  No ischemia, EF 65%, normal study    Recent Labs: 01/19/2014: ALT 11; Creatinine 0.73; HDL Cholesterol by NMR 105; Hemoglobin 13.0; LDL (calc) 85; Potassium 4.2   Wt Readings from Last 3 Encounters:  01/19/14 143 lb 12.8 oz (65.227 kg)  01/19/14 143 lb 12.8 oz (65.227 kg)  12/11/13 145 lb (65.772 kg)     Past Medical History  Diagnosis Date  . PAF (paroxysmal atrial fibrillation)     a. 05/2011 Echo: EF 70%, triv MR, mild AI, mod TR, Gr 1 DD.  . Factor 5 Leiden mutation, heterozygous     a. chronic xarelto.  . Chest pain, unspecified     a. 12/2013 MV: EF 65%, no ischemia/infarct.  . Mitral valve prolapse   . Dysphagia   . GERD (gastroesophageal reflux disease)   . History of DVT & PE ~ 2000    S/P RLE knee scope  . Depression   . Hypertension   . Breast cancer 2010    "left", s/p XRT  . Hypothyroidism   . H/O  hiatal hernia   . Arthritis     "joints" (01/18/2014)  . Sleep deprivation     "for years and years" (01/18/2014)    Current Outpatient Prescriptions  Medication Sig Dispense Refill  . amLODipine (NORVASC) 2.5 MG tablet Take 1 tablet (2.5 mg total) by mouth daily.  90 tablet  3  . celecoxib (CELEBREX) 200 MG capsule Take 200 mg by mouth daily. 200mg  in the morning Monday through Friday. Does not take on the weekend      . desvenlafaxine (PRISTIQ) 50 MG 24 hr tablet Take 50 mg by mouth daily.      Marland Kitchen levothyroxine (SYNTHROID, LEVOTHROID) 75 MCG tablet Take 75 mcg by mouth daily before breakfast.      . metoprolol tartrate (LOPRESSOR) 25 MG tablet Take 12.5-25 mg by mouth daily as needed for palpitations lasting longer than 30 minutes  10 tablet  0  . rivaroxaban (XARELTO) 20 MG TABS tablet Take 1 tablet (20 mg total) by mouth daily with supper.  90 tablet  1   No current facility-administered medications for this visit.    Allergies:   Review of patient's allergies indicates no known allergies.   Social History:  The patient  reports that she has never smoked. She has never used smokeless tobacco. She reports that  she drinks about 1.2 ounces of alcohol per week. She reports that she does not use illicit drugs.   Family History:  The patient's family history includes Heart attack in her mother; Other in her brother and father.   ROS:  Please see the history of present illness.      All other systems reviewed and negative.   PHYSICAL EXAM: VS:  BP 120/68  Pulse 66  Ht 5\' 6"  (1.676 m)  Wt 148 lb (67.132 kg)  BMI 23.90 kg/m2 Well nourished, well developed, in no acute distress HEENT: normal Neck: no JVD Cardiac:  normal S1, S2; RRR; no murmur Lungs:  clear to auscultation bilaterally, no wheezing, rhonchi or rales Abd: soft, nontender, no hepatomegaly Ext: no edemaright wrist without hematoma or mass  Skin: warm and dry Neuro:  CNs 2-12 intact, no focal abnormalities noted  EKG:   NSR, HR 66, normal axis, nonspecific ST-T wave changes, no change from prior tracings    ASSESSMENT AND PLAN:  1. Non-Obstructive CAD (Cardiac Cath 01/2014):  No angina.  She is not on ASA as she is on Xarelto.  We discussed the indications for statin Rx and she declines treating her cholesterol with statins. 2. PAF (paroxysmal atrial fibrillation):  Maintaining NSR.  She remains on Xarelto.  3. Essential hypertension:  Controlled.  4. Factor V Leiden mutation:  She remains on Xarelto. 5. Disposition:  F/u with Dr. Fransico Him in 6 mos.    Signed, Versie Starks, MHS 02/19/2014 8:59 AM    [facility] [address], [place], [place]  [address] Phone: [phone]; Fax: [phone]

## 2014-02-19 NOTE — Patient Instructions (Signed)
Your physician wants you to follow-up in: 6 MONTHS WITH DR. TURNER You will receive a reminder letter in the mail two months in advance. If you don't receive a letter, please call our office to schedule the follow-up appointment.   Your physician recommends that you continue on your current medications as directed. Please refer to the Current Medication list given to you today.  

## 2014-02-26 ENCOUNTER — Other Ambulatory Visit: Payer: Self-pay

## 2014-02-26 MED ORDER — RIVAROXABAN 20 MG PO TABS: 20.0000 mg | ORAL_TABLET | Freq: Every day | ORAL | Status: DC

## 2014-03-07 DIAGNOSIS — M1710 Unilateral primary osteoarthritis, unspecified knee: Secondary | ICD-10-CM | POA: Diagnosis not present

## 2014-03-12 DIAGNOSIS — N952 Postmenopausal atrophic vaginitis: Secondary | ICD-10-CM | POA: Diagnosis not present

## 2014-03-15 DIAGNOSIS — M1710 Unilateral primary osteoarthritis, unspecified knee: Secondary | ICD-10-CM | POA: Diagnosis not present

## 2014-03-21 ENCOUNTER — Other Ambulatory Visit: Payer: Self-pay | Admitting: Obstetrics & Gynecology

## 2014-03-21 DIAGNOSIS — E2839 Other primary ovarian failure: Secondary | ICD-10-CM

## 2014-03-22 DIAGNOSIS — M1710 Unilateral primary osteoarthritis, unspecified knee: Secondary | ICD-10-CM | POA: Diagnosis not present

## 2014-03-27 ENCOUNTER — Ambulatory Visit
Admission: RE | Admit: 2014-03-27 | Discharge: 2014-03-27 | Disposition: A | Discharge status: Home / Self Care | Payer: Medicare Other | Source: Ambulatory Visit | Attending: Obstetrics & Gynecology | Admitting: Obstetrics & Gynecology

## 2014-03-27 DIAGNOSIS — M949 Disorder of cartilage, unspecified: Secondary | ICD-10-CM | POA: Diagnosis not present

## 2014-03-27 DIAGNOSIS — M899 Disorder of bone, unspecified: Secondary | ICD-10-CM | POA: Diagnosis not present

## 2014-03-27 DIAGNOSIS — E2839 Other primary ovarian failure: Secondary | ICD-10-CM

## 2014-04-03 ENCOUNTER — Encounter: Payer: Self-pay | Admitting: Cardiology

## 2014-04-10 DIAGNOSIS — M25559 Pain in unspecified hip: Secondary | ICD-10-CM | POA: Diagnosis not present

## 2014-05-09 ENCOUNTER — Other Ambulatory Visit: Payer: Medicare Other

## 2014-06-19 ENCOUNTER — Other Ambulatory Visit: Payer: Medicare Other

## 2014-06-25 ENCOUNTER — Ambulatory Visit: Payer: Medicare Other | Admitting: Cardiology

## 2014-06-25 DIAGNOSIS — M25551 Pain in right hip: Secondary | ICD-10-CM | POA: Diagnosis not present

## 2014-06-25 DIAGNOSIS — M25561 Pain in right knee: Secondary | ICD-10-CM | POA: Diagnosis not present

## 2014-06-25 DIAGNOSIS — M1711 Unilateral primary osteoarthritis, right knee: Secondary | ICD-10-CM | POA: Diagnosis not present

## 2014-06-26 DIAGNOSIS — F331 Major depressive disorder, recurrent, moderate: Secondary | ICD-10-CM | POA: Diagnosis not present

## 2014-06-29 DIAGNOSIS — M25551 Pain in right hip: Secondary | ICD-10-CM | POA: Diagnosis not present

## 2014-07-01 ENCOUNTER — Other Ambulatory Visit: Payer: Self-pay

## 2014-07-01 MED ORDER — AMLODIPINE BESYLATE 2.5 MG PO TABS: 2.5000 mg | ORAL_TABLET | Freq: Every day | ORAL | Status: DC

## 2014-07-03 DIAGNOSIS — M25551 Pain in right hip: Secondary | ICD-10-CM | POA: Diagnosis not present

## 2014-07-09 ENCOUNTER — Other Ambulatory Visit (INDEPENDENT_AMBULATORY_CARE_PROVIDER_SITE_OTHER): Payer: Medicare Other | Admitting: *Deleted

## 2014-07-09 DIAGNOSIS — I10 Essential (primary) hypertension: Secondary | ICD-10-CM

## 2014-07-09 DIAGNOSIS — M25551 Pain in right hip: Secondary | ICD-10-CM | POA: Diagnosis not present

## 2014-07-09 LAB — CREATININE, SERUM: CREATININE: 0.9 mg/dL (ref 0.4–1.2)

## 2014-07-11 ENCOUNTER — Other Ambulatory Visit: Payer: Self-pay

## 2014-07-11 ENCOUNTER — Encounter: Payer: Self-pay | Admitting: Cardiology

## 2014-07-11 ENCOUNTER — Ambulatory Visit (INDEPENDENT_AMBULATORY_CARE_PROVIDER_SITE_OTHER): Payer: Medicare Other | Admitting: Cardiology

## 2014-07-11 VITALS — BP 124/62 | HR 75 | Ht 66.0 in | Wt 143.0 lb

## 2014-07-11 DIAGNOSIS — R9431 Abnormal electrocardiogram [ECG] [EKG]: Secondary | ICD-10-CM

## 2014-07-11 DIAGNOSIS — R002 Palpitations: Secondary | ICD-10-CM | POA: Diagnosis not present

## 2014-07-11 DIAGNOSIS — I480 Paroxysmal atrial fibrillation: Secondary | ICD-10-CM

## 2014-07-11 DIAGNOSIS — I10 Essential (primary) hypertension: Secondary | ICD-10-CM

## 2014-07-11 DIAGNOSIS — I200 Unstable angina: Secondary | ICD-10-CM

## 2014-07-11 MED ORDER — METOPROLOL TARTRATE 25 MG PO TABS: ORAL_TABLET | ORAL | Status: DC

## 2014-07-11 NOTE — Patient Instructions (Addendum)
Your physician has recommended that you wear an event monitor. Event monitors are medical devices that record the heart's electrical activity. Doctors most often [place] these monitors to diagnose arrhythmias. Arrhythmias are problems with the speed or rhythm of the heartbeat. The monitor is a small, portable device. You can wear one while you do your normal daily activities. This is usually used to diagnose what is causing palpitations/syncope (passing out).  Your physician wants you to follow-up in: 6 months with Dr. Radford Pax. You will receive a reminder letter in the mail two months in advance. If you don't receive a letter, please call our office to schedule the follow-up appointment.

## 2014-07-11 NOTE — Progress Notes (Signed)
[place], [address] [place], [place]  [address] Phone: [phone] Fax:  [phone]  Date:  07/11/2014   ID:  Jamie Coffey, DOB May 15, 1941, MRN [id number]  PCP:  [name], MD  Cardiologist:  Fransico Him, MD    History of Present Illness: Jamie Coffey is a 73 y.o. female with a history of PAF, HTN, MVP and diastolic dysfunction who presents today for followup. She has been under a lot of family stress and has lost about 8 pounds.  She has been in and out of atrial fibrillation. She says that about 3 times weekly she will go into afib but if she relaxes it will go back into NSR.  She denies any chest pain, SOB, DOE, LE edema, dizziness, palpitations or syncope.   Wt Readings from Last 3 Encounters:  07/11/14 143 lb (64.864 kg)  02/19/14 148 lb (67.132 kg)  01/19/14 143 lb 12.8 oz (65.227 kg)     Past Medical History  Diagnosis Date  . PAF (paroxysmal atrial fibrillation)     a. 05/2011 Echo: EF 70%, triv MR, mild AI, mod TR, Gr 1 DD.  . Factor 5 Leiden mutation, heterozygous     a. chronic xarelto.  . Chest pain, unspecified     a. 12/2013 MV: EF 65%, no ischemia/infarct.  . Mitral valve prolapse   . Dysphagia   . GERD (gastroesophageal reflux disease)   . History of DVT & PE ~ 2000    S/P RLE knee scope  . Depression   . Hypertension   . Breast cancer 2010    "left", s/p XRT  . Hypothyroidism   . H/O hiatal hernia   . Arthritis     "joints" (01/18/2014)  . Sleep deprivation     "for years and years" (01/18/2014)    Current Outpatient Prescriptions  Medication Sig Dispense Refill  . amLODipine (NORVASC) 2.5 MG tablet Take 1 tablet (2.5 mg total) by mouth daily. 30 tablet 0  . celecoxib (CELEBREX) 200 MG capsule Take 200 mg by mouth daily. 200mg  in the morning Monday through Friday. Does not take on the weekend    . desvenlafaxine (PRISTIQ) 50 MG 24 hr tablet Take 50 mg by mouth daily.    Marland Kitchen levothyroxine (SYNTHROID, LEVOTHROID) 75 MCG tablet Take  75 mcg by mouth daily before breakfast.    . metoprolol tartrate (LOPRESSOR) 25 MG tablet Take 12.5-25 mg by mouth daily as needed for palpitations lasting longer than 30 minutes 10 tablet 0  . rivaroxaban (XARELTO) 20 MG TABS tablet Take 1 tablet (20 mg total) by mouth daily with supper. 5 tablet 0  . zolpidem (AMBIEN) 10 MG tablet Take 10 mg by mouth as needed.      No current facility-administered medications for this visit.    Allergies:   No Known Allergies  Social History:  The patient  reports that she has never smoked. She has never used smokeless tobacco. She reports that she drinks about 1.2 oz of alcohol per week. She reports that she does not use illicit drugs.   Family History:  The patient's family history includes Heart attack in her mother; Other in her brother and father.   ROS:  Please see the history of present illness.      All other systems reviewed and negative.   PHYSICAL EXAM: VS:  BP 124/62 mmHg  Pulse 75  Ht 5\' 6"  (1.676 m)  Wt 143 lb (64.864 kg)  BMI 23.09 kg/m2 Well  nourished, well developed, in no acute distress HEENT: normal Neck: no JVD Cardiac:  normal S1, S2; RRR; no murmur Lungs:  clear to auscultation bilaterally, no wheezing, rhonchi or rales Abd: soft, nontender, no hepatomegaly Ext: no edema Skin: warm and dry Neuro:  CNs 2-12 intact, no focal abnormalities noted  EKG:  NSR with PAC's and nonspecific ST abnormality     ASSESSMENT AND PLAN:  1. PAF maintaining NSR but she is having a lot of palpitations - ? Whether this is due to PAF or PVC's -continue Xarelto/metoprolol - I will get an event monitor to assess  2.  HTN well controlled - continue amlodipine/metoprolol 3. Abnormal EKG with ST/T wave abnormality in the inferolateral leads with no ischemia on nuclear stress test  Followup with me in 6 months   Signed, Fransico Him, MD [facility] 07/11/2014 8:41 AM

## 2014-07-12 DIAGNOSIS — M25551 Pain in right hip: Secondary | ICD-10-CM | POA: Diagnosis not present

## 2014-07-18 DIAGNOSIS — M25551 Pain in right hip: Secondary | ICD-10-CM | POA: Diagnosis not present

## 2014-07-19 ENCOUNTER — Encounter (HOSPITAL_COMMUNITY): Payer: Self-pay | Admitting: Cardiology

## 2014-07-19 ENCOUNTER — Encounter: Payer: Self-pay | Admitting: *Deleted

## 2014-07-19 ENCOUNTER — Encounter (INDEPENDENT_AMBULATORY_CARE_PROVIDER_SITE_OTHER): Payer: Medicare Other

## 2014-07-19 DIAGNOSIS — R002 Palpitations: Secondary | ICD-10-CM

## 2014-07-19 NOTE — Progress Notes (Signed)
Patient ID: Jamie Coffey, female   DOB: [birth year], 73 y.o.   MRN: [id number] [employer] 30 day cardiac event monitor applied to patient.

## 2014-07-20 DIAGNOSIS — F331 Major depressive disorder, recurrent, moderate: Secondary | ICD-10-CM | POA: Diagnosis not present

## 2014-07-23 DIAGNOSIS — R002 Palpitations: Secondary | ICD-10-CM | POA: Diagnosis not present

## 2014-07-24 DIAGNOSIS — M25551 Pain in right hip: Secondary | ICD-10-CM | POA: Diagnosis not present

## 2014-07-27 DIAGNOSIS — M25561 Pain in right knee: Secondary | ICD-10-CM | POA: Diagnosis not present

## 2014-07-27 DIAGNOSIS — M1711 Unilateral primary osteoarthritis, right knee: Secondary | ICD-10-CM | POA: Diagnosis not present

## 2014-07-29 ENCOUNTER — Other Ambulatory Visit: Payer: Self-pay | Admitting: Cardiology

## 2014-07-30 DIAGNOSIS — M25551 Pain in right hip: Secondary | ICD-10-CM | POA: Diagnosis not present

## 2014-08-10 ENCOUNTER — Other Ambulatory Visit: Payer: Self-pay | Admitting: Cardiology

## 2014-08-14 DIAGNOSIS — Z23 Encounter for immunization: Secondary | ICD-10-CM | POA: Diagnosis not present

## 2014-08-16 DIAGNOSIS — F331 Major depressive disorder, recurrent, moderate: Secondary | ICD-10-CM | POA: Diagnosis not present

## 2014-08-20 ENCOUNTER — Telehealth: Payer: Self-pay | Admitting: Cardiology

## 2014-08-20 DIAGNOSIS — I480 Paroxysmal atrial fibrillation: Secondary | ICD-10-CM

## 2014-08-20 NOTE — Telephone Encounter (Signed)
Please let patient know that heart monitor showed NSR with frequent PAC's and nonsustained atrial tachycardia up to 7 beats but no PAF.  Please have her take Lopressor 12.5mg  BID everyday instead of PRN and call and let us know how palptiations are.  Also have her come in for a BMET and TSH

## 2014-08-21 ENCOUNTER — Other Ambulatory Visit: Payer: Self-pay | Admitting: *Deleted

## 2014-08-21 MED ORDER — WARFARIN SODIUM 5 MG PO TABS: 5.0000 mg | ORAL_TABLET | Freq: Every day | ORAL | Status: DC

## 2014-08-21 NOTE — Telephone Encounter (Signed)
Patient stated that at her last ov she had discussed switching from xarelto to coumadin with Dr Radford Pax. She would like an rx for [facility]in [facility]. Thanks, MI

## 2014-08-21 NOTE — Telephone Encounter (Signed)
Pt will switch to warfarin, start at 5 mg daily, set INR appt for approx 5 days after start.  Pt to overlap last 2-3 doses of Xarelto with warfarin

## 2014-08-22 MED ORDER — METOPROLOL TARTRATE 25 MG PO TABS: 12.5000 mg | ORAL_TABLET | Freq: Two times a day (BID) | ORAL | Status: DC

## 2014-08-22 NOTE — Telephone Encounter (Signed)
Patient informed of monitor results and verbal understanding expressed.  Instructed patient to take Lopressor 12.5 mg BID daily instead of PRN. She is to call us next week to F/U. BMET and TSH scheduled for Monday, Jan 18. Patient agrees with treatment plan.

## 2014-08-22 NOTE — Addendum Note (Signed)
Addended by: Harland German A on: 08/22/2014 01:06 PM   Modules accepted: Orders

## 2014-08-24 DIAGNOSIS — L299 Pruritus, unspecified: Secondary | ICD-10-CM | POA: Diagnosis not present

## 2014-08-24 DIAGNOSIS — F331 Major depressive disorder, recurrent, moderate: Secondary | ICD-10-CM | POA: Diagnosis not present

## 2014-08-27 ENCOUNTER — Other Ambulatory Visit (INDEPENDENT_AMBULATORY_CARE_PROVIDER_SITE_OTHER): Payer: Medicare Other | Admitting: *Deleted

## 2014-08-27 DIAGNOSIS — I480 Paroxysmal atrial fibrillation: Secondary | ICD-10-CM

## 2014-08-27 LAB — BASIC METABOLIC PANEL
BUN: 21 mg/dL (ref 6–23)
CO2: 31 meq/L (ref 19–32)
Calcium: 9.3 mg/dL (ref 8.4–10.5)
Chloride: 103 mEq/L (ref 96–112)
Creatinine, Ser: 0.76 mg/dL (ref 0.40–1.20)
GFR: 79.48 mL/min (ref >60.00)
GLUCOSE: 127 mg/dL — AB (ref 70–99)
POTASSIUM: 3.8 meq/L (ref 3.5–5.1)
SODIUM: 139 meq/L (ref 135–145)

## 2014-08-27 LAB — TSH: TSH: 1.27 u[IU]/mL (ref 0.35–4.50)

## 2014-08-30 ENCOUNTER — Telehealth: Payer: Self-pay | Admitting: *Deleted

## 2014-08-30 NOTE — Telephone Encounter (Signed)
Spoke with pt and she states has only been on Xarelto and coumadin for 2 days and today will be the 3rd day so pt appt changed to Monday January 25th for INR check as she is changing from Xarelto to coumadin and pt states understanding

## 2014-09-04 ENCOUNTER — Ambulatory Visit (INDEPENDENT_AMBULATORY_CARE_PROVIDER_SITE_OTHER): Payer: Medicare Other | Admitting: *Deleted

## 2014-09-04 DIAGNOSIS — Z86711 Personal history of pulmonary embolism: Secondary | ICD-10-CM | POA: Diagnosis not present

## 2014-09-04 DIAGNOSIS — Z5181 Encounter for therapeutic drug level monitoring: Secondary | ICD-10-CM | POA: Diagnosis not present

## 2014-09-04 DIAGNOSIS — I480 Paroxysmal atrial fibrillation: Secondary | ICD-10-CM | POA: Diagnosis not present

## 2014-09-04 DIAGNOSIS — D6851 Activated protein C resistance: Secondary | ICD-10-CM

## 2014-09-04 LAB — POCT INR: INR: 4

## 2014-09-04 NOTE — Patient Instructions (Signed)

## 2014-09-11 ENCOUNTER — Ambulatory Visit (INDEPENDENT_AMBULATORY_CARE_PROVIDER_SITE_OTHER): Payer: Medicare Other | Admitting: *Deleted

## 2014-09-11 DIAGNOSIS — Z86711 Personal history of pulmonary embolism: Secondary | ICD-10-CM | POA: Diagnosis not present

## 2014-09-11 DIAGNOSIS — Z5181 Encounter for therapeutic drug level monitoring: Secondary | ICD-10-CM | POA: Diagnosis not present

## 2014-09-11 DIAGNOSIS — I480 Paroxysmal atrial fibrillation: Secondary | ICD-10-CM | POA: Diagnosis not present

## 2014-09-11 DIAGNOSIS — D6851 Activated protein C resistance: Secondary | ICD-10-CM | POA: Diagnosis not present

## 2014-09-11 LAB — POCT INR: INR: 1.4

## 2014-09-18 ENCOUNTER — Ambulatory Visit (INDEPENDENT_AMBULATORY_CARE_PROVIDER_SITE_OTHER): Payer: Medicare Other | Admitting: *Deleted

## 2014-09-18 DIAGNOSIS — Z5181 Encounter for therapeutic drug level monitoring: Secondary | ICD-10-CM

## 2014-09-18 DIAGNOSIS — I480 Paroxysmal atrial fibrillation: Secondary | ICD-10-CM | POA: Diagnosis not present

## 2014-09-18 DIAGNOSIS — D6851 Activated protein C resistance: Secondary | ICD-10-CM

## 2014-09-18 DIAGNOSIS — Z86711 Personal history of pulmonary embolism: Secondary | ICD-10-CM

## 2014-09-18 LAB — POCT INR: INR: 1.5

## 2014-09-21 ENCOUNTER — Ambulatory Visit (INDEPENDENT_AMBULATORY_CARE_PROVIDER_SITE_OTHER): Payer: Medicare Other | Admitting: *Deleted

## 2014-09-21 DIAGNOSIS — Z5181 Encounter for therapeutic drug level monitoring: Secondary | ICD-10-CM

## 2014-09-21 DIAGNOSIS — I480 Paroxysmal atrial fibrillation: Secondary | ICD-10-CM | POA: Diagnosis not present

## 2014-09-21 DIAGNOSIS — D6851 Activated protein C resistance: Secondary | ICD-10-CM | POA: Diagnosis not present

## 2014-09-21 DIAGNOSIS — Z86711 Personal history of pulmonary embolism: Secondary | ICD-10-CM | POA: Diagnosis not present

## 2014-09-21 LAB — POCT INR: INR: 2.9

## 2014-10-08 ENCOUNTER — Ambulatory Visit (INDEPENDENT_AMBULATORY_CARE_PROVIDER_SITE_OTHER): Payer: Medicare Other | Admitting: *Deleted

## 2014-10-08 DIAGNOSIS — D6851 Activated protein C resistance: Secondary | ICD-10-CM | POA: Diagnosis not present

## 2014-10-08 DIAGNOSIS — Z86711 Personal history of pulmonary embolism: Secondary | ICD-10-CM

## 2014-10-08 DIAGNOSIS — I480 Paroxysmal atrial fibrillation: Secondary | ICD-10-CM | POA: Diagnosis not present

## 2014-10-08 DIAGNOSIS — Z5181 Encounter for therapeutic drug level monitoring: Secondary | ICD-10-CM

## 2014-10-08 LAB — POCT INR: INR: 5.3

## 2014-10-15 ENCOUNTER — Ambulatory Visit (INDEPENDENT_AMBULATORY_CARE_PROVIDER_SITE_OTHER): Payer: Medicare Other | Admitting: Pharmacist

## 2014-10-15 DIAGNOSIS — D6851 Activated protein C resistance: Secondary | ICD-10-CM | POA: Diagnosis not present

## 2014-10-15 DIAGNOSIS — I480 Paroxysmal atrial fibrillation: Secondary | ICD-10-CM

## 2014-10-15 DIAGNOSIS — Z5181 Encounter for therapeutic drug level monitoring: Secondary | ICD-10-CM

## 2014-10-15 DIAGNOSIS — Z86711 Personal history of pulmonary embolism: Secondary | ICD-10-CM

## 2014-10-15 LAB — POCT INR: INR: 2

## 2014-10-18 ENCOUNTER — Encounter: Payer: Self-pay | Admitting: Cardiology

## 2014-10-19 ENCOUNTER — Other Ambulatory Visit: Payer: Self-pay | Admitting: Internal Medicine

## 2014-10-19 DIAGNOSIS — E039 Hypothyroidism, unspecified: Secondary | ICD-10-CM | POA: Diagnosis not present

## 2014-10-19 DIAGNOSIS — R42 Dizziness and giddiness: Secondary | ICD-10-CM | POA: Diagnosis not present

## 2014-10-19 DIAGNOSIS — Z853 Personal history of malignant neoplasm of breast: Secondary | ICD-10-CM

## 2014-10-19 DIAGNOSIS — R109 Unspecified abdominal pain: Secondary | ICD-10-CM | POA: Diagnosis not present

## 2014-10-19 DIAGNOSIS — Z6823 Body mass index (BMI) 23.0-23.9, adult: Secondary | ICD-10-CM | POA: Diagnosis not present

## 2014-10-19 DIAGNOSIS — I10 Essential (primary) hypertension: Secondary | ICD-10-CM | POA: Diagnosis not present

## 2014-10-19 DIAGNOSIS — L659 Nonscarring hair loss, unspecified: Secondary | ICD-10-CM | POA: Diagnosis not present

## 2014-10-25 ENCOUNTER — Ambulatory Visit (INDEPENDENT_AMBULATORY_CARE_PROVIDER_SITE_OTHER): Payer: Medicare Other | Admitting: *Deleted

## 2014-10-25 DIAGNOSIS — Z5181 Encounter for therapeutic drug level monitoring: Secondary | ICD-10-CM | POA: Diagnosis not present

## 2014-10-25 DIAGNOSIS — I480 Paroxysmal atrial fibrillation: Secondary | ICD-10-CM | POA: Diagnosis not present

## 2014-10-25 DIAGNOSIS — Z86711 Personal history of pulmonary embolism: Secondary | ICD-10-CM

## 2014-10-25 DIAGNOSIS — D6851 Activated protein C resistance: Secondary | ICD-10-CM | POA: Diagnosis not present

## 2014-10-25 LAB — POCT INR: INR: 3.4

## 2014-10-29 ENCOUNTER — Encounter (INDEPENDENT_AMBULATORY_CARE_PROVIDER_SITE_OTHER): Payer: Self-pay

## 2014-10-29 ENCOUNTER — Ambulatory Visit
Admission: RE | Admit: 2014-10-29 | Discharge: 2014-10-29 | Disposition: A | Discharge status: Home / Self Care | Payer: Medicare Other | Source: Ambulatory Visit | Attending: Internal Medicine | Admitting: Internal Medicine

## 2014-10-29 DIAGNOSIS — Z853 Personal history of malignant neoplasm of breast: Secondary | ICD-10-CM

## 2014-11-03 ENCOUNTER — Other Ambulatory Visit: Payer: Self-pay | Admitting: Cardiology

## 2014-11-05 ENCOUNTER — Other Ambulatory Visit: Payer: Self-pay | Admitting: Cardiology

## 2014-11-05 ENCOUNTER — Telehealth: Payer: Self-pay | Admitting: Cardiology

## 2014-11-05 NOTE — Telephone Encounter (Signed)
Left message to call back  

## 2014-11-05 NOTE — Telephone Encounter (Signed)
Patient st she was "staggering" this morning after taking her metoprolol on an empty stomach. She experienced dizziness and tiredness. She looked online and she said she feels a lot of the symptoms that are listed as side effects for the medication. She has experienced a tingly hand, fatigue, and vertigo. It is worse when she pivots or turns. Today the symptoms lasted for 4 hours. She is currently asymptomatic.  She st Dr. Philip Aspen has mentioned before about talking to Dr. Radford Pax about changing beta-blocker.  To Dr. Radford Pax for recommendations.

## 2014-11-05 NOTE — Telephone Encounter (Signed)
New message      Pt c/o medication issue:  1. Name of Medication: metoprolol 2. How are you currently taking this medication (dosage and times per day)? 25mg  ---1/2 in am and 1/2 in pm 3. Are you having a reaction (difficulty breathing--STAT)? no  4. What is your medication issue? Pt is having dizziness, dry eyes, fatigue

## 2014-11-05 NOTE — Telephone Encounter (Signed)
Change metoprolol to Bystolic 2.5mg  daily and take with food.  SYmptoms sound more like vertigo which is not caused by BB

## 2014-11-06 MED ORDER — NEBIVOLOL HCL 2.5 MG PO TABS: 2.5000 mg | ORAL_TABLET | Freq: Every day | ORAL | Status: DC

## 2014-11-06 NOTE — Telephone Encounter (Signed)
Follow up  ° ° ° °Returning call back to nurse  °

## 2014-11-06 NOTE — Telephone Encounter (Signed)
Instructed patient to STOP METOPROLOL and START BYSTOLIC 2.5 mg daily with food. Patient agrees with treatment plan.

## 2014-11-08 ENCOUNTER — Ambulatory Visit (INDEPENDENT_AMBULATORY_CARE_PROVIDER_SITE_OTHER): Payer: Medicare Other | Admitting: Pharmacist

## 2014-11-08 DIAGNOSIS — Z86711 Personal history of pulmonary embolism: Secondary | ICD-10-CM | POA: Diagnosis not present

## 2014-11-08 DIAGNOSIS — I480 Paroxysmal atrial fibrillation: Secondary | ICD-10-CM | POA: Diagnosis not present

## 2014-11-08 DIAGNOSIS — Z5181 Encounter for therapeutic drug level monitoring: Secondary | ICD-10-CM | POA: Diagnosis not present

## 2014-11-08 DIAGNOSIS — D6851 Activated protein C resistance: Secondary | ICD-10-CM

## 2014-11-08 LAB — POCT INR: INR: 4.4

## 2014-11-22 ENCOUNTER — Ambulatory Visit (INDEPENDENT_AMBULATORY_CARE_PROVIDER_SITE_OTHER): Payer: Medicare Other | Admitting: *Deleted

## 2014-11-22 DIAGNOSIS — Z86711 Personal history of pulmonary embolism: Secondary | ICD-10-CM

## 2014-11-22 DIAGNOSIS — I480 Paroxysmal atrial fibrillation: Secondary | ICD-10-CM

## 2014-11-22 DIAGNOSIS — D6851 Activated protein C resistance: Secondary | ICD-10-CM

## 2014-11-22 DIAGNOSIS — Z5181 Encounter for therapeutic drug level monitoring: Secondary | ICD-10-CM | POA: Diagnosis not present

## 2014-11-22 LAB — POCT INR: INR: 2.8

## 2014-12-13 ENCOUNTER — Ambulatory Visit (INDEPENDENT_AMBULATORY_CARE_PROVIDER_SITE_OTHER): Payer: Medicare Other | Admitting: *Deleted

## 2014-12-13 DIAGNOSIS — I480 Paroxysmal atrial fibrillation: Secondary | ICD-10-CM | POA: Diagnosis not present

## 2014-12-13 DIAGNOSIS — D6851 Activated protein C resistance: Secondary | ICD-10-CM | POA: Diagnosis not present

## 2014-12-13 DIAGNOSIS — Z86711 Personal history of pulmonary embolism: Secondary | ICD-10-CM

## 2014-12-13 DIAGNOSIS — Z5181 Encounter for therapeutic drug level monitoring: Secondary | ICD-10-CM

## 2014-12-13 LAB — POCT INR: INR: 2.8

## 2014-12-18 DIAGNOSIS — M25562 Pain in left knee: Secondary | ICD-10-CM | POA: Diagnosis not present

## 2014-12-18 DIAGNOSIS — M1711 Unilateral primary osteoarthritis, right knee: Secondary | ICD-10-CM | POA: Diagnosis not present

## 2014-12-19 ENCOUNTER — Encounter: Payer: Self-pay | Admitting: Cardiology

## 2014-12-31 ENCOUNTER — Encounter: Payer: Self-pay | Admitting: Cardiology

## 2014-12-31 ENCOUNTER — Ambulatory Visit (INDEPENDENT_AMBULATORY_CARE_PROVIDER_SITE_OTHER): Payer: Medicare Other | Admitting: *Deleted

## 2014-12-31 ENCOUNTER — Ambulatory Visit (INDEPENDENT_AMBULATORY_CARE_PROVIDER_SITE_OTHER): Payer: Medicare Other | Admitting: Cardiology

## 2014-12-31 VITALS — BP 132/70 | HR 72 | Ht 66.0 in | Wt 146.2 lb

## 2014-12-31 DIAGNOSIS — I480 Paroxysmal atrial fibrillation: Secondary | ICD-10-CM

## 2014-12-31 DIAGNOSIS — Z5181 Encounter for therapeutic drug level monitoring: Secondary | ICD-10-CM

## 2014-12-31 DIAGNOSIS — Z7901 Long term (current) use of anticoagulants: Secondary | ICD-10-CM

## 2014-12-31 DIAGNOSIS — R0602 Shortness of breath: Secondary | ICD-10-CM | POA: Diagnosis not present

## 2014-12-31 DIAGNOSIS — I10 Essential (primary) hypertension: Secondary | ICD-10-CM

## 2014-12-31 DIAGNOSIS — R9431 Abnormal electrocardiogram [ECG] [EKG]: Secondary | ICD-10-CM | POA: Diagnosis not present

## 2014-12-31 DIAGNOSIS — Z86711 Personal history of pulmonary embolism: Secondary | ICD-10-CM

## 2014-12-31 LAB — BASIC METABOLIC PANEL
BUN: 17 mg/dL (ref 6–23)
CALCIUM: 9.4 mg/dL (ref 8.4–10.5)
CO2: 30 mEq/L (ref 19–32)
CREATININE: 0.79 mg/dL (ref 0.40–1.20)
Chloride: 103 mEq/L (ref 96–112)
GFR: 75.94 mL/min (ref >60.00)
Glucose, Bld: 82 mg/dL (ref 70–99)
Potassium: 3.9 mEq/L (ref 3.5–5.1)
Sodium: 139 mEq/L (ref 135–145)

## 2014-12-31 LAB — CBC WITH DIFFERENTIAL/PLATELET
BASOS ABS: 0 10*3/uL (ref 0.0–0.1)
Basophils Relative: 0.4 % (ref 0.0–3.0)
EOS ABS: 0.1 10*3/uL (ref 0.0–0.7)
EOS PCT: 1 % (ref 0.0–5.0)
HEMATOCRIT: 40.2 % (ref 36.0–46.0)
Hemoglobin: 13.7 g/dL (ref 12.0–15.0)
Lymphocytes Relative: 28.4 % (ref 12.0–46.0)
Lymphs Abs: 1.7 10*3/uL (ref 0.7–4.0)
MCHC: 34 g/dL (ref 30.0–36.0)
MCV: 88.9 fl (ref 78.0–100.0)
MONO ABS: 0.5 10*3/uL (ref 0.1–1.0)
MONOS PCT: 8.7 % (ref 3.0–12.0)
NEUTROS ABS: 3.8 10*3/uL (ref 1.4–7.7)
Neutrophils Relative %: 61.5 % (ref 43.0–77.0)
Platelets: 239 10*3/uL (ref 150.0–400.0)
RBC: 4.52 Mil/uL (ref 3.87–5.11)
RDW: 12.9 % (ref 11.5–15.5)
WBC: 6.2 10*3/uL (ref 4.0–10.5)

## 2014-12-31 LAB — POCT INR: INR: 2.7

## 2014-12-31 NOTE — Progress Notes (Signed)
Cardiology Office Note   Date:  12/31/2014   ID:  Jamie Coffey, DOB 04-27-1941, MRN [id number]  PCP:  [name], MD  Cardiologist:   Sueanne Margarita, MD   Chief Complaint  Patient presents with  . Follow-up    PAF      History of Present Illness: Jamie Coffey is a 74 y.o. female with a history of PAF, HTN, MVP and diastolic dysfunction who presents today for followup. She denies any chest pain, LE edema, dizziness (except for when she turns fast), palpitations or syncope.  She has noticed some DOE on occasion when walking but it does not occur daily.  She notices it more if she has not slept well the night before.  She has had some bruising on her legs which is new for her.       Past Medical History  Diagnosis Date  . PAF (paroxysmal atrial fibrillation)     a. 05/2011 Echo: EF 70%, triv MR, mild AI, mod TR, Gr 1 DD.  . Factor 5 Leiden mutation, heterozygous     a. chronic xarelto.  . Chest pain, unspecified     a. 12/2013 MV: EF 65%, no ischemia/infarct.  . Mitral valve prolapse   . Dysphagia   . GERD (gastroesophageal reflux disease)   . History of DVT & PE ~ 2000    S/P RLE knee scope  . Depression   . Hypertension   . Breast cancer 2010    "left", s/p XRT  . Hypothyroidism   . H/O hiatal hernia   . Arthritis     "joints" (01/18/2014)  . Sleep deprivation     "for years and years" (01/18/2014)    Past Surgical History  Procedure Laterality Date  . Tonsillectomy    . Knee arthroscopy Right ~ 2000  . Knee arthroscopy Left ~ 2009  . Abdominal hysterectomy  1983  . Dilation and curettage of uterus  1980's  . Tubal ligation  1980's  . Breast biopsy Right 2010  . Breast lumpectomy Left 2010  . Left heart catheterization with coronary angiogram N/A 01/19/2014    Procedure: LEFT HEART CATHETERIZATION WITH CORONARY ANGIOGRAM;  Surgeon: [name], MD;  Location: [facility] CATH LAB;  Service: Cardiovascular;  Laterality: N/A;     Current Outpatient  Prescriptions  Medication Sig Dispense Refill  . amLODipine (NORVASC) 2.5 MG tablet TAKE 1 TABLET BY MOUTH ONCE DAILY 30 tablet 1  . celecoxib (CELEBREX) 200 MG capsule Take 200 mg by mouth daily. 200mg  in the morning Monday through Friday. Does not take on the weekend    . desvenlafaxine (PRISTIQ) 50 MG 24 hr tablet Take 50 mg by mouth daily.    Marland Kitchen levothyroxine (SYNTHROID, LEVOTHROID) 75 MCG tablet Take 75 mcg by mouth daily before breakfast.    . nebivolol (BYSTOLIC) 2.5 MG tablet Take 1 tablet (2.5 mg total) by mouth daily. 30 tablet 6  . warfarin (COUMADIN) 5 MG tablet Take as directed by Coumadin clinic 30 tablet 3  . zolpidem (AMBIEN) 10 MG tablet Take 10 mg by mouth as needed.      No current facility-administered medications for this visit.    Allergies:   Review of patient's allergies indicates no known allergies.    Social History:  The patient  reports that she has never smoked. She has never used smokeless tobacco. She reports that she drinks about 1.2 oz of alcohol per week. She reports that she does not use illicit  drugs.   Family History:  The patient's family history includes Heart attack in her mother; Other in her brother and father.    ROS:  Please see the history of present illness.   Otherwise, review of systems are positive for none.   All other systems are reviewed and negative.    PHYSICAL EXAM: VS:  BP 132/70 mmHg  Pulse 72  Ht 5\' 6"  (1.676 m)  Wt 146 lb 3.2 oz (66.316 kg)  BMI 23.61 kg/m2  SpO2 97% , BMI Body mass index is 23.61 kg/(m^2). GEN: Well nourished, well developed, in no acute distress HEENT: normal Neck: no JVD, carotid bruits, or masses Cardiac: RRR; no murmurs, rubs, or gallops,no edema  Respiratory:  clear to auscultation bilaterally, normal work of breathing GI: soft, nontender, nondistended, + BS MS: no deformity or atrophy Skin: warm and dry, no rash Neuro:  Strength and sensation are intact Psych: euthymic mood, full affect   EKG:   EKG is not ordered today.    Recent Labs: 01/19/2014: ALT 11; Hemoglobin 13.0; Platelets 214 08/27/2014: BUN 21; Creatinine 0.76; Potassium 3.8; Sodium 139; TSH 1.27    Lipid Panel    Component Value Date/Time   CHOL 198 01/19/2014 0347   TRIG 40 01/19/2014 0347   HDL 105 01/19/2014 0347   CHOLHDL 1.9 01/19/2014 0347   VLDL 8 01/19/2014 0347   LDLCALC 85 01/19/2014 0347      Wt Readings from Last 3 Encounters:  12/31/14 146 lb 3.2 oz (66.316 kg)  07/11/14 143 lb (64.864 kg)  02/19/14 148 lb (67.132 kg)     ASSESSMENT AND PLAN:  1.  PAF maintaining NSR  -continue warfarin/metoprolol - check INR today given her increased bruising 2. HTN well controlled - continue amlodipine/metoprolol   3. Abnormal EKG with nonspecific ST/T wave abnormality in the inferolateral leads with no ischemia on nuclear stress test a year ago   4.  SOB only when she does not get enough sleep.  I will get a 2D echo to make sure LVF is normal.  Her stress test a year ago was normal.      Current medicines are reviewed at length with the patient today.  The patient does not have concerns regarding medicines.  The following changes have been made:  no change  Labs/ tests ordered today include: CBC, BMET and echo   Orders Placed This Encounter  Procedures  . Basic Metabolic Panel (BMET)  . CBC w/Diff  . Echocardiogram     Disposition:   FU with me in 6 months  Signed, Sueanne Margarita, MD  12/31/2014 1:33 PM    [facility] [address], [place], [place]  [address] Phone: [phone]; Fax: [phone]

## 2014-12-31 NOTE — Patient Instructions (Signed)
Medication Instructions:  Your physician recommends that you continue on your current medications as directed. Please refer to the Current Medication list given to you today.   Labwork: BMET, CBC  Testing/Procedures: Your physician has requested that you have an echocardiogram. Echocardiography is a painless test that uses sound waves to create images of your heart. It provides your doctor with information about the size and shape of your heart and how well your heart's chambers and valves are working. This procedure takes approximately one hour. There are no restrictions for this procedure.  Follow-Up: Your physician wants you to follow-up in: 6 months with Dr. Radford Pax. You will receive a reminder letter in the mail two months in advance. If you don't receive a letter, please call our office to schedule the follow-up appointment.   Any Other Special Instructions Will Be Listed Below (If Applicable).

## 2015-01-02 DIAGNOSIS — N390 Urinary tract infection, site not specified: Secondary | ICD-10-CM | POA: Diagnosis not present

## 2015-01-02 DIAGNOSIS — E039 Hypothyroidism, unspecified: Secondary | ICD-10-CM | POA: Diagnosis not present

## 2015-01-02 DIAGNOSIS — I10 Essential (primary) hypertension: Secondary | ICD-10-CM | POA: Diagnosis not present

## 2015-01-02 DIAGNOSIS — R8299 Other abnormal findings in urine: Secondary | ICD-10-CM | POA: Diagnosis not present

## 2015-01-10 ENCOUNTER — Other Ambulatory Visit: Payer: Self-pay

## 2015-01-10 ENCOUNTER — Ambulatory Visit (HOSPITAL_COMMUNITY): Payer: Medicare Other | Attending: Cardiovascular Disease

## 2015-01-10 DIAGNOSIS — I351 Nonrheumatic aortic (valve) insufficiency: Secondary | ICD-10-CM | POA: Insufficient documentation

## 2015-01-10 DIAGNOSIS — R0602 Shortness of breath: Secondary | ICD-10-CM

## 2015-01-10 HISTORY — PX: TRANSTHORACIC ECHOCARDIOGRAM: SHX275

## 2015-01-11 ENCOUNTER — Telehealth: Payer: Self-pay | Admitting: Cardiology

## 2015-01-11 DIAGNOSIS — C50912 Malignant neoplasm of unspecified site of left female breast: Secondary | ICD-10-CM | POA: Diagnosis not present

## 2015-01-11 DIAGNOSIS — Z1389 Encounter for screening for other disorder: Secondary | ICD-10-CM | POA: Diagnosis not present

## 2015-01-11 DIAGNOSIS — I480 Paroxysmal atrial fibrillation: Secondary | ICD-10-CM | POA: Diagnosis not present

## 2015-01-11 DIAGNOSIS — G2581 Restless legs syndrome: Secondary | ICD-10-CM | POA: Diagnosis not present

## 2015-01-11 DIAGNOSIS — L659 Nonscarring hair loss, unspecified: Secondary | ICD-10-CM | POA: Diagnosis not present

## 2015-01-11 DIAGNOSIS — I272 Pulmonary hypertension, unspecified: Secondary | ICD-10-CM

## 2015-01-11 DIAGNOSIS — F329 Major depressive disorder, single episode, unspecified: Secondary | ICD-10-CM | POA: Diagnosis not present

## 2015-01-11 DIAGNOSIS — Z0000 Encounter for general adult medical examination without abnormal findings: Secondary | ICD-10-CM | POA: Diagnosis not present

## 2015-01-11 DIAGNOSIS — I10 Essential (primary) hypertension: Secondary | ICD-10-CM | POA: Diagnosis not present

## 2015-01-11 DIAGNOSIS — E785 Hyperlipidemia, unspecified: Secondary | ICD-10-CM | POA: Diagnosis not present

## 2015-01-11 NOTE — Telephone Encounter (Signed)
-----   Message from Sueanne Margarita, MD sent at 01/10/2015 10:38 AM EDT ----- Please let patient know that echo showed normal LVF with moderate AI and very mild pulmonary HTN  - repeat echo in 1 year

## 2015-01-11 NOTE — Telephone Encounter (Signed)
Informed patient of results and verbal understanding expressed.  Repeat ECHO ordered to be scheduled in 1 year.

## 2015-01-11 NOTE — Telephone Encounter (Signed)
Pt returned call for results ( Follow Up)

## 2015-01-15 ENCOUNTER — Ambulatory Visit: Payer: Medicare Other | Admitting: Cardiology

## 2015-01-28 ENCOUNTER — Ambulatory Visit (INDEPENDENT_AMBULATORY_CARE_PROVIDER_SITE_OTHER): Payer: Medicare Other | Admitting: *Deleted

## 2015-01-28 DIAGNOSIS — Z86711 Personal history of pulmonary embolism: Secondary | ICD-10-CM | POA: Diagnosis not present

## 2015-01-28 DIAGNOSIS — I480 Paroxysmal atrial fibrillation: Secondary | ICD-10-CM

## 2015-01-28 DIAGNOSIS — D6851 Activated protein C resistance: Secondary | ICD-10-CM | POA: Diagnosis not present

## 2015-01-28 DIAGNOSIS — Z5181 Encounter for therapeutic drug level monitoring: Secondary | ICD-10-CM | POA: Diagnosis not present

## 2015-01-28 LAB — POCT INR: INR: 2.7

## 2015-02-07 DIAGNOSIS — L218 Other seborrheic dermatitis: Secondary | ICD-10-CM | POA: Diagnosis not present

## 2015-02-07 DIAGNOSIS — L821 Other seborrheic keratosis: Secondary | ICD-10-CM | POA: Diagnosis not present

## 2015-02-07 DIAGNOSIS — L738 Other specified follicular disorders: Secondary | ICD-10-CM | POA: Diagnosis not present

## 2015-02-13 DIAGNOSIS — M170 Bilateral primary osteoarthritis of knee: Secondary | ICD-10-CM | POA: Diagnosis not present

## 2015-03-11 ENCOUNTER — Ambulatory Visit (INDEPENDENT_AMBULATORY_CARE_PROVIDER_SITE_OTHER): Payer: Medicare Other | Admitting: Pharmacist

## 2015-03-11 DIAGNOSIS — Z5181 Encounter for therapeutic drug level monitoring: Secondary | ICD-10-CM

## 2015-03-11 DIAGNOSIS — Z86711 Personal history of pulmonary embolism: Secondary | ICD-10-CM | POA: Diagnosis not present

## 2015-03-11 DIAGNOSIS — I480 Paroxysmal atrial fibrillation: Secondary | ICD-10-CM

## 2015-03-11 DIAGNOSIS — D6851 Activated protein C resistance: Secondary | ICD-10-CM

## 2015-03-11 LAB — POCT INR: INR: 2.4

## 2015-03-13 NOTE — H&P (Signed)
TOTAL KNEE ADMISSION H&P  Patient is being admitted for right total knee arthroplasty.  Subjective:  Chief Complaint:    Right knee primary OA / pain.  HPI: Jamie Coffey, 74 y.o. female, has a history of pain and functional disability in the right knee due to arthritis and has failed non-surgical conservative treatments for greater than 12 weeks to includeNSAID's and/or analgesics, corticosteriod injections, viscosupplementation injections and activity modification.  Onset of symptoms was gradual, starting 3+ years ago with gradually worsening course since that time. The patient noted prior procedures on the knee to include  arthroscopy and menisectomy on the right knee(s).  Patient currently rates pain in the right knee(s) at 9 out of 10 with activity. Patient has worsening of pain with activity and weight bearing, pain that interferes with activities of daily living, pain with passive range of motion, crepitus and joint swelling.  Patient has evidence of periarticular osteophytes and joint space narrowing by imaging studies.  There is no active infection.  Risks, benefits and expectations were discussed with the patient.  Risks including but not limited to the risk of anesthesia, blood clots, nerve damage, blood vessel damage, failure of the prosthesis, infection and up to and including death.  Patient understand the risks, benefits and expectations and wishes to proceed with surgery.   PCP: [name], MD  D/C Plans:      Home with HHPT  Post-op Meds:       No Rx given  Tranexamic Acid:      To be given - topically  (previous PE)  Decadron:      Is to be given  FYI:     Coumadin with branching Lovenox  Norco post-op  Factor IV leiden    Patient Active Problem List   Diagnosis Date Noted  . SOB (shortness of breath) 12/31/2014  . Encounter for therapeutic drug monitoring 09/04/2014  . History of pulmonary embolism 01/19/2014  . Chronic anticoagulation- Xarelto 01/19/2014  .  Non-cardiac chest pain 01/19/2014  . Abnormal EKG 11/22/2013  . PAF (paroxysmal atrial fibrillation)   . Bradycardia 11/29/2012  . Factor 5 Leiden mutation, heterozygous   . Hypertension    Past Medical History  Diagnosis Date  . PAF (paroxysmal atrial fibrillation)     a. 05/2011 Echo: EF 70%, triv MR, mild AI, mod TR, Gr 1 DD.  . Factor 5 Leiden mutation, heterozygous     a. chronic xarelto.  . Chest pain, unspecified     a. 12/2013 MV: EF 65%, no ischemia/infarct.  . Mitral valve prolapse   . Dysphagia   . GERD (gastroesophageal reflux disease)   . History of DVT & PE ~ 2000    S/P RLE knee scope  . Depression   . Hypertension   . Breast cancer 2010    "left", s/p XRT  . Hypothyroidism   . H/O hiatal hernia   . Arthritis     "joints" (01/18/2014)  . Sleep deprivation     "for years and years" (01/18/2014)    Past Surgical History  Procedure Laterality Date  . Tonsillectomy    . Knee arthroscopy Right ~ 2000  . Knee arthroscopy Left ~ 2009  . Abdominal hysterectomy  1983  . Dilation and curettage of uterus  1980's  . Tubal ligation  1980's  . Breast biopsy Right 2010  . Breast lumpectomy Left 2010  . Left heart catheterization with coronary angiogram N/A 01/19/2014    Procedure: LEFT HEART CATHETERIZATION WITH CORONARY  Cyril Loosen;  Surgeon: [name], MD;  Location: [facility] CATH LAB;  Service: Cardiovascular;  Laterality: N/A;    No prescriptions prior to admission   No Known Allergies   History  Substance Use Topics  . Smoking status: Never Smoker   . Smokeless tobacco: Never Used  . Alcohol Use: 1.2 oz/week    2 Glasses of wine per week    Family History  Problem Relation Age of Onset  . Heart attack Mother     "small heart attack" at 74, died @ 55.  . Other Father     died @ 20 following knee surgery  . Other Brother     alive and well.     Review of Systems  Constitutional: Negative.   HENT: Negative.   Eyes: Negative.   Respiratory: Negative.    Cardiovascular: Negative.   Gastrointestinal: Positive for heartburn.  Genitourinary: Negative.   Musculoskeletal: Positive for joint pain.  Skin: Negative.   Neurological: Negative.   Endo/Heme/Allergies: Negative.   Psychiatric/Behavioral: Positive for depression.    Objective:  Physical Exam  Constitutional: She is oriented to person, place, and time. She appears well-developed.  HENT:  Head: Normocephalic.  Eyes: Pupils are equal, round, and reactive to light.  Neck: Neck supple. No JVD present. No tracheal deviation present. No thyromegaly present.  Cardiovascular: Normal rate, normal heart sounds and intact distal pulses.   Respiratory: Effort normal and breath sounds normal. No stridor. No respiratory distress. She has no wheezes.  GI: Soft. There is no tenderness. There is no guarding.  Musculoskeletal:       Right knee: She exhibits decreased range of motion, swelling and bony tenderness. She exhibits no ecchymosis, no deformity, no laceration and no erythema. Tenderness found.  Lymphadenopathy:    She has no cervical adenopathy.  Neurological: She is alert and oriented to person, place, and time.  Skin: Skin is warm and dry.  Psychiatric: She has a normal mood and affect.      Labs:  Estimated body mass index is 23.61 kg/(m^2) as calculated from the following:   Height as of 12/31/14: 5\' 6"  (1.676 m).   Weight as of 12/31/14: 66.316 kg (146 lb 3.2 oz).   Imaging Review Plain radiographs demonstrate severe degenerative joint disease of the right knee(s). The overall alignment is neutral. The bone quality appears to be good for age and reported activity level.  Assessment/Plan:  End stage arthritis, right knee   The patient history, physical examination, clinical judgment of the provider and imaging studies are consistent with end stage degenerative joint disease of the right knee(s) and total knee arthroplasty is deemed medically necessary. The treatment options  including medical management, injection therapy arthroscopy and arthroplasty were discussed at length. The risks and benefits of total knee arthroplasty were presented and reviewed. The risks due to aseptic loosening, infection, stiffness, patella tracking problems, thromboembolic complications and other imponderables were discussed. The patient acknowledged the explanation, agreed to proceed with the plan and consent was signed. Patient is being admitted for inpatient treatment for surgery, pain control, PT, OT, prophylactic antibiotics, VTE prophylaxis, progressive ambulation and ADL's and discharge planning. The patient is planning to be discharged home with home health services.    [place] Maricarmen Braziel   PA-C  03/13/2015, 12:58 PM

## 2015-03-14 ENCOUNTER — Ambulatory Visit (INDEPENDENT_AMBULATORY_CARE_PROVIDER_SITE_OTHER): Payer: Medicare Other | Admitting: *Deleted

## 2015-03-14 DIAGNOSIS — D6851 Activated protein C resistance: Secondary | ICD-10-CM | POA: Diagnosis not present

## 2015-03-14 DIAGNOSIS — I480 Paroxysmal atrial fibrillation: Secondary | ICD-10-CM

## 2015-03-14 DIAGNOSIS — Z5181 Encounter for therapeutic drug level monitoring: Secondary | ICD-10-CM

## 2015-03-14 DIAGNOSIS — Z86711 Personal history of pulmonary embolism: Secondary | ICD-10-CM

## 2015-03-14 LAB — POCT INR: INR: 2.4

## 2015-03-14 MED ORDER — ENOXAPARIN SODIUM 60 MG/0.6ML ~~LOC~~ SOLN: 60.0000 mg | Freq: Two times a day (BID) | SUBCUTANEOUS | Status: DC

## 2015-03-14 NOTE — Patient Instructions (Addendum)
03/19/15 Last dose of Coumadin  03/20/15 No Coumadin or Lovenox   03/21/15 Start taking Lovenox 60mg  at 8am and 8pm into the fatty abdominal tissue, rotate sites with each injection  03/22/15  Lovenox 60mg  at 8am and 8pm into the fatty abdominal tissue, rotate sites with each injection  03/23/15 Lovenox 60mg  at 8am and 8pm into the fatty abdominal tissue, rotate sites with each injection  03/24/15 Lovenox 60mg  at 8am into the fatty abdominal tissue, rotate sites with each injection  03/25/15 Procedure date-NO LOVENOX OR COUMADIN   Resume Lovenox and Coumadin when instructed to do so by Dr. Alvan Dame.  You will resume both Lovenox and Coumadin daily.  Once you restart your coumadin take an extra 1/2 tablet for two doses and continue Lovenox at 8am and 8pm.   We will need to check your INR in 1 week after resuming Coumadin and having Procedure.  [phone]

## 2015-03-18 NOTE — Patient Instructions (Addendum)
YOUR PROCEDURE IS SCHEDULED ON :  03/25/15  REPORT TO [facility] MAIN ENTRANCE FOLLOW SIGNS TO EAST ELEVATOR - GO TO 3rd FLOOR CHECK IN AT 3 EAST NURSES STATION (SHORT STAY) AT: 7:15 AM  CALL THIS NUMBER IF YOU HAVE PROBLEMS THE MORNING OF SURGERY [phone]  REMEMBER:ONLY 1 PER PERSON MAY GO TO SHORT STAY WITH YOU TO GET READY THE MORNING OF YOUR SURGERY  DO NOT EAT FOOD OR DRINK LIQUIDS AFTER MIDNIGHT  TAKE THESE MEDICINES THE MORNING OF SURGERY: SYNTHROID / BUSTOLIC  YOU MAY NOT HAVE ANY METAL ON YOUR BODY INCLUDING HAIR PINS AND PIERCING'S. DO NOT WEAR JEWELRY, MAKEUP, LOTIONS, POWDERS OR PERFUMES. DO NOT WEAR NAIL POLISH. DO NOT SHAVE 48 HRS PRIOR TO SURGERY. MEN MAY SHAVE FACE AND NECK.  DO NOT [place]. [place] IS NOT RESPONSIBLE FOR VALUABLES.  CONTACTS, DENTURES OR PARTIALS MAY NOT BE WORN TO SURGERY. LEAVE SUITCASE IN CAR. CAN BE BROUGHT TO ROOM AFTER SURGERY.  PATIENTS DISCHARGED THE DAY OF SURGERY WILL NOT BE ALLOWED TO DRIVE HOME.  PLEASE READ OVER THE FOLLOWING INSTRUCTION SHEETS _________________________________________________________________________________                                          [place] - PREPARING FOR SURGERY  Before surgery, you can play an important role.  Because skin is not sterile, your skin needs to be as free of germs as possible.  You can reduce the number of germs on your skin by washing with CHG (chlorahexidine gluconate) soap before surgery.  CHG is an antiseptic cleaner which kills germs and bonds with the skin to continue killing germs even after washing. Please DO NOT use if you have an allergy to CHG or antibacterial soaps.  If your skin becomes reddened/irritated stop using the CHG and inform your nurse when you arrive at Short Stay. Do not shave (including legs and underarms) for at least 48 hours prior to the first CHG shower.  You may shave your face. Please follow these  instructions carefully:   1.  Shower with CHG Soap the night before surgery and the  morning of Surgery.   2.  If you choose to wash your hair, wash your hair first as usual with your  normal  Shampoo.   3.  After you shampoo, rinse your hair and body thoroughly to remove the  shampoo.                                         4.  Use CHG as you would any other liquid soap.  You can apply chg directly  to the skin and wash . Gently wash with scrungie or clean wascloth    5.  Apply the CHG Soap to your body ONLY FROM THE NECK DOWN.   Do not use on open                           Wound or open sores. Avoid contact with eyes, ears mouth and genitals (private parts).                        Genitals (private parts) with your normal soap.  6.  Wash thoroughly, paying special attention to the area where your surgery  will be performed.   7.  Thoroughly rinse your body with warm water from the neck down.   8.  DO NOT shower/wash with your normal soap after using and rinsing off  the CHG Soap .                9.  Pat yourself dry with a clean towel.             10.  Wear clean night clothes to bed after shower             11.  Place clean sheets on your bed the night of your first shower and do not  sleep with pets.  Day of Surgery : Do not apply any lotions/deodorants the morning of surgery.  Please wear clean clothes to the hospital/surgery center.  FAILURE TO FOLLOW THESE INSTRUCTIONS MAY RESULT IN THE CANCELLATION OF YOUR SURGERY    PATIENT SIGNATURE_________________________________  ______________________________________________________________________     Jamie Coffey  An incentive spirometer is a tool that can help keep your lungs clear and active. This tool measures how well you are filling your lungs with each breath. Taking long deep breaths may help reverse or decrease the chance of developing breathing (pulmonary) problems (especially infection)  following:  A long period of time when you are unable to move or be active. BEFORE THE PROCEDURE   If the spirometer includes an indicator to show your best effort, your nurse or respiratory therapist will set it to a desired goal.  If possible, sit up straight or lean slightly forward. Try not to slouch.  Hold the incentive spirometer in an upright position. INSTRUCTIONS FOR USE   Sit on the edge of your bed if possible, or sit up as far as you can in bed or on a chair.  Hold the incentive spirometer in an upright position.  Breathe out normally.  Place the mouthpiece in your mouth and seal your lips tightly around it.  Breathe in slowly and as deeply as possible, raising the piston or the ball toward the top of the column.  Hold your breath for 3-5 seconds or for as long as possible. Allow the piston or ball to fall to the bottom of the column.  Remove the mouthpiece from your mouth and breathe out normally.  Rest for a few seconds and repeat Steps 1 through 7 at least 10 times every 1-2 hours when you are awake. Take your time and take a few normal breaths between deep breaths.  The spirometer may include an indicator to show your best effort. Use the indicator as a goal to work toward during each repetition.  After each set of 10 deep breaths, practice coughing to be sure your lungs are clear. If you have an incision (the cut made at the time of surgery), support your incision when coughing by placing a pillow or rolled up towels firmly against it. Once you are able to get out of bed, walk around indoors and cough well. You may stop using the incentive spirometer when instructed by your caregiver.  RISKS AND COMPLICATIONS  Take your time so you do not get dizzy or light-headed.  If you are in pain, you may need to take or ask for pain medication before doing incentive spirometry. It is harder to take a deep breath if you are having pain. AFTER USE  Rest and breathe slowly  and  easily.  It can be helpful to keep track of a log of your progress. Your caregiver can provide you with a simple table to help with this. If you are using the spirometer at home, follow these instructions: [place] IF:   You are having difficultly using the spirometer.  You have trouble using the spirometer as often as instructed.  Your pain medication is not giving enough relief while using the spirometer.  You develop fever of 100.5 F (38.1 C) or higher. SEEK IMMEDIATE MEDICAL CARE IF:   You cough up bloody sputum that had not been present before.  You develop fever of 102 F (38.9 C) or greater.  You develop worsening pain at or near the incision site. MAKE SURE YOU:   Understand these instructions.  Will watch your condition.  Will get help right away if you are not doing well or get worse. Document Released: 12/07/2006 Document Revised: 10/19/2011 Document Reviewed: 02/07/2007 ExitCare Patient Information 2014 ExitCare, [place].   ________________________________________________________________________  WHAT IS A BLOOD TRANSFUSION? Blood Transfusion Information  A transfusion is the replacement of blood or some of its parts. Blood is made up of multiple cells which provide different functions.  Red blood cells carry oxygen and are used for blood loss replacement.  White blood cells fight against infection.  Platelets control bleeding.  Plasma helps clot blood.  Other blood products are available for specialized needs, such as hemophilia or other clotting disorders. BEFORE THE TRANSFUSION  Who gives blood for transfusions?   Healthy volunteers who are fully evaluated to make sure their blood is safe. This is blood bank blood. Transfusion therapy is the safest it has ever been in the practice of medicine. Before blood is taken from a donor, a complete history is taken to make sure that person has no history of diseases nor engages in risky social  behavior (examples are intravenous drug use or sexual activity with multiple partners). The donor's travel history is screened to minimize risk of transmitting infections, such as malaria. The donated blood is tested for signs of infectious diseases, such as HIV and hepatitis. The blood is then tested to be sure it is compatible with you in order to minimize the chance of a transfusion reaction. If you or a relative donates blood, this is often done in anticipation of surgery and is not appropriate for emergency situations. It takes many days to process the donated blood. RISKS AND COMPLICATIONS Although transfusion therapy is very safe and saves many lives, the main dangers of transfusion include:   Getting an infectious disease.  Developing a transfusion reaction. This is an allergic reaction to something in the blood you were given. Every precaution is taken to prevent this. The decision to have a blood transfusion has been considered carefully by your caregiver before blood is given. Blood is not given unless the benefits outweigh the risks. AFTER THE TRANSFUSION  Right after receiving a blood transfusion, you will usually feel much better and more energetic. This is especially true if your red blood cells have gotten low (anemic). The transfusion raises the level of the red blood cells which carry oxygen, and this usually causes an energy increase.  The nurse administering the transfusion will monitor you carefully for complications. HOME CARE INSTRUCTIONS  No special instructions are needed after a transfusion. You may find your energy is better. Speak with your caregiver about any limitations on activity for underlying diseases you may have. SEEK MEDICAL CARE IF:  Your condition is not improving after your transfusion.  You develop redness or irritation at the intravenous (IV) site. SEEK IMMEDIATE MEDICAL CARE IF:  Any of the following symptoms occur over the next 12 hours:  Shaking  chills.  You have a temperature by mouth above 102 F (38.9 C), not controlled by medicine.  Chest, back, or muscle pain.  People around you feel you are not acting correctly or are confused.  Shortness of breath or difficulty breathing.  Dizziness and fainting.  You get a rash or develop hives.  You have a decrease in urine output.  Your urine turns a dark color or changes to pink, red, or brown. Any of the following symptoms occur over the next 10 days:  You have a temperature by mouth above 102 F (38.9 C), not controlled by medicine.  Shortness of breath.  Weakness after normal activity.  The white part of the eye turns yellow (jaundice).  You have a decrease in the amount of urine or are urinating less often.  Your urine turns a dark color or changes to pink, red, or brown. Document Released: 07/24/2000 Document Revised: 10/19/2011 Document Reviewed: 03/12/2008 [facility] Patient Information 2014 [place], [place].  _______________________________________________________________________

## 2015-03-19 ENCOUNTER — Encounter (HOSPITAL_COMMUNITY): Payer: Self-pay

## 2015-03-19 ENCOUNTER — Encounter (HOSPITAL_COMMUNITY)
Admission: RE | Admit: 2015-03-19 | Discharge: 2015-03-19 | Disposition: A | Discharge status: Home / Self Care | Payer: Medicare Other | Source: Ambulatory Visit | Attending: Orthopedic Surgery | Admitting: Orthopedic Surgery

## 2015-03-19 DIAGNOSIS — Z01818 Encounter for other preprocedural examination: Secondary | ICD-10-CM | POA: Diagnosis not present

## 2015-03-19 DIAGNOSIS — Z7901 Long term (current) use of anticoagulants: Secondary | ICD-10-CM | POA: Diagnosis not present

## 2015-03-19 DIAGNOSIS — M179 Osteoarthritis of knee, unspecified: Secondary | ICD-10-CM | POA: Insufficient documentation

## 2015-03-19 HISTORY — DX: Family history of other specified conditions: Z84.89

## 2015-03-19 HISTORY — DX: Personal history of urinary calculi: Z87.442

## 2015-03-19 LAB — URINALYSIS, ROUTINE W REFLEX MICROSCOPIC
Glucose, UA: NEGATIVE mg/dL
HGB URINE DIPSTICK: NEGATIVE
KETONES UR: NEGATIVE mg/dL
Nitrite: NEGATIVE
PH: 6 (ref 5.0–8.0)
Protein, ur: NEGATIVE mg/dL
SPECIFIC GRAVITY, URINE: 1.023 (ref 1.005–1.030)
UROBILINOGEN UA: 0.2 mg/dL (ref 0.0–1.0)

## 2015-03-19 LAB — SURGICAL PCR SCREEN
MRSA, PCR: NEGATIVE
Staphylococcus aureus: NEGATIVE

## 2015-03-19 LAB — URINE MICROSCOPIC-ADD ON

## 2015-03-19 LAB — CBC
HEMATOCRIT: 42.8 % (ref 36.0–46.0)
HEMOGLOBIN: 14.1 g/dL (ref 12.0–15.0)
MCH: 30.3 pg (ref 26.0–34.0)
MCHC: 32.9 g/dL (ref 30.0–36.0)
MCV: 92 fL (ref 78.0–100.0)
Platelets: 232 10*3/uL (ref 150–400)
RBC: 4.65 MIL/uL (ref 3.87–5.11)
RDW: 13.2 % (ref 11.5–15.5)
WBC: 4.6 10*3/uL (ref 4.0–10.5)

## 2015-03-19 LAB — ABO/RH: ABO/RH(D): AB POS

## 2015-03-19 LAB — BASIC METABOLIC PANEL
Anion gap: 6 (ref 5–15)
BUN: 23 mg/dL — AB (ref 6–20)
CO2: 31 mmol/L (ref 22–32)
CREATININE: 0.79 mg/dL (ref 0.44–1.00)
Calcium: 9.6 mg/dL (ref 8.9–10.3)
Chloride: 105 mmol/L (ref 101–111)
GFR calc Af Amer: >60 mL/min (ref >60)
GFR calc non Af Amer: >60 mL/min (ref >60)
GLUCOSE: 90 mg/dL (ref 65–99)
POTASSIUM: 4.8 mmol/L (ref 3.5–5.1)
Sodium: 142 mmol/L (ref 135–145)

## 2015-03-19 LAB — APTT: APTT: 35 s (ref 24–37)

## 2015-03-19 LAB — PROTIME-INR
INR: 2.37 — AB (ref 0.00–1.49)
PROTHROMBIN TIME: 25.6 s — AB (ref 11.6–15.2)

## 2015-03-19 NOTE — Progress Notes (Signed)
Abnormal UA faxed to Dr.Olin 

## 2015-03-20 ENCOUNTER — Encounter: Payer: Self-pay | Admitting: Cardiology

## 2015-03-20 NOTE — Progress Notes (Signed)
Note received from [place] office - Rx Cipro called into [facility]

## 2015-03-24 ENCOUNTER — Other Ambulatory Visit: Payer: Self-pay | Admitting: Cardiology

## 2015-03-25 ENCOUNTER — Inpatient Hospital Stay (HOSPITAL_COMMUNITY): Payer: Medicare Other | Admitting: Certified Registered Nurse Anesthetist

## 2015-03-25 ENCOUNTER — Encounter (HOSPITAL_COMMUNITY): Payer: Self-pay | Admitting: *Deleted

## 2015-03-25 ENCOUNTER — Encounter (HOSPITAL_COMMUNITY)
Admission: RE | Disposition: A | Discharge status: Home Health | Payer: Self-pay | Source: Ambulatory Visit | Attending: Orthopedic Surgery

## 2015-03-25 ENCOUNTER — Inpatient Hospital Stay (HOSPITAL_COMMUNITY)
Admission: RE | Admit: 2015-03-25 | Discharge: 2015-03-26 | DRG: 470 | Disposition: A | Discharge status: Home Health | Payer: Medicare Other | Source: Ambulatory Visit | Attending: Orthopedic Surgery | Admitting: Orthopedic Surgery

## 2015-03-25 DIAGNOSIS — K219 Gastro-esophageal reflux disease without esophagitis: Secondary | ICD-10-CM | POA: Diagnosis present

## 2015-03-25 DIAGNOSIS — I10 Essential (primary) hypertension: Secondary | ICD-10-CM | POA: Diagnosis present

## 2015-03-25 DIAGNOSIS — Z01812 Encounter for preprocedural laboratory examination: Secondary | ICD-10-CM

## 2015-03-25 DIAGNOSIS — Z853 Personal history of malignant neoplasm of breast: Secondary | ICD-10-CM

## 2015-03-25 DIAGNOSIS — M1711 Unilateral primary osteoarthritis, right knee: Secondary | ICD-10-CM | POA: Diagnosis not present

## 2015-03-25 DIAGNOSIS — Z90710 Acquired absence of both cervix and uterus: Secondary | ICD-10-CM

## 2015-03-25 DIAGNOSIS — Z8249 Family history of ischemic heart disease and other diseases of the circulatory system: Secondary | ICD-10-CM | POA: Diagnosis not present

## 2015-03-25 DIAGNOSIS — E039 Hypothyroidism, unspecified: Secondary | ICD-10-CM | POA: Diagnosis present

## 2015-03-25 DIAGNOSIS — Z7901 Long term (current) use of anticoagulants: Secondary | ICD-10-CM

## 2015-03-25 DIAGNOSIS — Z86711 Personal history of pulmonary embolism: Secondary | ICD-10-CM

## 2015-03-25 DIAGNOSIS — F329 Major depressive disorder, single episode, unspecified: Secondary | ICD-10-CM | POA: Diagnosis present

## 2015-03-25 DIAGNOSIS — M179 Osteoarthritis of knee, unspecified: Secondary | ICD-10-CM | POA: Diagnosis not present

## 2015-03-25 DIAGNOSIS — I482 Chronic atrial fibrillation: Secondary | ICD-10-CM | POA: Diagnosis present

## 2015-03-25 DIAGNOSIS — Z86718 Personal history of other venous thrombosis and embolism: Secondary | ICD-10-CM | POA: Diagnosis not present

## 2015-03-25 DIAGNOSIS — Z96659 Presence of unspecified artificial knee joint: Secondary | ICD-10-CM

## 2015-03-25 DIAGNOSIS — Z96651 Presence of right artificial knee joint: Secondary | ICD-10-CM

## 2015-03-25 DIAGNOSIS — M659 Synovitis and tenosynovitis, unspecified: Secondary | ICD-10-CM | POA: Diagnosis present

## 2015-03-25 DIAGNOSIS — D6851 Activated protein C resistance: Secondary | ICD-10-CM | POA: Diagnosis present

## 2015-03-25 DIAGNOSIS — M25561 Pain in right knee: Secondary | ICD-10-CM | POA: Diagnosis not present

## 2015-03-25 HISTORY — PX: TOTAL KNEE ARTHROPLASTY: SHX125

## 2015-03-25 LAB — PROTIME-INR
INR: 1.05 (ref 0.00–1.49)
Prothrombin Time: 13.9 seconds (ref 11.6–15.2)

## 2015-03-25 LAB — TYPE AND SCREEN
ABO/RH(D): AB POS
ANTIBODY SCREEN: NEGATIVE

## 2015-03-25 SURGERY — ARTHROPLASTY, KNEE, TOTAL
Anesthesia: Spinal | Site: Knee | Laterality: Right

## 2015-03-25 MED ORDER — POLYETHYLENE GLYCOL 3350 17 G PO PACK
17.0000 g | PACK | Freq: Two times a day (BID) | ORAL | Status: DC
Start: 2015-03-25 — End: 2015-03-26
  Administered 2015-03-25 – 2015-03-26 (×2): 17 g via ORAL

## 2015-03-25 MED ORDER — WARFARIN - PHARMACIST DOSING INPATIENT: Freq: Every day | Status: DC

## 2015-03-25 MED ORDER — ONDANSETRON HCL 4 MG/2ML IJ SOLN
INTRAMUSCULAR | Status: DC | PRN
  Administered 2015-03-25: 4 mg via INTRAVENOUS

## 2015-03-25 MED ORDER — ONDANSETRON HCL 4 MG/2ML IJ SOLN
INTRAMUSCULAR | Status: AC
  Filled 2015-03-25: qty 2

## 2015-03-25 MED ORDER — BUPIVACAINE-EPINEPHRINE (PF) 0.25% -1:200000 IJ SOLN
INTRAMUSCULAR | Status: AC
Start: 2015-03-25 — End: 2015-03-25
  Filled 2015-03-25: qty 30

## 2015-03-25 MED ORDER — DEXAMETHASONE SODIUM PHOSPHATE 10 MG/ML IJ SOLN
INTRAMUSCULAR | Status: AC
Start: 2015-03-25 — End: 2015-03-25
  Filled 2015-03-25: qty 1

## 2015-03-25 MED ORDER — NEBIVOLOL HCL 2.5 MG PO TABS
2.5000 mg | ORAL_TABLET | Freq: Every day | ORAL | Status: DC
  Administered 2015-03-26: 2.5 mg via ORAL
  Filled 2015-03-25: qty 1

## 2015-03-25 MED ORDER — CEFAZOLIN SODIUM-DEXTROSE 2-3 GM-% IV SOLR
2.0000 g | Freq: Four times a day (QID) | INTRAVENOUS | Status: AC
  Administered 2015-03-25 – 2015-03-26 (×2): 2 g via INTRAVENOUS
  Filled 2015-03-25 (×2): qty 50

## 2015-03-25 MED ORDER — METHOCARBAMOL 1000 MG/10ML IJ SOLN
500.0000 mg | Freq: Four times a day (QID) | INTRAVENOUS | Status: DC | PRN
  Administered 2015-03-25: 500 mg via INTRAVENOUS
  Filled 2015-03-25 (×2): qty 5

## 2015-03-25 MED ORDER — BUPIVACAINE-EPINEPHRINE (PF) 0.25% -1:200000 IJ SOLN
INTRAMUSCULAR | Status: DC | PRN
  Administered 2015-03-25: 30 mL

## 2015-03-25 MED ORDER — MENTHOL 3 MG MT LOZG: 1.0000 | LOZENGE | OROMUCOSAL | Status: DC | PRN

## 2015-03-25 MED ORDER — HYDROMORPHONE HCL 1 MG/ML IJ SOLN
0.5000 mg | INTRAMUSCULAR | Status: DC | PRN
  Administered 2015-03-25 (×2): 0.5 mg via INTRAVENOUS
  Filled 2015-03-25 (×2): qty 1

## 2015-03-25 MED ORDER — KETOROLAC TROMETHAMINE 30 MG/ML IJ SOLN
INTRAMUSCULAR | Status: AC
  Filled 2015-03-25: qty 1

## 2015-03-25 MED ORDER — FENTANYL CITRATE (PF) 100 MCG/2ML IJ SOLN
INTRAMUSCULAR | Status: AC
  Filled 2015-03-25: qty 4

## 2015-03-25 MED ORDER — CEFAZOLIN SODIUM-DEXTROSE 2-3 GM-% IV SOLR
INTRAVENOUS | Status: AC
  Filled 2015-03-25: qty 50

## 2015-03-25 MED ORDER — BUPIVACAINE IN DEXTROSE 0.75-8.25 % IT SOLN
INTRATHECAL | Status: DC | PRN
  Administered 2015-03-25: 1.6 mL via INTRATHECAL

## 2015-03-25 MED ORDER — METOCLOPRAMIDE HCL 10 MG PO TABS: 5.0000 mg | ORAL_TABLET | Freq: Three times a day (TID) | ORAL | Status: DC | PRN

## 2015-03-25 MED ORDER — LACTATED RINGERS IV SOLN
INTRAVENOUS | Status: DC
  Administered 2015-03-25 (×2): 1000 mL via INTRAVENOUS

## 2015-03-25 MED ORDER — ONDANSETRON HCL 4 MG PO TABS: 4.0000 mg | ORAL_TABLET | Freq: Four times a day (QID) | ORAL | Status: DC | PRN

## 2015-03-25 MED ORDER — DIPHENHYDRAMINE HCL 25 MG PO CAPS: 25.0000 mg | ORAL_CAPSULE | Freq: Four times a day (QID) | ORAL | Status: DC | PRN

## 2015-03-25 MED ORDER — PROPOFOL 10 MG/ML IV BOLUS
INTRAVENOUS | Status: AC
  Filled 2015-03-25: qty 20

## 2015-03-25 MED ORDER — KETOROLAC TROMETHAMINE 30 MG/ML IJ SOLN
INTRAMUSCULAR | Status: DC | PRN
  Administered 2015-03-25: 30 mg

## 2015-03-25 MED ORDER — HYDROMORPHONE HCL 1 MG/ML IJ SOLN
INTRAMUSCULAR | Status: AC
  Filled 2015-03-25: qty 1

## 2015-03-25 MED ORDER — MIDAZOLAM HCL 2 MG/2ML IJ SOLN
INTRAMUSCULAR | Status: AC
  Filled 2015-03-25: qty 4

## 2015-03-25 MED ORDER — PROPOFOL INFUSION 10 MG/ML OPTIME
INTRAVENOUS | Status: DC | PRN
  Administered 2015-03-25: 140 ug/kg/min via INTRAVENOUS

## 2015-03-25 MED ORDER — CELECOXIB 200 MG PO CAPS
200.0000 mg | ORAL_CAPSULE | Freq: Two times a day (BID) | ORAL | Status: DC
  Administered 2015-03-25 – 2015-03-26 (×2): 200 mg via ORAL
  Filled 2015-03-25 (×3): qty 1

## 2015-03-25 MED ORDER — METHOCARBAMOL 500 MG PO TABS
500.0000 mg | ORAL_TABLET | Freq: Four times a day (QID) | ORAL | Status: DC | PRN
  Administered 2015-03-26: 500 mg via ORAL
  Filled 2015-03-25: qty 1

## 2015-03-25 MED ORDER — MAGNESIUM CITRATE PO SOLN: 1.0000 | Freq: Once | ORAL | Status: DC | PRN

## 2015-03-25 MED ORDER — PHENOL 1.4 % MT LIQD: 1.0000 | OROMUCOSAL | Status: DC | PRN

## 2015-03-25 MED ORDER — VENLAFAXINE HCL ER 75 MG PO CP24
75.0000 mg | ORAL_CAPSULE | Freq: Every day | ORAL | Status: DC
  Administered 2015-03-25: 75 mg via ORAL
  Filled 2015-03-25 (×3): qty 1

## 2015-03-25 MED ORDER — DEXAMETHASONE SODIUM PHOSPHATE 10 MG/ML IJ SOLN
10.0000 mg | Freq: Once | INTRAMUSCULAR | Status: AC
  Administered 2015-03-26: 10 mg via INTRAVENOUS
  Filled 2015-03-25: qty 1

## 2015-03-25 MED ORDER — WARFARIN SODIUM 5 MG PO TABS
5.0000 mg | ORAL_TABLET | Freq: Once | ORAL | Status: AC
  Administered 2015-03-25: 5 mg via ORAL
  Filled 2015-03-25: qty 1

## 2015-03-25 MED ORDER — MEPERIDINE HCL 50 MG/ML IJ SOLN: 6.2500 mg | INTRAMUSCULAR | Status: DC | PRN

## 2015-03-25 MED ORDER — MIDAZOLAM HCL 5 MG/5ML IJ SOLN
INTRAMUSCULAR | Status: DC | PRN
  Administered 2015-03-25 (×2): 1 mg via INTRAVENOUS

## 2015-03-25 MED ORDER — HYDROCODONE-ACETAMINOPHEN 7.5-325 MG PO TABS
1.0000 | ORAL_TABLET | ORAL | Status: DC
  Administered 2015-03-25 – 2015-03-26 (×6): 2 via ORAL
  Filled 2015-03-25 (×7): qty 2

## 2015-03-25 MED ORDER — METOCLOPRAMIDE HCL 5 MG/ML IJ SOLN: 5.0000 mg | Freq: Three times a day (TID) | INTRAMUSCULAR | Status: DC | PRN

## 2015-03-25 MED ORDER — DEXAMETHASONE SODIUM PHOSPHATE 10 MG/ML IJ SOLN
INTRAMUSCULAR | Status: DC | PRN
  Administered 2015-03-25: 10 mg via INTRAVENOUS

## 2015-03-25 MED ORDER — CEFAZOLIN SODIUM-DEXTROSE 2-3 GM-% IV SOLR
2.0000 g | INTRAVENOUS | Status: AC
  Administered 2015-03-25: 2 g via INTRAVENOUS

## 2015-03-25 MED ORDER — LACTATED RINGERS IV SOLN
INTRAVENOUS | Status: DC | PRN
  Administered 2015-03-25 (×2): via INTRAVENOUS

## 2015-03-25 MED ORDER — CHLORHEXIDINE GLUCONATE 4 % EX LIQD: 60.0000 mL | Freq: Once | CUTANEOUS | Status: DC

## 2015-03-25 MED ORDER — SODIUM CHLORIDE 0.9 % IR SOLN
Status: DC | PRN
  Administered 2015-03-25: 1000 mL

## 2015-03-25 MED ORDER — SODIUM CHLORIDE 0.9 % IV SOLN
2000.0000 mg | INTRAVENOUS | Status: DC | PRN
  Administered 2015-03-25: 2000 mg via TOPICAL

## 2015-03-25 MED ORDER — MIDAZOLAM HCL 2 MG/2ML IJ SOLN: 0.5000 mg | Freq: Once | INTRAMUSCULAR | Status: DC | PRN

## 2015-03-25 MED ORDER — HYDROMORPHONE HCL 1 MG/ML IJ SOLN
0.2500 mg | INTRAMUSCULAR | Status: DC | PRN
  Administered 2015-03-25 (×2): 0.5 mg via INTRAVENOUS

## 2015-03-25 MED ORDER — FERROUS SULFATE 325 (65 FE) MG PO TABS
325.0000 mg | ORAL_TABLET | Freq: Three times a day (TID) | ORAL | Status: DC
Start: 2015-03-25 — End: 2015-03-26
  Administered 2015-03-25 – 2015-03-26 (×3): 325 mg via ORAL
  Filled 2015-03-25 (×5): qty 1

## 2015-03-25 MED ORDER — AMLODIPINE BESYLATE 2.5 MG PO TABS
2.5000 mg | ORAL_TABLET | Freq: Every day | ORAL | Status: DC
  Administered 2015-03-25 – 2015-03-26 (×2): 2.5 mg via ORAL
  Filled 2015-03-25 (×2): qty 1

## 2015-03-25 MED ORDER — ONDANSETRON HCL 4 MG/2ML IJ SOLN: 4.0000 mg | Freq: Four times a day (QID) | INTRAMUSCULAR | Status: DC | PRN

## 2015-03-25 MED ORDER — SODIUM CHLORIDE 0.9 % IV SOLN
INTRAVENOUS | Status: DC
  Administered 2015-03-25: 13:00:00 via INTRAVENOUS
  Filled 2015-03-25 (×5): qty 1000

## 2015-03-25 MED ORDER — DEXAMETHASONE SODIUM PHOSPHATE 10 MG/ML IJ SOLN: 10.0000 mg | Freq: Once | INTRAMUSCULAR | Status: DC

## 2015-03-25 MED ORDER — SODIUM CHLORIDE 0.9 % IJ SOLN
INTRAMUSCULAR | Status: DC | PRN
  Administered 2015-03-25: 29 mL

## 2015-03-25 MED ORDER — DOCUSATE SODIUM 100 MG PO CAPS
100.0000 mg | ORAL_CAPSULE | Freq: Two times a day (BID) | ORAL | Status: DC
  Administered 2015-03-25 – 2015-03-26 (×2): 100 mg via ORAL

## 2015-03-25 MED ORDER — PROMETHAZINE HCL 25 MG/ML IJ SOLN: 6.2500 mg | INTRAMUSCULAR | Status: DC | PRN

## 2015-03-25 MED ORDER — 0.9 % SODIUM CHLORIDE (POUR BTL) OPTIME
TOPICAL | Status: DC | PRN
  Administered 2015-03-25: 1000 mL

## 2015-03-25 MED ORDER — ZOLPIDEM TARTRATE 5 MG PO TABS: 5.0000 mg | ORAL_TABLET | Freq: Every evening | ORAL | Status: DC | PRN

## 2015-03-25 MED ORDER — FENTANYL CITRATE (PF) 100 MCG/2ML IJ SOLN
INTRAMUSCULAR | Status: DC | PRN
  Administered 2015-03-25 (×2): 50 ug via INTRAVENOUS

## 2015-03-25 MED ORDER — LEVOTHYROXINE SODIUM 75 MCG PO TABS
75.0000 ug | ORAL_TABLET | Freq: Every day | ORAL | Status: DC
  Administered 2015-03-26: 75 ug via ORAL
  Filled 2015-03-25 (×3): qty 1

## 2015-03-25 MED ORDER — SODIUM CHLORIDE 0.9 % IJ SOLN
INTRAMUSCULAR | Status: AC
  Filled 2015-03-25: qty 50

## 2015-03-25 MED ORDER — BISACODYL 10 MG RE SUPP: 10.0000 mg | Freq: Every day | RECTAL | Status: DC | PRN

## 2015-03-25 MED ORDER — ALUM & MAG HYDROXIDE-SIMETH 200-200-20 MG/5ML PO SUSP: 30.0000 mL | ORAL | Status: DC | PRN

## 2015-03-25 MED ORDER — ENOXAPARIN SODIUM 60 MG/0.6ML ~~LOC~~ SOLN
60.0000 mg | Freq: Two times a day (BID) | SUBCUTANEOUS | Status: DC
  Administered 2015-03-26: 60 mg via SUBCUTANEOUS
  Filled 2015-03-25 (×3): qty 0.6

## 2015-03-25 MED ORDER — TRANEXAMIC ACID 1000 MG/10ML IV SOLN
2000.0000 mg | Freq: Once | INTRAVENOUS | Status: DC
  Filled 2015-03-25: qty 20

## 2015-03-25 SURGICAL SUPPLY — 62 items
BAG DECANTER FOR FLEXI CONT (MISCELLANEOUS) ×2 IMPLANT
BAG SPEC THK2 15X12 ZIP CLS (MISCELLANEOUS)
BAG ZIPLOCK 12X15 (MISCELLANEOUS) IMPLANT
BANDAGE ELASTIC 6 VELCRO ST LF (GAUZE/BANDAGES/DRESSINGS) ×3 IMPLANT
BANDAGE ESMARK 6X9 LF (GAUZE/BANDAGES/DRESSINGS) ×1 IMPLANT
BLADE SAW SGTL 13.0X1.19X90.0M (BLADE) ×3 IMPLANT
BNDG CMPR 9X6 STRL LF SNTH (GAUZE/BANDAGES/DRESSINGS) ×1
BNDG ESMARK 6X9 LF (GAUZE/BANDAGES/DRESSINGS) ×3
BOWL SMART MIX CTS (DISPOSABLE) ×3 IMPLANT
CAPT KNEE TOTAL 3 ATTUNE ×2 IMPLANT
CEMENT HV SMART SET (Cement) ×4 IMPLANT
CUFF TOURN SGL QUICK 34 (TOURNIQUET CUFF) ×3
CUFF TRNQT CYL 34X4X40X1 (TOURNIQUET CUFF) ×1 IMPLANT
DECANTER SPIKE VIAL GLASS SM (MISCELLANEOUS) ×5 IMPLANT
DRAPE EXTREMITY T 121X128X90 (DRAPE) ×3 IMPLANT
DRAPE POUCH INSTRU U-SHP 10X18 (DRAPES) ×3 IMPLANT
DRAPE U-SHAPE 47X51 STRL (DRAPES) ×3 IMPLANT
DRSG AQUACEL AG ADV 3.5X10 (GAUZE/BANDAGES/DRESSINGS) ×3 IMPLANT
DURAPREP 26ML APPLICATOR (WOUND CARE) ×6 IMPLANT
ELECT REM PT RETURN 9FT ADLT (ELECTROSURGICAL) ×3
ELECTRODE REM PT RTRN 9FT ADLT (ELECTROSURGICAL) ×1 IMPLANT
FACESHIELD WRAPAROUND (MASK) ×15 IMPLANT
FACESHIELD WRAPAROUND OR TEAM (MASK) ×5 IMPLANT
GLOVE BIO SURGEON STRL SZ7.5 (GLOVE) ×3 IMPLANT
GLOVE BIOGEL PI IND STRL 6.5 (GLOVE) IMPLANT
GLOVE BIOGEL PI IND STRL 7.0 (GLOVE) IMPLANT
GLOVE BIOGEL PI IND STRL 7.5 (GLOVE) ×1 IMPLANT
GLOVE BIOGEL PI IND STRL 8.5 (GLOVE) ×1 IMPLANT
GLOVE BIOGEL PI INDICATOR 6.5 (GLOVE) ×2
GLOVE BIOGEL PI INDICATOR 7.0 (GLOVE) ×2
GLOVE BIOGEL PI INDICATOR 7.5 (GLOVE) ×4
GLOVE BIOGEL PI INDICATOR 8.5 (GLOVE) ×2
GLOVE ECLIPSE 8.0 STRL XLNG CF (GLOVE) ×5 IMPLANT
GLOVE ORTHO TXT STRL SZ7.5 (GLOVE) ×6 IMPLANT
GLOVE SURG SS PI 7.0 STRL IVOR (GLOVE) ×2 IMPLANT
GOWN SPEC L3 XXLG W/TWL (GOWN DISPOSABLE) ×3 IMPLANT
GOWN STRL REUS W/TWL LRG LVL3 (GOWN DISPOSABLE) ×5 IMPLANT
GOWN STRL REUS W/TWL XL LVL3 (GOWN DISPOSABLE) ×2 IMPLANT
HANDPIECE INTERPULSE COAX TIP (DISPOSABLE) ×3
KIT BASIN OR (CUSTOM PROCEDURE TRAY) ×3 IMPLANT
LIQUID BAND (GAUZE/BANDAGES/DRESSINGS) ×3 IMPLANT
MANIFOLD NEPTUNE II (INSTRUMENTS) ×3 IMPLANT
NDL SAFETY ECLIPSE 18X1.5 (NEEDLE) ×1 IMPLANT
NEEDLE HYPO 18GX1.5 SHARP (NEEDLE) ×6
PACK TOTAL JOINT (CUSTOM PROCEDURE TRAY) ×3 IMPLANT
PEN SKIN MARKING BROAD (MISCELLANEOUS) ×3 IMPLANT
POSITIONER SURGICAL ARM (MISCELLANEOUS) ×3 IMPLANT
SET HNDPC FAN SPRY TIP SCT (DISPOSABLE) ×1 IMPLANT
SET PAD KNEE POSITIONER (MISCELLANEOUS) ×3 IMPLANT
SUCTION FRAZIER 12FR DISP (SUCTIONS) ×3 IMPLANT
SUT MNCRL AB 4-0 PS2 18 (SUTURE) ×3 IMPLANT
SUT VIC AB 1 CT1 36 (SUTURE) ×6 IMPLANT
SUT VIC AB 2-0 CT1 27 (SUTURE) ×9
SUT VIC AB 2-0 CT1 TAPERPNT 27 (SUTURE) ×3 IMPLANT
SUT VLOC 180 0 24IN GS25 (SUTURE) ×3 IMPLANT
SYR 50ML LL SCALE MARK (SYRINGE) ×5 IMPLANT
TOWEL OR 17X26 10 PK STRL BLUE (TOWEL DISPOSABLE) ×3 IMPLANT
TOWEL OR NON WOVEN STRL DISP B (DISPOSABLE) ×2 IMPLANT
TRAY FOLEY W/METER SILVER 14FR (SET/KITS/TRAYS/PACK) ×3 IMPLANT
WATER STERILE IRR 1500ML POUR (IV SOLUTION) ×3 IMPLANT
WRAP KNEE MAXI GEL POST OP (GAUZE/BANDAGES/DRESSINGS) ×3 IMPLANT
YANKAUER SUCT BULB TIP 10FT TU (MISCELLANEOUS) ×3 IMPLANT

## 2015-03-25 NOTE — Progress Notes (Signed)
Utilization review completed.  

## 2015-03-25 NOTE — Evaluation (Signed)
Physical Therapy Evaluation Patient Details Name: Jamie Coffey MRN: [id number] DOB: September 16, 1940 Today's Date: 03/25/2015   History of Present Illness  R TKR  Clinical Impression  Pt s/p R TKR presents with decreased R LE strength/ROM and post op pain limiting functional mobility.  Pt should progress well to dc home with family assist and HHPT follow up.    Follow Up Recommendations Home health PT    Equipment Recommendations  None recommended by PT    Recommendations for Other Services OT consult     Precautions / Restrictions Precautions Precautions: Knee;Fall Restrictions Weight Bearing Restrictions: No Other Position/Activity Restrictions: WBAT      Mobility  Bed Mobility Overal bed mobility: Needs Assistance Bed Mobility: Supine to Sit;Sit to Supine     Supine to sit: Min assist Sit to supine: Min assist   General bed mobility comments: cues for sequence and use of L LE to self assist  Transfers Overall transfer level: Needs assistance Equipment used: Rolling walker (2 wheeled) Transfers: Sit to/from Stand Sit to Stand: Min assist         General transfer comment: cues for LE management and use of UEs to self assist  Ambulation/Gait Ambulation/Gait assistance: Min assist Ambulation Distance (Feet): 34 Feet Assistive device: Rolling walker (2 wheeled) Gait Pattern/deviations: Step-to pattern;Decreased step length - right;Decreased step length - left;Shuffle;Trunk flexed     General Gait Details: cues for sequence, posture and position from [employer]            Wheelchair Mobility    Modified Rankin (Stroke Patients Only)       Balance                                             Pertinent Vitals/Pain Pain Assessment: 0-10 Pain Score: 5  Pain Location: R knee Pain Descriptors / Indicators: Aching;Sore Pain Intervention(s): Limited activity within patient's tolerance;Monitored during session;Premedicated before  session;Ice applied    Home Living Family/patient expects to be discharged to:: Private residence Living Arrangements: Spouse/significant other Available Help at Discharge: Family Type of Home: House Home Access: Stairs to enter   Technical brewer of Steps: 1 Home Layout: One level Home Equipment: Environmental consultant - standard Additional Comments: Pt's husband is checking on condition of walker    Prior Function Level of Independence: Independent               Hand Dominance        Extremity/Trunk Assessment   Upper Extremity Assessment: Overall WFL for tasks assessed           Lower Extremity Assessment: RLE deficits/detail      Cervical / Trunk Assessment: Normal  Communication   Communication: No difficulties  Cognition Arousal/Alertness: Awake/alert Behavior During Therapy: WFL for tasks assessed/performed Overall Cognitive Status: Within Functional Limits for tasks assessed                      General Comments      Exercises        Assessment/Plan    PT Assessment Patient needs continued PT services  PT Diagnosis Difficulty walking   PT Problem List Decreased strength;Decreased range of motion;Decreased activity tolerance;Decreased mobility;Decreased knowledge of use of DME;Pain;Decreased knowledge of precautions  PT Treatment Interventions DME instruction;Gait training;Stair training;Functional mobility training;Therapeutic activities;Therapeutic exercise;Patient/family education   PT Goals (Current goals can  be found in the Care Plan section) Acute Rehab PT Goals Patient Stated Goal: Resume previous lifestyle with decreased pain PT Goal Formulation: With patient Time For Goal Achievement: 03/28/15 Potential to Achieve Goals: Good    Frequency 7X/week   Barriers to discharge        Co-evaluation               End of Session Equipment Utilized During Treatment: Gait belt Activity Tolerance: Patient tolerated treatment  well Patient left: in bed;with call bell/phone within reach;with family/visitor present Nurse Communication: Mobility status         Time: 0355-9741 PT Time Calculation (min) (ACUTE ONLY): 24 min   Charges:   PT Evaluation $Initial PT Evaluation Tier I: 1 Procedure PT Treatments $Gait Training: 8-22 mins   PT G Codes:        Jamie Coffey 2015/03/28, 6:36 PM

## 2015-03-25 NOTE — Anesthesia Preprocedure Evaluation (Addendum)
Anesthesia Evaluation  Patient identified by MRN, date of birth, ID band Patient awake    Reviewed: Allergy & Precautions, NPO status , Patient's Chart, lab work & pertinent test results  History of Anesthesia Complications Negative for: history of anesthetic complications  Airway Mallampati: I  TM Distance: >3 FB Neck ROM: Full    Dental  (+) Dental Advisory Given, Teeth Intact, Caps   Pulmonary PE (remote PE: Factor V Leiden) breath sounds clear to auscultation        Cardiovascular hypertension, Pt. on medications and Pt. on home beta blockers - anginaDVT + dysrhythmias Atrial Fibrillation Rhythm:Regular Rate:Normal  12/2013 MV: EF 65%, no ischemia/infarct 6/16 ECHO: EF 55-60%, mod AI, mild TR, trivial MR   Neuro/Psych negative neurological ROS     GI/Hepatic Neg liver ROS, GERD-  Medicated and Controlled,  Endo/Other  Hypothyroidism   Renal/GU negative Renal ROS     Musculoskeletal  (+) Arthritis -, Osteoarthritis,    Abdominal   Peds  Hematology  (+) Blood dyscrasia (factor V leiden: off coumadin since wed), ,   Anesthesia Other Findings   Reproductive/Obstetrics                          Anesthesia Physical Anesthesia Plan  ASA: III  Anesthesia Plan: Spinal   Post-op Pain Management:    Induction:   Airway Management Planned: Natural Airway and Simple Face Mask  Additional Equipment:   Intra-op Plan:   Post-operative Plan:   Informed Consent: I have reviewed the patients History and Physical, chart, labs and discussed the procedure including the risks, benefits and alternatives for the proposed anesthesia with the patient or authorized representative who has indicated his/her understanding and acceptance.   Dental advisory given  Plan Discussed with: CRNA and Surgeon  Anesthesia Plan Comments: (Plan routine monitors, SAB)        Anesthesia Quick Evaluation

## 2015-03-25 NOTE — Anesthesia Postprocedure Evaluation (Signed)
  Anesthesia Post-op Note  Patient: Jamie Coffey  Procedure(s) Performed: Procedure(s): RIGHT TOTAL KNEE ARTHROPLASTY (Right)  Patient Location: PACU  Anesthesia Type:Spinal  Level of Consciousness: awake, alert , oriented, patient cooperative and responds to stimulation  Airway and Oxygen Therapy: Patient Spontanous Breathing  Post-op Pain: mild  Post-op Assessment: Post-op Vital signs reviewed, Patient's Cardiovascular Status Stable, Respiratory Function Stable, Patent Airway, No signs of Nausea or vomiting, Pain level controlled, No headache and Spinal receding LLE Motor Response: Purposeful movement (moves leg side to side, wiggles toes) LLE Sensation: Decreased RLE Motor Response: Purposeful movement RLE Sensation: Decreased (moves leg side to side, wiggles toes) L Sensory Level: L5-Outer lower leg, top of foot, great toe R Sensory Level: L5-Outer lower leg, top of foot, great toe  Post-op Vital Signs: Reviewed and stable  Last Vitals:  Filed Vitals:   03/25/15 1241  BP: 143/64  Pulse: 50  Temp: 36.6 C  Resp: 16    Complications: No apparent anesthesia complications

## 2015-03-25 NOTE — Interval H&P Note (Signed)
History and Physical Interval Note:  03/25/2015 8:49 AM  Jamie Coffey  has presented today for surgery, with the diagnosis of RIGHT KNEE OA  The various methods of treatment have been discussed with the patient and family. After consideration of risks, benefits and other options for treatment, the patient has consented to  Procedure(s): RIGHT TOTAL KNEE ARTHROPLASTY (Right) as a surgical intervention .  The patient's history has been reviewed, patient examined, no change in status, stable for surgery.  I have reviewed the patient's chart and labs.  Questions were answered to the patient's satisfaction.     Mauri Pole

## 2015-03-25 NOTE — Transfer of Care (Signed)
Immediate Anesthesia Transfer of Care Note  Patient: Jamie Coffey  Procedure(s) Performed: Procedure(s): RIGHT TOTAL KNEE ARTHROPLASTY (Right)  Patient Location: PACU  Anesthesia Type:Spinal  Level of Consciousness: awake, alert , oriented and patient cooperative  Airway & Oxygen Therapy: Patient Spontanous Breathing and Patient connected to face mask oxygen  Post-op Assessment: Report given to RN and Post -op Vital signs reviewed and stable  Post vital signs: Reviewed and stable  Last Vitals:  Filed Vitals:   03/25/15 0716  Pulse: 60  Temp: 36.5 C  Resp: 18    Complications: No apparent anesthesia complications

## 2015-03-25 NOTE — Progress Notes (Signed)
ANTICOAGULATION CONSULT NOTE - Initial Consult  Pharmacy Consult for Warfarin Indication: atrial fibrillation and VTE prophylaxis  No Known Allergies  Patient Measurements: Height: 5' 5.5" (166.4 cm) Weight: 151 lb (68.493 kg) IBW/kg (Calculated) : 58.15  Vital Signs: Temp: 97.8 F (36.6 C) (08/15 1241) Temp Source: Oral (08/15 0716) BP: 143/64 mmHg (08/15 1241) Pulse Rate: 50 (08/15 1241)  Labs:  Recent Labs  03/25/15 0750  LABPROT 13.9  INR 1.05    Estimated Creatinine Clearance: 58.4 mL/min (by C-G formula based on Cr of 0.79).   Medical History: Past Medical History  Diagnosis Date  . PAF (paroxysmal atrial fibrillation)     a. 05/2011 Echo: EF 70%, triv MR, mild AI, mod TR, Gr 1 DD.  . Factor 5 Leiden mutation, heterozygous     a. chronic xarelto.  . Chest pain, unspecified     a. 12/2013 MV: EF 65%, no ischemia/infarct.  . Mitral valve prolapse   . Dysphagia   . GERD (gastroesophageal reflux disease)   . History of DVT & PE ~ 2000    S/P RLE knee scope  . Hypertension   . Breast cancer 2010    "left", s/p XRT  . Hypothyroidism   . H/O hiatal hernia   . Arthritis     "joints" (01/18/2014)  . Sleep deprivation     "for years and years" (01/18/2014)  . Family history of adverse reaction to anesthesia     mother developed irreg heart rate during surgery  . History of kidney stones   . Difficulty sleeping     dificulty staying asleep  . Depression   . Blood dyscrasia     factor 5 Leiden  . Sleep terrors     Medications:  Scheduled:  . amLODipine  2.5 mg Oral Daily  .  ceFAZolin (ANCEF) IV  2 g Intravenous Q6H  . celecoxib  200 mg Oral Q12H  . [START ON 03/26/2015] dexamethasone  10 mg Intravenous Once  . docusate sodium  100 mg Oral BID  . [START ON 03/26/2015] enoxaparin  60 mg Subcutaneous Q12H  . ferrous sulfate  325 mg Oral TID PC  . HYDROcodone-acetaminophen  1-2 tablet Oral Q4H  . HYDROmorphone      . [START ON 03/26/2015] levothyroxine  75  mcg Oral QAC breakfast  . [START ON 03/26/2015] nebivolol  2.5 mg Oral Daily  . polyethylene glycol  17 g Oral BID  . venlafaxine XR  75 mg Oral Q breakfast   Infusions:  . sodium chloride 0.9 % 1,000 mL with potassium chloride 10 mEq infusion      Assessment: 73 yoF admitted on 8/15 with right knee osteoarthritis, s/p right TKA on 8/15.  PMH includes atrial fibrillation on chronic warfarin anticoagulation.  His most recent warfarin dose was 5mg  daily except 2.5mg  on Mon, Wed, Friday with his last dose taken on 8/10.  He was then bridged with full dose Lovenox 60mg  BID.  Pharmacy is consulted to resume warfarin.  Today, 03/25/2015:   INR 1.05  CBC: Hgb 14.1 and Plt 232 (preop baseline 8/9)  Diet: Regular  Drug-drug interactions: none   Lovenox 60mg  SQ BID per MD  Goal of Therapy:  INR 2-3 Monitor platelets by anticoagulation protocol: Yes   Plan:   Warfarin 5mg  PO tonight at 2000  Daily INR and CBC  Lovenox per MD.  Gretta Arab PharmD, BCPS Pager [phone] 03/25/2015 1:15 PM

## 2015-03-25 NOTE — Anesthesia Procedure Notes (Signed)
Spinal Patient location during procedure: OR Start time: 03/25/2015 9:45 AM End time: 03/25/2015 9:52 AM Staffing Resident/CRNA: Dion Saucier E Performed by: resident/CRNA  Preanesthetic Checklist Completed: patient identified, site marked, surgical consent, pre-op evaluation, timeout performed, IV checked, risks and benefits discussed and monitors and equipment checked Spinal Block Patient position: sitting Prep: Betadine and site prepped and draped Patient monitoring: heart rate, blood pressure and continuous pulse ox Approach: right paramedian Location: L3-4 Injection technique: single-shot Needle Needle type: Spinocan  Needle gauge: 22 G Needle length: 9 cm Additional Notes Kit expiration date checked, negative heme, paresthesia, good flow tolerated well.

## 2015-03-25 NOTE — Op Note (Signed)
NAME:  [name]                      MEDICAL RECORD NO.:  [id number]                             FACILITY:  [facility]      PHYSICIAN:  Pietro Cassis. Alvan Dame, M.D.  DATE OF BIRTH:  Jul 13, 1941      DATE OF PROCEDURE:  03/25/2015                                     OPERATIVE REPORT         PREOPERATIVE DIAGNOSIS:  Right knee osteoarthritis.      POSTOPERATIVE DIAGNOSIS:  Right knee osteoarthritis.      FINDINGS:  The patient was noted to have complete loss of cartilage and   bone-on-bone arthritis with associated osteophytes in the medial and patellofemoral compartments of   the knee with a significant synovitis and associated effusion.      PROCEDURE:  Right total knee replacement.      COMPONENTS USED:  DePuy Attune rotating platform posterior stabilized knee   system, a size 4 femur, 4 tibia, size 7 mm PS AOX insert, and 35 patellar   button.      SURGEON:  Pietro Cassis. Alvan Dame, M.D.      ASSISTANT:  Danae Orleans, PA-C.      ANESTHESIA:  Spinal.      SPECIMENS:  None.      COMPLICATION:  None.      DRAINS:  One Hemovac.  EBL: <50c      TOURNIQUET TIME:   Total Tourniquet Time Documented: Thigh (Right) - 27 minutes Total: Thigh (Right) - 27 minutes     The patient was stable to the recovery room.      INDICATION FOR PROCEDURE:  Jamie Coffey is a 74 y.o. female patient of   mine.  The patient had been seen, evaluated, and treated conservatively in the   office with medication, activity modification, and injections.  The patient had   radiographic changes of bone-on-bone arthritis with endplate sclerosis and osteophytes noted.      The patient failed conservative measures including medication, injections, and activity modification, and at this point was ready for more definitive measures.   Based on the radiographic changes and failed conservative measures, the patient   decided to proceed with total knee replacement.  Risks of infection,   DVT, component failure, need  for revision surgery, postop course, and   expectations were all   discussed and reviewed.  Consent was obtained for benefit of pain   relief.      PROCEDURE IN DETAIL:  The patient was brought to the operative theater.   Once adequate anesthesia, preoperative antibiotics, 2 gm of Ancef and 77m gof Decadron administered, the patient was positioned supine with the right thigh tourniquet placed.  The  right lower extremity was prepped and draped in sterile fashion.  A time-   out was performed identifying the patient, planned procedure, and   extremity.      The right lower extremity was placed in the [facility] leg holder.  The leg was   exsanguinated, tourniquet elevated to 250 mmHg.  A midline incision was   made followed by median parapatellar arthrotomy.  Following initial  exposure, attention was first directed to the patella.  Precut   measurement was noted to be 20 mm.  I resected down to 14 mm and used a   35 patellar button to restore patellar height as well as cover the cut   surface.      The lug holes were drilled and a metal shim was placed to protect the   patella from retractors and saw blades.      At this point, attention was now directed to the femur.  The femoral   canal was opened with a drill, irrigated to try to prevent fat emboli.  An   intramedullary rod was passed at 3 degrees valgus, 9 mm of bone was   resected off the distal femur.  Following this resection, the tibia was   subluxated anteriorly.  Using the extramedullary guide, 2 mm of bone was resected off   the proximal medial tibia.  We confirmed the gap would be   stable medially and laterally with a size 5 mm insert as well as confirmed   the cut was perpendicular in the coronal plane, checking with an alignment rod.      Once this was done, I sized the femur to be a size 4 in the anterior-   posterior dimension, chose a standard component based on medial and   lateral dimension.  The size 4 rotation  block was then pinned in   position anterior referenced using the C-clamp to set rotation.  The   anterior, posterior, and  chamfer cuts were made without difficulty nor   notching making certain that I was along the anterior cortex to help   with flexion gap stability.      The final box cut was made off the lateral aspect of distal femur.      At this point, the tibia was sized to be a size 4, the size 4 tray was   then pinned in position through the medial third of the tubercle,   drilled, and keel punched.  Trial reduction was now carried with a 4 femur,  4 tibia, a size 6 then 7 mm insert, and the 35 patella botton.  The knee was brought to   extension, full extension with good flexion stability with the patella   tracking through the trochlea without application of pressure.  Given   all these findings, the trial components removed.  Final components were   opened and cement was mixed.  The knee was irrigated with normal saline   solution and pulse lavage.  The synovial lining was   then injected with 30cc of 0.25% Marcaine with epinephrine and 1 cc of Toradol plus 30cc of NS for a    total of 61 cc.      The knee was irrigated.  Final implants were then cemented onto clean and   dried cut surfaces of bone with the knee brought to extension with a size 7 mm trial insert.      Once the cement had fully cured, the excess cement was removed   throughout the knee.  I confirmed I was satisfied with the range of   motion and stability, and the final size 7 mm PS AOX insert was chosen.  It was   placed into the knee.      The tourniquet had been let down at 27 minutes.  No significant   hemostasis required.  The   extensor mechanism was then reapproximated using #1  Vicryl with the knee   in flexion.  Following closure of the extensor mechanism I injected the intra-articular space with 2gm of topical Tranexamic Acid.  The   remaining wound was closed with 2-0 Vicryl and running 4-0  Monocryl.   The knee was cleaned, dried, dressed sterilely using Dermabond and   Aquacel dressing.  The patient was then   brought to recovery room in stable condition, tolerating the procedure   well.   Please note that Physician Assistant, Danae Orleans, PA-C, was present for the entirety of the case, and was utilized for pre-operative positioning, peri-operative retractor management, general facilitation of the procedure.  He was also utilized for primary wound closure at the end of the case.              Pietro Cassis Alvan Dame, M.D.    03/25/2015 11:06 AM

## 2015-03-26 ENCOUNTER — Encounter (HOSPITAL_COMMUNITY): Payer: Self-pay | Admitting: Orthopedic Surgery

## 2015-03-26 LAB — BASIC METABOLIC PANEL
ANION GAP: 4 — AB (ref 5–15)
BUN: 12 mg/dL (ref 6–20)
CALCIUM: 8.1 mg/dL — AB (ref 8.9–10.3)
CO2: 27 mmol/L (ref 22–32)
CREATININE: 0.56 mg/dL (ref 0.44–1.00)
Chloride: 109 mmol/L (ref 101–111)
GFR calc Af Amer: >60 mL/min (ref >60)
GLUCOSE: 116 mg/dL — AB (ref 65–99)
Potassium: 4.4 mmol/L (ref 3.5–5.1)
Sodium: 140 mmol/L (ref 135–145)

## 2015-03-26 LAB — CBC
HEMATOCRIT: 33.4 % — AB (ref 36.0–46.0)
Hemoglobin: 10.9 g/dL — ABNORMAL LOW (ref 12.0–15.0)
MCH: 29.9 pg (ref 26.0–34.0)
MCHC: 32.6 g/dL (ref 30.0–36.0)
MCV: 91.5 fL (ref 78.0–100.0)
PLATELETS: 183 10*3/uL (ref 150–400)
RBC: 3.65 MIL/uL — ABNORMAL LOW (ref 3.87–5.11)
RDW: 13.1 % (ref 11.5–15.5)
WBC: 8.9 10*3/uL (ref 4.0–10.5)

## 2015-03-26 LAB — PROTIME-INR
INR: 1.09 (ref 0.00–1.49)
Prothrombin Time: 14.3 seconds (ref 11.6–15.2)

## 2015-03-26 MED ORDER — FERROUS SULFATE 325 (65 FE) MG PO TABS: 325.0000 mg | ORAL_TABLET | Freq: Three times a day (TID) | ORAL | Status: DC

## 2015-03-26 MED ORDER — HYDROCODONE-ACETAMINOPHEN 7.5-325 MG PO TABS: 1.0000 | ORAL_TABLET | ORAL | Status: DC | PRN

## 2015-03-26 MED ORDER — DOCUSATE SODIUM 100 MG PO CAPS: 100.0000 mg | ORAL_CAPSULE | Freq: Two times a day (BID) | ORAL | Status: DC

## 2015-03-26 MED ORDER — TIZANIDINE HCL 4 MG PO TABS: 4.0000 mg | ORAL_TABLET | Freq: Four times a day (QID) | ORAL | Status: DC | PRN

## 2015-03-26 MED ORDER — ENOXAPARIN SODIUM 60 MG/0.6ML ~~LOC~~ SOLN: 60.0000 mg | Freq: Two times a day (BID) | SUBCUTANEOUS | Status: DC

## 2015-03-26 MED ORDER — WARFARIN SODIUM 5 MG PO TABS
5.0000 mg | ORAL_TABLET | Freq: Once | ORAL | Status: DC
  Filled 2015-03-26: qty 1

## 2015-03-26 MED ORDER — POLYETHYLENE GLYCOL 3350 17 G PO PACK: 17.0000 g | PACK | Freq: Two times a day (BID) | ORAL | Status: DC

## 2015-03-26 NOTE — Care Management Note (Addendum)
Case Management Note  Patient Details  Name: Jamie Coffey MRN: [id number] Date of Birth: 06-17-1941  Subjective/Objective:                   RIGHT TOTAL KNEE ARTHROPLASTY (Right) Action/Plan:  Discharge planning Expected Discharge Date:  03/26/15               Expected Discharge Plan:  [place]  In-House Referral:     Discharge planning Services  CM Consult  Post Acute Care Choice:  Home Health Choice offered to:  Patient  DME Arranged:  3-N-1, Walker rolling DME Agency:  [place]:  PT [place] Agency:  [place]  Status of Service:  Completed, signed off  Medicare Important Message Given:    Date Medicare IM Given:    Medicare IM give by:    Date Additional Medicare IM Given:    Additional Medicare Important Message give by:     If discussed at [place] of Stay Meetings, dates discussed:    Additional Comments: CM met with pt and spouse in room to offer choice of home health agency.  Pt chooses [facility] to render HHPT/RN (for INR draws).  Address and contact information verified by pt.  Referral given to [employer], Tim.  CM called [facility] rep, Lecretia to please deliver the rolling walker and 3n1 to room prior to discharge.  No other CM needs were communicated. Dellie Catholic, RN 03/26/2015, 11:19 AM

## 2015-03-26 NOTE — Evaluation (Signed)
Occupational Therapy Evaluation Patient Details Name: Jamie Coffey MRN: [id number] DOB: Jan 04, 1941 Today's Date: 03/26/2015    History of Present Illness R TKR   Clinical Impression   Pt doing well. Overall min assist with most ADL except mod assist LB self care and pt states spouse can assist with this. Will follow on acute to progress ADL independence.      Follow Up Recommendations  No OT follow up;Supervision/Assistance - 24 hour    Equipment Recommendations  3 in 1 bedside comode    Recommendations for Other Services       Precautions / Restrictions Precautions Precautions: Knee;Fall Restrictions Weight Bearing Restrictions: No Other Position/Activity Restrictions: WBAT      Mobility Bed Mobility               General bed mobility comments: in chair.   Transfers Overall transfer level: Needs assistance Equipment used: Rolling walker (2 wheeled) Transfers: Sit to/from Stand Sit to Stand: Min assist;Min guard         General transfer comment: verbal cues for hand placement and LE management.    Balance                                            ADL Overall ADL's : Needs assistance/impaired Eating/Feeding: Independent;Sitting   Grooming: Wash/dry hands;Min guard;Standing   Upper Body Bathing: Set up;Sitting   Lower Body Bathing: Minimal assistance;Sit to/from stand   Upper Body Dressing : Set up;Sitting   Lower Body Dressing: Moderate assistance;Sit to/from stand   Toilet Transfer: Minimal assistance;Ambulation;BSC;RW   Toileting- Clothing Manipulation and Hygiene: Minimal assistance;Sit to/from stand         General ADL Comments: Pt is familiar with the AE from her husband's previous surgery. Educated on all AE items and coverage if pt decides she would like AE but she states he can assist also. Demonstrated shower transfer technique and discussed use of 3in1 as shower chair and to start out showering sitting down.  Pt verbalizes understanding of how to transfer in and out of shower and declines need to practice. Pt did well standing to wash periareas in bathroom.      Vision     Perception     Praxis      Pertinent Vitals/Pain Pain Assessment: 0-10 Pain Score: 7  Pain Location: R knee Pain Descriptors / Indicators: Aching Pain Intervention(s): Repositioned;Ice applied     Hand Dominance     Extremity/Trunk Assessment Upper Extremity Assessment Upper Extremity Assessment: Overall WFL for tasks assessed           Communication Communication Communication: No difficulties   Cognition Arousal/Alertness: Awake/alert Behavior During Therapy: WFL for tasks assessed/performed Overall Cognitive Status: Within Functional Limits for tasks assessed                     General Comments       Exercises       Shoulder Instructions      Home Living Family/patient expects to be discharged to:: Private residence Living Arrangements: Spouse/significant other Available Help at Discharge: Family Type of Home: House Home Access: Stairs to enter Technical brewer of Steps: 1   Home Layout: One level     Bathroom Shower/Tub: Occupational psychologist: Handicapped height     Home Equipment: Environmental consultant - standard;Adaptive equipment   Additional Comments: Pt's  husband is checking on condition of walker      Prior Functioning/Environment Level of Independence: Independent             OT Diagnosis: Generalized weakness   OT Problem List: Decreased strength;Decreased knowledge of use of DME or AE   OT Treatment/Interventions: Self-care/ADL training;Patient/family education;Therapeutic activities;DME and/or AE instruction    OT Goals(Current goals can be found in the care plan section) Acute Rehab OT Goals Patient Stated Goal: return to independence.  OT Goal Formulation: With patient/family Time For Goal Achievement: 04/02/15 Potential to Achieve Goals: Good   OT Frequency: Min 2X/week   Barriers to D/C:            Co-evaluation              End of Session Equipment Utilized During Treatment: Rolling walker  Activity Tolerance: Patient tolerated treatment well Patient left: in chair;with family/visitor present;with call bell/phone within reach   Time: 1000-1035 OT Time Calculation (min): 35 min Charges:  OT General Charges $OT Visit: 1 Procedure OT Evaluation $Initial OT Evaluation Tier I: 1 Procedure OT Treatments $Therapeutic Activity: 8-22 mins G-Codes:    Jules Schick  [phone] 03/26/2015, 12:37 PM

## 2015-03-26 NOTE — Progress Notes (Signed)
Physical Therapy Treatment Patient Details Name: Jamie Coffey MRN: [id number] DOB: 30-Jul-1941 Today's Date: 03/26/2015    History of Present Illness R TKR    PT Comments    POD # 1 am session.  Assisted OOB to amb to BR then in hallway an increased distance.  Returned to room then performed TKR TE's followed by ICE.    Follow Up Recommendations  Home health PT     Equipment Recommendations  None recommended by PT    Recommendations for Other Services       Precautions / Restrictions Precautions Precautions: Knee;Fall Restrictions Weight Bearing Restrictions: No Other Position/Activity Restrictions: WBAT    Mobility  Bed Mobility Overal bed mobility: Needs Assistance Bed Mobility: Supine to Sit     Supine to sit: Min assist     General bed mobility comments: R LE  Transfers Overall transfer level: Needs assistance Equipment used: Rolling walker (2 wheeled) Transfers: Sit to/from Stand Sit to Stand: Min guard;Min assist         General transfer comment: verbal cues for hand placement and LE management.  Ambulation/Gait Ambulation/Gait assistance: Min guard;Min assist Ambulation Distance (Feet): 45 Feet Assistive device: Rolling walker (2 wheeled) Gait Pattern/deviations: Step-to pattern;Decreased stance time - right;Trunk flexed     General Gait Details: cues for sequence, posture and position from [employer]            Wheelchair Mobility    Modified Rankin (Stroke Patients Only)       Balance                                    Cognition Arousal/Alertness: Awake/alert Behavior During Therapy: WFL for tasks assessed/performed Overall Cognitive Status: Within Functional Limits for tasks assessed                      Exercises   Total Knee Replacement TE's 10 reps B LE ankle pumps 10 reps towel squeezes 10 reps knee presses 10 reps heel slides  10 reps SAQ's 10 reps SLR's 10 reps ABD Followed by  ICE     General Comments        Pertinent Vitals/Pain Pain Assessment: 0-10 Pain Score: 5  Pain Location: R knee Pain Descriptors / Indicators: Aching;Sore Pain Intervention(s): Monitored during session;Premedicated before session;Repositioned;Ice applied    Home Living Family/patient expects to be discharged to:: Private residence Living Arrangements: Spouse/significant other Available Help at Discharge: Family Type of Home: House Home Access: Stairs to enter   Home Layout: One level Home Equipment: Walker - standard;Adaptive equipment Additional Comments: Pt's husband is checking on condition of walker    Prior Function Level of Independence: Independent          PT Goals (current goals can now be found in the care plan section) Acute Rehab PT Goals Patient Stated Goal: return to independence.  Progress towards PT goals: Progressing toward goals    Frequency  7X/week    PT Plan      Co-evaluation             End of Session Equipment Utilized During Treatment: Gait belt Activity Tolerance: Patient tolerated treatment well Patient left: in chair;with call bell/phone within reach     Time: 0845-0910 PT Time Calculation (min) (ACUTE ONLY): 25 min  Charges:  $Gait Training: 8-22 mins $Therapeutic Exercise: 8-22 mins  G Codes:      Rica Koyanagi  PTA [facility]  Acute  Rehab Pager      [phone]

## 2015-03-26 NOTE — Progress Notes (Signed)
Patient ID: Jamie Coffey, female   DOB: June 25, 1941, 74 y.o.   MRN: [id number] Subjective: 1 Day Post-Op Procedure(s) (LRB): RIGHT TOTAL KNEE ARTHROPLASTY (Right)    Patient reports pain as mild to moderate, no events last night, getting up to chair this am after breakfast waiting for therapy  Objective:   VITALS:   Filed Vitals:   03/26/15 1329  BP: 138/59  Pulse: 58  Temp: 98.4 F (36.9 C)  Resp: 18    Neurovascular intact Incision: dressing C/D/I  LABS  Recent Labs  03/26/15 0535  HGB 10.9*  HCT 33.4*  WBC 8.9  PLT 183     Recent Labs  03/26/15 0535  NA 140  K 4.4  BUN 12  CREATININE 0.56  GLUCOSE 116*     Recent Labs  03/25/15 0750 03/26/15 0535  INR 1.05 1.09     Assessment/Plan: 1 Day Post-Op Procedure(s) (LRB): RIGHT TOTAL KNEE ARTHROPLASTY (Right)   Advance diet Up with therapy Discharge home with home health today or tomorrow Resumed coumadin for chronic a-fib prophylaxis

## 2015-03-26 NOTE — Progress Notes (Signed)
ANTICOAGULATION CONSULT NOTE - Follow up  Pharmacy Consult for Warfarin Indication: atrial fibrillation and VTE prophylaxis  No Known Allergies  Patient Measurements: Height: 5' 5.5" (166.4 cm) Weight: 151 lb (68.493 kg) IBW/kg (Calculated) : 58.15  Vital Signs: Temp: 98.3 F (36.8 C) (08/16 1100) Temp Source: Oral (08/16 1100) BP: 147/65 mmHg (08/16 0937) Pulse Rate: 53 (08/16 1100)  Labs:  Recent Labs  03/25/15 0750 03/26/15 0535  HGB  --  10.9*  HCT  --  33.4*  PLT  --  183  LABPROT 13.9 14.3  INR 1.05 1.09  CREATININE  --  0.56    Estimated Creatinine Clearance: 58.4 mL/min (by C-G formula based on Cr of 0.56).   Medical History: Past Medical History  Diagnosis Date  . PAF (paroxysmal atrial fibrillation)     a. 05/2011 Echo: EF 70%, triv MR, mild AI, mod TR, Gr 1 DD.  . Factor 5 Leiden mutation, heterozygous     a. chronic xarelto.  . Chest pain, unspecified     a. 12/2013 MV: EF 65%, no ischemia/infarct.  . Mitral valve prolapse   . Dysphagia   . GERD (gastroesophageal reflux disease)   . History of DVT & PE ~ 2000    S/P RLE knee scope  . Hypertension   . Breast cancer 2010    "left", s/p XRT  . Hypothyroidism   . H/O hiatal hernia   . Arthritis     "joints" (01/18/2014)  . Sleep deprivation     "for years and years" (01/18/2014)  . Family history of adverse reaction to anesthesia     mother developed irreg heart rate during surgery  . History of kidney stones   . Difficulty sleeping     dificulty staying asleep  . Depression   . Blood dyscrasia     factor 5 Leiden  . Sleep terrors     Medications:  Scheduled:  . amLODipine  2.5 mg Oral Daily  . celecoxib  200 mg Oral Q12H  . docusate sodium  100 mg Oral BID  . enoxaparin  60 mg Subcutaneous Q12H  . ferrous sulfate  325 mg Oral TID PC  . HYDROcodone-acetaminophen  1-2 tablet Oral Q4H  . levothyroxine  75 mcg Oral QAC breakfast  . nebivolol  2.5 mg Oral Daily  . polyethylene glycol   17 g Oral BID  . venlafaxine XR  75 mg Oral Q breakfast  . Warfarin - Pharmacist Dosing Inpatient   Does not apply q1800   Infusions:  . sodium chloride 0.9 % 1,000 mL with potassium chloride 10 mEq infusion 30 mL/hr at 03/26/15 5916    Assessment: 67 yoF admitted on 8/15 with right knee osteoarthritis, s/p right TKA on 8/15.  PMH includes atrial fibrillation on chronic warfarin anticoagulation.  His most recent warfarin dose was 5mg  daily except 2.5mg  on Mon, Wed, Friday with his last dose taken on 8/10.  He was then bridged with full dose Lovenox 60mg  BID.  Pharmacy is consulted to resume warfarin.  Today, 03/26/2015:   INR subtherapeutic but up slightly after 5mg  x 1.  H/H down after surgery as expected. Pltc wnl. No bleeding reported/documented.  Diet: Regular, tolerating.  Drug-drug interactions: none   Lovenox 60mg  SQ BID per MD  Goal of Therapy:  INR 2-3 Monitor platelets by anticoagulation protocol: Yes   Plan:   Repeat warfarin 5mg  PO tonight at 1800.  Daily INR and CBC  Lovenox per MD.  Romeo Rabon, PharmD, pager  [phone]. 03/26/2015,1:25 PM.

## 2015-03-26 NOTE — Discharge Instructions (Signed)

## 2015-03-26 NOTE — Progress Notes (Signed)
Physical Therapy Treatment Patient Details Name: NATEISHA MOYD MRN: [id number] DOB: 1940/11/08 Today's Date: 03/26/2015    History of Present Illness R TKR    PT Comments    POD # 1 practiced stairs with spouse.  Pt ready for D/C to home.   Follow Up Recommendations  Home health PT     Equipment Recommendations  None recommended by PT    Recommendations for Other Services       Precautions / Restrictions Precautions Precautions: Knee;Fall Restrictions Weight Bearing Restrictions: No Other Position/Activity Restrictions: WBAT    Mobility  Bed Mobility Overal bed mobility: Needs Assistance Bed Mobility: Supine to Sit     Supine to sit: Min assist     General bed mobility comments: R LE  Transfers Overall transfer level: Needs assistance Equipment used: Rolling walker (2 wheeled) Transfers: Sit to/from Stand Sit to Stand: Min guard;Min assist         General transfer comment: verbal cues for hand placement and LE management.  Ambulation/Gait Ambulation/Gait assistance: Supervision;Min guard Ambulation Distance (Feet): 55 Feet Assistive device: Rolling walker (2 wheeled) Gait Pattern/deviations: Step-to pattern;Decreased stance time - right     General Gait Details: cues for sequence, posture and position from RW   Stairs Stairs: Yes Stairs assistance: Min guard Stair Management: No rails;Step to pattern;Forwards;With walker Number of Stairs: 2 General stair comments: with spouse and 25% VC's on proper sequencing and walker placement  Wheelchair Mobility    Modified Rankin (Stroke Patients Only)       Balance                                    Cognition Arousal/Alertness: Awake/alert Behavior During Therapy: WFL for tasks assessed/performed Overall Cognitive Status: Within Functional Limits for tasks assessed                      Exercises      General Comments        Pertinent Vitals/Pain Pain  Assessment: 0-10 Pain Score: 5  Pain Location: R knee Pain Descriptors / Indicators: Aching;Sore Pain Intervention(s): Monitored during session;Premedicated before session;Repositioned;Ice applied    Home Living                      Prior Function            PT Goals (current goals can now be found in the care plan section) Progress towards PT goals: Progressing toward goals    Frequency  7X/week    PT Plan      Co-evaluation             End of Session Equipment Utilized During Treatment: Gait belt Activity Tolerance: Patient tolerated treatment well Patient left: in chair;with call bell/phone within reach     Time: 1510-1525 PT Time Calculation (min) (ACUTE ONLY): 15 min  Charges:  $Gait Training: 8-22 mins                    G Codes:      Rica Koyanagi  PTA [facility]  Acute  Rehab Pager      [phone]

## 2015-03-26 NOTE — Progress Notes (Signed)
Physical Therapy Treatment Patient Details Name: Jamie Coffey MRN: [id number] DOB: 07-26-1941 Today's Date: 03/26/2015    History of Present Illness R TKR    PT Comments    POD # 1 pm session.  Assisted with amb a greater distance in hallway.  Will return later to practice stairs when spouse arrives.    Follow Up Recommendations  Home health PT     Equipment Recommendations  None recommended by PT    Recommendations for Other Services       Precautions / Restrictions Precautions Precautions: Knee;Fall Restrictions Weight Bearing Restrictions: No Other Position/Activity Restrictions: WBAT    Mobility  Bed Mobility Overal bed mobility: Needs Assistance Bed Mobility: Supine to Sit     Supine to sit: Min assist     General bed mobility comments: R LE  Transfers Overall transfer level: Needs assistance Equipment used: Rolling walker (2 wheeled) Transfers: Sit to/from Stand Sit to Stand: Min guard;Min assist         General transfer comment: verbal cues for hand placement and LE management.  Ambulation/Gait Ambulation/Gait assistance: Supervision;Min guard Ambulation Distance (Feet): 55 Feet Assistive device: Rolling walker (2 wheeled) Gait Pattern/deviations: Step-to pattern;Decreased stance time - right     General Gait Details: cues for sequence, posture and position from RW    Wheelchair Mobility    Modified Rankin (Stroke Patients Only)       Balance                                    Cognition Arousal/Alertness: Awake/alert Behavior During Therapy: WFL for tasks assessed/performed Overall Cognitive Status: Within Functional Limits for tasks assessed                      Exercises      General Comments        Pertinent Vitals/Pain Pain Assessment: 0-10 Pain Score: 5  Pain Location: R knee Pain Descriptors / Indicators: Aching;Sore Pain Intervention(s): Monitored during session;Premedicated before  session;Repositioned;Ice applied    Home Living                      Prior Function            PT Goals (current goals can now be found in the care plan section) Progress towards PT goals: Progressing toward goals    Frequency  7X/week    PT Plan      Co-evaluation             End of Session Equipment Utilized During Treatment: Gait belt Activity Tolerance: Patient tolerated treatment well Patient left: in chair;with call bell/phone within reach     Time: 1420-1440 PT Time Calculation (min) (ACUTE ONLY): 20 min  Charges:  $Gait Training: 8-22 mins                    G Codes:      Rica Koyanagi  PTA [facility]  Acute  Rehab Pager      [phone]

## 2015-03-27 DIAGNOSIS — I10 Essential (primary) hypertension: Secondary | ICD-10-CM | POA: Diagnosis not present

## 2015-03-27 DIAGNOSIS — Z471 Aftercare following joint replacement surgery: Secondary | ICD-10-CM | POA: Diagnosis not present

## 2015-03-27 DIAGNOSIS — F329 Major depressive disorder, single episode, unspecified: Secondary | ICD-10-CM | POA: Diagnosis not present

## 2015-03-27 DIAGNOSIS — I480 Paroxysmal atrial fibrillation: Secondary | ICD-10-CM | POA: Diagnosis not present

## 2015-03-27 DIAGNOSIS — D688 Other specified coagulation defects: Secondary | ICD-10-CM | POA: Diagnosis not present

## 2015-03-27 DIAGNOSIS — M1990 Unspecified osteoarthritis, unspecified site: Secondary | ICD-10-CM | POA: Diagnosis not present

## 2015-03-28 ENCOUNTER — Other Ambulatory Visit: Payer: Self-pay | Admitting: Cardiology

## 2015-03-29 DIAGNOSIS — I480 Paroxysmal atrial fibrillation: Secondary | ICD-10-CM | POA: Diagnosis not present

## 2015-03-29 DIAGNOSIS — M1990 Unspecified osteoarthritis, unspecified site: Secondary | ICD-10-CM | POA: Diagnosis not present

## 2015-03-29 DIAGNOSIS — D688 Other specified coagulation defects: Secondary | ICD-10-CM | POA: Diagnosis not present

## 2015-03-29 DIAGNOSIS — I10 Essential (primary) hypertension: Secondary | ICD-10-CM | POA: Diagnosis not present

## 2015-03-29 DIAGNOSIS — F329 Major depressive disorder, single episode, unspecified: Secondary | ICD-10-CM | POA: Diagnosis not present

## 2015-03-29 DIAGNOSIS — Z471 Aftercare following joint replacement surgery: Secondary | ICD-10-CM | POA: Diagnosis not present

## 2015-03-29 NOTE — Discharge Summary (Signed)
Physician Discharge Summary  Patient ID: Jamie Coffey MRN: [id number] DOB/AGE: Jun 07, 1941 74 y.o.  Admit date: 03/25/2015 Discharge date: 03/26/2015   Procedures:  Procedure(s) (LRB): RIGHT TOTAL KNEE ARTHROPLASTY (Right)  Attending Physician:  Dr. Paralee Cancel   Admission Diagnoses:   Right knee primary OA / pain  Discharge Diagnoses:  Principal Problem:   S/P right TKA Active Problems:   S/P knee replacement  Past Medical History  Diagnosis Date  . PAF (paroxysmal atrial fibrillation)     a. 05/2011 Echo: EF 70%, triv MR, mild AI, mod TR, Gr 1 DD.  . Factor 5 Leiden mutation, heterozygous     a. chronic xarelto.  . Chest pain, unspecified     a. 12/2013 MV: EF 65%, no ischemia/infarct.  . Mitral valve prolapse   . Dysphagia   . GERD (gastroesophageal reflux disease)   . History of DVT & PE ~ 2000    S/P RLE knee scope  . Hypertension   . Breast cancer 2010    "left", s/p XRT  . Hypothyroidism   . H/O hiatal hernia   . Arthritis     "joints" (01/18/2014)  . Sleep deprivation     "for years and years" (01/18/2014)  . Family history of adverse reaction to anesthesia     mother developed irreg heart rate during surgery  . History of kidney stones   . Difficulty sleeping     dificulty staying asleep  . Depression   . Blood dyscrasia     factor 5 Leiden  . Sleep terrors     HPI:    Jamie Coffey, 74 y.o. female, has a history of pain and functional disability in the right knee due to arthritis and has failed non-surgical conservative treatments for greater than 12 weeks to includeNSAID's and/or analgesics, corticosteriod injections, viscosupplementation injections and activity modification. Onset of symptoms was gradual, starting 3+ years ago with gradually worsening course since that time. The patient noted prior procedures on the knee to include arthroscopy and menisectomy on the right knee(s). Patient currently rates pain in the right knee(s) at 9 out of 10  with activity. Patient has worsening of pain with activity and weight bearing, pain that interferes with activities of daily living, pain with passive range of motion, crepitus and joint swelling. Patient has evidence of periarticular osteophytes and joint space narrowing by imaging studies. There is no active infection. Risks, benefits and expectations were discussed with the patient. Risks including but not limited to the risk of anesthesia, blood clots, nerve damage, blood vessel damage, failure of the prosthesis, infection and up to and including death. Patient understand the risks, benefits and expectations and wishes to proceed with surgery.   PCP: [name], MD   Discharged Condition: good  Hospital Course:  Patient underwent the above stated procedure on 03/25/2015. Patient tolerated the procedure well and brought to the recovery room in good condition and subsequently to the floor.  POD #1 BP: 138/59 ; Pulse: 58 ; Temp: 98.4 F (36.9 C) ; Resp: 18 Patient reports pain as mild to moderate, no events last night, getting up to chair this am after breakfast waiting for therapy.  Ready to be discharged home. Dorsiflexion/plantar flexion intact, incision: dressing C/D/I, no cellulitis present and compartment soft.   LABS  Basename    HGB  10.9  HCT  33.4    Discharge Exam: General appearance: alert, cooperative and no distress Extremities: Homans sign is negative, no sign of DVT,  no edema, redness or tenderness in the calves or thighs and no ulcers, gangrene or trophic changes  Disposition: Home with follow up in 2 weeks   Follow-up Information    Follow up with [facility].   Why:  home health physical therapy and nurse for INR draws   Contact information:   [address] [address] [phone]       Follow up with [facility].   Why:  rolling walker and 3n1   Contact information:   [place] [address] [phone]       Follow up with Mauri Pole, MD. Schedule an appointment as soon as possible for a visit in 2 weeks.   Specialty:  Orthopedic Surgery   Contact information:   [address] [place] [address] [phone]       Discharge Instructions    Call MD / Call 911    Complete by:  As directed   If you experience chest pain or shortness of breath, CALL 911 and be transported to the hospital emergency room.  If you develope a fever above 101 F, pus (white drainage) or increased drainage or redness at the wound, or calf pain, call your surgeon's office.     Change dressing    Complete by:  As directed   Maintain surgical dressing until follow up in the clinic. If the edges start to pull up, may reinforce with tape. If the dressing is no longer working, may remove and cover with gauze and tape, but must keep the area dry and clean.  Call with any questions or concerns.     Constipation Prevention    Complete by:  As directed   Drink plenty of fluids.  Prune juice may be helpful.  You may use a stool softener, such as Colace (over the counter) 100 mg twice a day.  Use MiraLax (over the counter) for constipation as needed.     Diet - low sodium heart healthy    Complete by:  As directed      Discharge instructions    Complete by:  As directed   Maintain surgical dressing until follow up in the clinic. If the edges start to pull up, may reinforce with tape. If the dressing is no longer working, may remove and cover with gauze and tape, but must keep the area dry and clean.  Follow up in 2 weeks at [facility]. Call with any questions or concerns.     Increase activity slowly as tolerated    Complete by:  As directed   Weight bearing as tolerated with assist device (walker, cane, etc) as directed, use it as long as suggested by your surgeon or therapist, typically at least 4-6 weeks.     TED hose    Complete by:  As directed   Use  stockings (TED hose) for 2 weeks on both leg(s).  You may remove them at night for sleeping.             Medication List    STOP taking these medications        warfarin 5 MG tablet  Commonly known as:  COUMADIN      TAKE these medications        amLODipine 2.5 MG tablet  Commonly known as:  NORVASC  TAKE 1 TABLET BY MOUTH ONCE DAILY     celecoxib 200 MG capsule  Commonly  known as:  CELEBREX  Take 200 mg by mouth daily. Monday through Friday. Does not take on the weekend     desvenlafaxine 50 MG 24 hr tablet  Commonly known as:  PRISTIQ  Take 50 mg by mouth daily.     docusate sodium 100 MG capsule  Commonly known as:  COLACE  Take 1 capsule (100 mg total) by mouth 2 (two) times daily.     enoxaparin 60 MG/0.6ML injection  Commonly known as:  LOVENOX  Inject 0.6 mLs (60 mg total) into the skin every 12 (twelve) hours.     ferrous sulfate 325 (65 FE) MG tablet  Take 1 tablet (325 mg total) by mouth 3 (three) times daily after meals.     HYDROcodone-acetaminophen 7.5-325 MG per tablet  Commonly known as:  NORCO  Take 1-2 tablets by mouth every 4 (four) hours as needed for moderate pain.     levothyroxine 75 MCG tablet  Commonly known as:  SYNTHROID, LEVOTHROID  Take 75 mcg by mouth daily before breakfast.     nebivolol 2.5 MG tablet  Commonly known as:  BYSTOLIC  Take 1 tablet (2.5 mg total) by mouth daily.     polyethylene glycol packet  Commonly known as:  MIRALAX / GLYCOLAX  Take 17 g by mouth 2 (two) times daily.     tiZANidine 4 MG tablet  Commonly known as:  ZANAFLEX  Take 1 tablet (4 mg total) by mouth every 6 (six) hours as needed for muscle spasms.     zolpidem 10 MG tablet  Commonly known as:  AMBIEN  Take 10 mg by mouth at bedtime as needed for sleep.         Signed: West Pugh. Ketzaly Cardella   PA-C  03/29/2015, 8:39 AM

## 2015-04-01 ENCOUNTER — Ambulatory Visit (INDEPENDENT_AMBULATORY_CARE_PROVIDER_SITE_OTHER): Payer: Medicare Other | Admitting: Pharmacist

## 2015-04-01 ENCOUNTER — Telehealth: Payer: Self-pay | Admitting: Cardiology

## 2015-04-01 DIAGNOSIS — Z86711 Personal history of pulmonary embolism: Secondary | ICD-10-CM

## 2015-04-01 DIAGNOSIS — I10 Essential (primary) hypertension: Secondary | ICD-10-CM | POA: Diagnosis not present

## 2015-04-01 DIAGNOSIS — Z7901 Long term (current) use of anticoagulants: Secondary | ICD-10-CM | POA: Diagnosis not present

## 2015-04-01 DIAGNOSIS — Z5181 Encounter for therapeutic drug level monitoring: Secondary | ICD-10-CM

## 2015-04-01 DIAGNOSIS — I480 Paroxysmal atrial fibrillation: Secondary | ICD-10-CM | POA: Diagnosis not present

## 2015-04-01 DIAGNOSIS — D688 Other specified coagulation defects: Secondary | ICD-10-CM | POA: Diagnosis not present

## 2015-04-01 DIAGNOSIS — Z471 Aftercare following joint replacement surgery: Secondary | ICD-10-CM | POA: Diagnosis not present

## 2015-04-01 DIAGNOSIS — M1990 Unspecified osteoarthritis, unspecified site: Secondary | ICD-10-CM | POA: Diagnosis not present

## 2015-04-01 DIAGNOSIS — F329 Major depressive disorder, single episode, unspecified: Secondary | ICD-10-CM | POA: Diagnosis not present

## 2015-04-01 LAB — POCT INR: INR: 8

## 2015-04-01 LAB — PROTIME-INR: INR: 6.4 — AB (ref <=1.1)

## 2015-04-01 NOTE — Telephone Encounter (Signed)
INR 8 on finger stick per [place], will recheck stat with lab draw and hold coumadin.  They will recheck in AM as well. Pt without any bleeding.  If she has any bleeding she will go to ER.  I told this to the home health nurse.

## 2015-04-02 DIAGNOSIS — I480 Paroxysmal atrial fibrillation: Secondary | ICD-10-CM | POA: Diagnosis not present

## 2015-04-02 DIAGNOSIS — Z471 Aftercare following joint replacement surgery: Secondary | ICD-10-CM | POA: Diagnosis not present

## 2015-04-02 DIAGNOSIS — F329 Major depressive disorder, single episode, unspecified: Secondary | ICD-10-CM | POA: Diagnosis not present

## 2015-04-02 DIAGNOSIS — Z7901 Long term (current) use of anticoagulants: Secondary | ICD-10-CM | POA: Diagnosis not present

## 2015-04-02 DIAGNOSIS — M1990 Unspecified osteoarthritis, unspecified site: Secondary | ICD-10-CM | POA: Diagnosis not present

## 2015-04-02 DIAGNOSIS — I10 Essential (primary) hypertension: Secondary | ICD-10-CM | POA: Diagnosis not present

## 2015-04-02 DIAGNOSIS — D688 Other specified coagulation defects: Secondary | ICD-10-CM | POA: Diagnosis not present

## 2015-04-02 LAB — PROTIME-INR: INR: 5.4 — AB (ref <=1.1)

## 2015-04-03 ENCOUNTER — Observation Stay (HOSPITAL_COMMUNITY)
Admission: AD | Admit: 2015-04-03 | Discharge: 2015-04-05 | Disposition: A | Discharge status: Home / Self Care | Payer: Medicare Other | Source: Ambulatory Visit | Attending: Orthopedic Surgery | Admitting: Orthopedic Surgery

## 2015-04-03 ENCOUNTER — Encounter (HOSPITAL_COMMUNITY): Payer: Self-pay | Admitting: *Deleted

## 2015-04-03 ENCOUNTER — Other Ambulatory Visit: Payer: Self-pay | Admitting: Orthopedic Surgery

## 2015-04-03 ENCOUNTER — Ambulatory Visit: Payer: Self-pay | Admitting: Pharmacist

## 2015-04-03 ENCOUNTER — Telehealth: Payer: Self-pay

## 2015-04-03 DIAGNOSIS — I341 Nonrheumatic mitral (valve) prolapse: Secondary | ICD-10-CM | POA: Diagnosis not present

## 2015-04-03 DIAGNOSIS — M7989 Other specified soft tissue disorders: Secondary | ICD-10-CM | POA: Diagnosis not present

## 2015-04-03 DIAGNOSIS — Z79899 Other long term (current) drug therapy: Secondary | ICD-10-CM | POA: Insufficient documentation

## 2015-04-03 DIAGNOSIS — R4189 Other symptoms and signs involving cognitive functions and awareness: Secondary | ICD-10-CM

## 2015-04-03 DIAGNOSIS — Z90710 Acquired absence of both cervix and uterus: Secondary | ICD-10-CM | POA: Diagnosis not present

## 2015-04-03 DIAGNOSIS — Z87442 Personal history of urinary calculi: Secondary | ICD-10-CM | POA: Insufficient documentation

## 2015-04-03 DIAGNOSIS — I480 Paroxysmal atrial fibrillation: Secondary | ICD-10-CM | POA: Diagnosis not present

## 2015-04-03 DIAGNOSIS — Z86718 Personal history of other venous thrombosis and embolism: Secondary | ICD-10-CM | POA: Diagnosis not present

## 2015-04-03 DIAGNOSIS — I10 Essential (primary) hypertension: Secondary | ICD-10-CM | POA: Insufficient documentation

## 2015-04-03 DIAGNOSIS — E039 Hypothyroidism, unspecified: Secondary | ICD-10-CM | POA: Diagnosis not present

## 2015-04-03 DIAGNOSIS — Z72820 Sleep deprivation: Secondary | ICD-10-CM | POA: Diagnosis not present

## 2015-04-03 DIAGNOSIS — R4781 Slurred speech: Secondary | ICD-10-CM | POA: Diagnosis not present

## 2015-04-03 DIAGNOSIS — Z853 Personal history of malignant neoplasm of breast: Secondary | ICD-10-CM | POA: Insufficient documentation

## 2015-04-03 DIAGNOSIS — Z5181 Encounter for therapeutic drug level monitoring: Secondary | ICD-10-CM

## 2015-04-03 DIAGNOSIS — D688 Other specified coagulation defects: Secondary | ICD-10-CM | POA: Diagnosis not present

## 2015-04-03 DIAGNOSIS — D6851 Activated protein C resistance: Secondary | ICD-10-CM | POA: Insufficient documentation

## 2015-04-03 DIAGNOSIS — M1990 Unspecified osteoarthritis, unspecified site: Secondary | ICD-10-CM | POA: Diagnosis not present

## 2015-04-03 DIAGNOSIS — R55 Syncope and collapse: Secondary | ICD-10-CM | POA: Diagnosis not present

## 2015-04-03 DIAGNOSIS — D649 Anemia, unspecified: Principal | ICD-10-CM | POA: Diagnosis present

## 2015-04-03 DIAGNOSIS — Z471 Aftercare following joint replacement surgery: Secondary | ICD-10-CM | POA: Diagnosis not present

## 2015-04-03 DIAGNOSIS — K219 Gastro-esophageal reflux disease without esophagitis: Secondary | ICD-10-CM | POA: Insufficient documentation

## 2015-04-03 DIAGNOSIS — Z7901 Long term (current) use of anticoagulants: Secondary | ICD-10-CM | POA: Diagnosis not present

## 2015-04-03 DIAGNOSIS — F329 Major depressive disorder, single episode, unspecified: Secondary | ICD-10-CM | POA: Diagnosis not present

## 2015-04-03 DIAGNOSIS — Z96651 Presence of right artificial knee joint: Secondary | ICD-10-CM | POA: Diagnosis not present

## 2015-04-03 DIAGNOSIS — Z86711 Personal history of pulmonary embolism: Secondary | ICD-10-CM

## 2015-04-03 DIAGNOSIS — R404 Transient alteration of awareness: Secondary | ICD-10-CM | POA: Diagnosis not present

## 2015-04-03 LAB — PREPARE RBC (CROSSMATCH)

## 2015-04-03 MED ORDER — DOCUSATE SODIUM 100 MG PO CAPS
100.0000 mg | ORAL_CAPSULE | Freq: Two times a day (BID) | ORAL | Status: DC
  Administered 2015-04-04 – 2015-04-05 (×3): 100 mg via ORAL

## 2015-04-03 MED ORDER — ONDANSETRON HCL 4 MG PO TABS: 4.0000 mg | ORAL_TABLET | Freq: Four times a day (QID) | ORAL | Status: DC | PRN

## 2015-04-03 MED ORDER — BISACODYL 10 MG RE SUPP: 10.0000 mg | Freq: Every day | RECTAL | Status: DC | PRN

## 2015-04-03 MED ORDER — HYDROCODONE-ACETAMINOPHEN 5-325 MG PO TABS: 1.0000 | ORAL_TABLET | ORAL | Status: DC | PRN

## 2015-04-03 MED ORDER — SODIUM CHLORIDE 0.9 % IV SOLN: Freq: Once | INTRAVENOUS | Status: DC

## 2015-04-03 MED ORDER — ONDANSETRON HCL 4 MG/2ML IJ SOLN: 4.0000 mg | Freq: Four times a day (QID) | INTRAMUSCULAR | Status: DC | PRN

## 2015-04-03 MED ORDER — SODIUM CHLORIDE 0.9 % IV SOLN
INTRAVENOUS | Status: DC
  Administered 2015-04-03 – 2015-04-04 (×2): via INTRAVENOUS

## 2015-04-03 MED ORDER — FERROUS SULFATE 325 (65 FE) MG PO TABS
325.0000 mg | ORAL_TABLET | Freq: Three times a day (TID) | ORAL | Status: DC
  Administered 2015-04-04 – 2015-04-05 (×4): 325 mg via ORAL
  Filled 2015-04-03 (×7): qty 1

## 2015-04-03 MED ORDER — MAGNESIUM CITRATE PO SOLN: 1.0000 | Freq: Once | ORAL | Status: DC | PRN

## 2015-04-03 MED ORDER — POLYETHYLENE GLYCOL 3350 17 G PO PACK: 17.0000 g | PACK | Freq: Every day | ORAL | Status: DC | PRN

## 2015-04-03 MED ORDER — ALUM & MAG HYDROXIDE-SIMETH 200-200-20 MG/5ML PO SUSP: 30.0000 mL | Freq: Four times a day (QID) | ORAL | Status: DC | PRN

## 2015-04-03 MED ORDER — HYDROMORPHONE HCL 1 MG/ML IJ SOLN: 0.5000 mg | INTRAMUSCULAR | Status: DC | PRN

## 2015-04-03 NOTE — Telephone Encounter (Signed)
It appears by epic under encounters that she is being admitted to [facility]

## 2015-04-03 NOTE — Progress Notes (Signed)
Arrived to [facility] per EMS, for direct admission to room 1605. NAD noted at this time. I V noted to left wrist.

## 2015-04-03 NOTE — Telephone Encounter (Signed)
Message received from schedulers during downtime:  "Pt had knee replacement surgery last Monday by Dr. Alvan Dame. Husband says pt has abd/leg swelling, not eating, not drinking. Dr. Alvan Dame is prescribing a diuretic. Husband is concerned if she can take this drug with her heart condition. Pulse by home health 102-104, BP 120/65 approximately."  Patient's husband is extremely concerned about wife's well-being. She had a R knee replacement 8/15 and was doing fine until a couple days ago. For the last couple of days, she has barely been able to urinate at all. She was in and out cathed today by [facility] nurse and got a minimal, bloody sample. Patient's husband st he thinks the knee is infected. It is red and hot. She also is intermittently confused. He also st Dr. Alvan Dame is going by their home after work to check on her. Dr. Alvan Dame wants to start her on Lasix (unknown dosage) due to RLE swelling, but patient's husband is concerned about her other cardiac medications. Instructed patient's husband to wait until assessment from Dr. Selmer Dominion -  that she may end up going to the hospital because she sounds very ill. Patient's husband wants to know if lasix will be OK for patient to start once everything is sorted tonight. Informed him he will be called tomorrow will Dr. Theodosia Blender recommendations on lasix.

## 2015-04-04 ENCOUNTER — Observation Stay (HOSPITAL_COMMUNITY): Payer: Medicare Other

## 2015-04-04 DIAGNOSIS — R410 Disorientation, unspecified: Secondary | ICD-10-CM | POA: Diagnosis not present

## 2015-04-04 DIAGNOSIS — D649 Anemia, unspecified: Secondary | ICD-10-CM | POA: Diagnosis not present

## 2015-04-04 DIAGNOSIS — R4781 Slurred speech: Secondary | ICD-10-CM | POA: Diagnosis not present

## 2015-04-04 LAB — CBC
HCT: 23.1 % — ABNORMAL LOW (ref 36.0–46.0)
Hemoglobin: 8 g/dL — ABNORMAL LOW (ref 12.0–15.0)
MCH: 31.6 pg (ref 26.0–34.0)
MCHC: 34.6 g/dL (ref 30.0–36.0)
MCV: 91.3 fL (ref 78.0–100.0)
PLATELETS: 353 10*3/uL (ref 150–400)
RBC: 2.53 MIL/uL — AB (ref 3.87–5.11)
RDW: 15.7 % — ABNORMAL HIGH (ref 11.5–15.5)
WBC: 17.9 10*3/uL — AB (ref 4.0–10.5)

## 2015-04-04 LAB — PROTIME-INR
INR: 2.27 — AB (ref 0.00–1.49)
PROTHROMBIN TIME: 24.8 s — AB (ref 11.6–15.2)

## 2015-04-04 LAB — BASIC METABOLIC PANEL
Anion gap: 9 (ref 5–15)
BUN: 31 mg/dL — ABNORMAL HIGH (ref 6–20)
CALCIUM: 8.2 mg/dL — AB (ref 8.9–10.3)
CO2: 25 mmol/L (ref 22–32)
CREATININE: 1.45 mg/dL — AB (ref 0.44–1.00)
Chloride: 93 mmol/L — ABNORMAL LOW (ref 101–111)
GFR calc non Af Amer: 35 mL/min — ABNORMAL LOW (ref >60)
GFR, EST AFRICAN AMERICAN: 41 mL/min — AB (ref >60)
Glucose, Bld: 113 mg/dL — ABNORMAL HIGH (ref 65–99)
Potassium: 4.1 mmol/L (ref 3.5–5.1)
SODIUM: 127 mmol/L — AB (ref 135–145)

## 2015-04-04 MED ORDER — CIPROFLOXACIN HCL 500 MG PO TABS
500.0000 mg | ORAL_TABLET | Freq: Two times a day (BID) | ORAL | Status: DC
  Administered 2015-04-04 – 2015-04-05 (×3): 500 mg via ORAL
  Filled 2015-04-04 (×4): qty 1

## 2015-04-04 MED ORDER — ACETAMINOPHEN 325 MG PO TABS
650.0000 mg | ORAL_TABLET | Freq: Four times a day (QID) | ORAL | Status: DC | PRN
  Administered 2015-04-04: 650 mg via ORAL
  Filled 2015-04-04: qty 2

## 2015-04-04 NOTE — Progress Notes (Signed)
Noted patient speech is slurred, and garbled. Patient will not follow commands a this time. Pupils PEARL at 3 mm. Notified Merla Riches PA new orders noted and received.

## 2015-04-04 NOTE — Care Management Note (Signed)
Case Management Note  Patient Details  Name: Jamie Coffey MRN: [id number] Date of Birth: Dec 19, 1940  Subjective/Objective:                   Observation pt Action/Plan:  Discharge planning Expected Discharge Date:                  Expected Discharge Plan:  [place]  In-House Referral:     Discharge planning Services  CM Consult  Post Acute Care Choice:    Choice offered to:     DME Arranged:    DME Agency:     HH Arranged:    HH Agency:     Status of Service:  In process, will continue to follow  Medicare Important Message Given:    Date Medicare IM Given:    Medicare IM give by:    Date Additional Medicare IM Given:    Additional Medicare Important Message give by:     If discussed at [place] of Stay Meetings, dates discussed:    Additional Comments: Pt discharged home with HHPT/RN (for INR management) 03/26/15 and [facility] services.  Pt has a rolling walker and 3n1 from previous hospitalization.  Pt will need resumption HH orders if here more than 24 hours.  CM mailed [facility] rep, Tim to make aware.  CM will continue to follow for needs. Dellie Catholic, RN 04/04/2015, 2:14 PM

## 2015-04-04 NOTE — Progress Notes (Signed)
Utilization review completed.  

## 2015-04-04 NOTE — Progress Notes (Signed)
Patient alert x 3 with episodes of confusion. Spouse states that has been happening since Monday. Patient  states " I don't know what happened earlier tonight." Speech clear at this time.

## 2015-04-05 DIAGNOSIS — D649 Anemia, unspecified: Secondary | ICD-10-CM | POA: Diagnosis not present

## 2015-04-05 LAB — TYPE AND SCREEN
ABO/RH(D): AB POS
ANTIBODY SCREEN: NEGATIVE
UNIT DIVISION: 0
Unit division: 0

## 2015-04-05 LAB — CBC
HEMATOCRIT: 26.1 % — AB (ref 36.0–46.0)
HEMOGLOBIN: 8.8 g/dL — AB (ref 12.0–15.0)
MCH: 31.2 pg (ref 26.0–34.0)
MCHC: 33.7 g/dL (ref 30.0–36.0)
MCV: 92.6 fL (ref 78.0–100.0)
Platelets: 384 10*3/uL (ref 150–400)
RBC: 2.82 MIL/uL — AB (ref 3.87–5.11)
RDW: 17.1 % — ABNORMAL HIGH (ref 11.5–15.5)
WBC: 13.8 10*3/uL — ABNORMAL HIGH (ref 4.0–10.5)

## 2015-04-05 LAB — BASIC METABOLIC PANEL
ANION GAP: 7 (ref 5–15)
BUN: 26 mg/dL — ABNORMAL HIGH (ref 6–20)
CALCIUM: 8.6 mg/dL — AB (ref 8.9–10.3)
CO2: 25 mmol/L (ref 22–32)
Chloride: 101 mmol/L (ref 101–111)
Creatinine, Ser: 0.94 mg/dL (ref 0.44–1.00)
GFR, EST NON AFRICAN AMERICAN: 59 mL/min — AB (ref >60)
Glucose, Bld: 101 mg/dL — ABNORMAL HIGH (ref 65–99)
POTASSIUM: 4.3 mmol/L (ref 3.5–5.1)
Sodium: 133 mmol/L — ABNORMAL LOW (ref 135–145)

## 2015-04-05 LAB — PROTIME-INR
INR: 1.99 — AB (ref 0.00–1.49)
PROTHROMBIN TIME: 22.5 s — AB (ref 11.6–15.2)

## 2015-04-05 MED ORDER — FLUCONAZOLE 150 MG PO TABS
150.0000 mg | ORAL_TABLET | Freq: Once | ORAL | Status: AC
  Administered 2015-04-05: 150 mg via ORAL
  Filled 2015-04-05: qty 1

## 2015-04-05 MED ORDER — WARFARIN SODIUM 2.5 MG PO TABS
2.5000 mg | ORAL_TABLET | Freq: Every day | ORAL | Status: DC
Start: 2015-04-05 — End: 2015-04-05

## 2015-04-05 MED ORDER — CIPROFLOXACIN HCL 500 MG PO TABS: 500.0000 mg | ORAL_TABLET | Freq: Two times a day (BID) | ORAL | Status: DC

## 2015-04-05 NOTE — Discharge Instructions (Signed)

## 2015-04-05 NOTE — Progress Notes (Signed)
     Subjective: Symptomatic Anemia,   S/P right TKA  Patient reports pain as mild, pain controlled. No events throughout the night. Feeling much better after receiving blood. Discussed her INR and how it is at more of a normal level.  Ready to be discharged home with home health.  Objective:   VITALS:   Filed Vitals:   04/05/15 0605  BP: 148/63  Pulse: 68  Temp: 98.5 F (36.9 C)  Resp: 16    Dorsiflexion/Plantar flexion intact Incision: dressing C/D/I No cellulitis present Compartment soft  LABS  Recent Labs  04/04/15 0440 04/05/15 0536  HGB 8.0* 8.8*  HCT 23.1* 26.1*  WBC 17.9* 13.8*  PLT 353 384     Recent Labs  04/04/15 0440 04/05/15 0536  NA 127* 133*  K 4.1 4.3  BUN 31* 26*  CREATININE 1.45* 0.94  GLUCOSE 113* 101*     Assessment/Plan: Symptomatic Anemia,  S/P right TKA  Restarted coumadin at pre-op dosing, no Lovenox for fears of increased bleeding. HHRN to monitor INR, this was set up previously HHPT to work with the right TKA Up with therapy Discharge home with home health       [place] Pugh. Harbert Fitterer   PAC  04/05/2015, 8:26 AM

## 2015-04-05 NOTE — Progress Notes (Signed)
Pt d/c with [facility] for PT and an Therapist, sports. Pt to d/c home. AVS reviewed and "My Chart" discussed with pt. Pt capable of verbalizing medications, dressing changes, signs and symptoms of infection, and follow-up appointments. Remains hemodynamically stable. No signs and symptoms of distress. Educated pt to return to ER in the case of SOB, dizziness, or chest pain.

## 2015-04-05 NOTE — Evaluation (Signed)
Physical Therapy Evaluation Patient Details Name: Jamie Coffey MRN: [id number] DOB: 10-14-1940 Today's Date: 04/05/2015   History of Present Illness  R TKR 8/15, anemia with syncope on 04/03/15, passed out  getting to car to come to the ED. also with UTI.  Clinical Impression  Patient  Reports feeling tired and concerned for  How her R leg is so tight. Patient will benefit from PT to address problems listed in note below to return home at mod I level    Follow Up Recommendations Home health PT;Supervision/Assistance - 24 hour    Equipment Recommendations  None recommended by PT    Recommendations for Other Services       Precautions / Restrictions Precautions Precautions: Fall;Knee      Mobility  Bed Mobility Overal bed mobility: Needs Assistance       Supine to sit: Mod assist;HOB elevated     General bed mobility comments: patient  appeared distressed with difficulty getting to sitting, support at trunk to sit up. Assist R leg  Transfers   Equipment used: Rolling walker (2 wheeled) Transfers: Sit to/from [facility] Sit to Stand: Min guard Stand pivot transfers: Min assist       General transfer comment: verbal cues for hand placement and LE management. pivot to [facility]  Ambulation/Gait Ambulation/Gait assistance: Min guard Ambulation Distance (Feet): 20 Feet Assistive device: Rolling walker (2 wheeled) Gait Pattern/deviations: Step-to pattern;Decreased step length - right;Decreased stance time - right;Antalgic     General Gait Details: cues for sequence, posture and position from RW  Stairs            Wheelchair Mobility    Modified Rankin (Stroke Patients Only)       Balance                                             Pertinent Vitals/Pain Pain Score: 4  Pain Location: R knee Pain Descriptors / Indicators: Burning;Tightness;Throbbing Pain Intervention(s): Limited activity within patient's  tolerance;Monitored during session;Ice applied    Home Living Family/patient expects to be discharged to:: Private residence Living Arrangements: Spouse/significant other Available Help at Discharge: Family Type of Home: House Home Access: Stairs to enter   Technical brewer of Steps: 1 Home Layout: One level        Prior Function Level of Independence: Independent with assistive device(s)         Comments: having HHPT     Hand Dominance        Extremity/Trunk Assessment   Upper Extremity Assessment: Overall WFL for tasks assessed           Lower Extremity Assessment: RLE deficits/detail RLE Deficits / Details: knee flexion=60, requires assist for SLR, bruising along entire leg    Cervical / Trunk Assessment: Normal;Other exceptions  Communication   Communication: No difficulties  Cognition Arousal/Alertness: Awake/alert Behavior During Therapy:  (tearful about setback.) Overall Cognitive Status: Within Functional Limits for tasks assessed                      General Comments      Exercises Total Joint Exercises Ankle Circles/Pumps: AROM;Right Heel Slides: AAROM;Right;10 reps;Supine Hip ABduction/ADduction: AAROM;Right;10 reps;Supine Straight Leg Raises: AAROM;Right;10 reps;Supine      Assessment/Plan    PT Assessment Patient needs continued PT services  PT Diagnosis Difficulty walking   PT Problem List  Decreased strength;Decreased range of motion;Decreased activity tolerance;Decreased mobility;Decreased knowledge of use of DME;Pain;Decreased knowledge of precautions  PT Treatment Interventions DME instruction;Gait training;Stair training;Functional mobility training;Therapeutic activities;Therapeutic exercise;Patient/family education   PT Goals (Current goals can be found in the Care Plan section) Acute Rehab PT Goals Patient Stated Goal: return to independence. get back to where I was. PT Goal Formulation: With patient/family Time  For Goal Achievement: 04/12/15 Potential to Achieve Goals: Good    Frequency Min 6X/week   Barriers to discharge        Co-evaluation               End of Session   Activity Tolerance: Patient limited by pain;Patient limited by fatigue Patient left: in chair;with call bell/phone within reach;with family/visitor present Nurse Communication: Mobility status    Functional Assessment Tool Used: clinical judgement Functional Limitation: Mobility: Walking and moving around Mobility: Walking and Moving Around Current Status (U2725): At least 20 percent but less than 40 percent impaired, limited or restricted Mobility: Walking and Moving Around Goal Status [phone]): At least 1 percent but less than 20 percent impaired, limited or restricted    Time: 0347-4259 PT Time Calculation (min) (ACUTE ONLY): 55 min   Charges:   PT Evaluation $Initial PT Evaluation Tier I: 1 Procedure PT Treatments $Gait Training: 8-22 mins $Therapeutic Exercise: 8-22 mins $Self Care/Home Management: 8-22   PT G Codes:   PT G-Codes **NOT FOR INPATIENT CLASS** Functional Assessment Tool Used: clinical judgement Functional Limitation: Mobility: Walking and moving around Mobility: Walking and Moving Around Current Status (D6387): At least 20 percent but less than 40 percent impaired, limited or restricted Mobility: Walking and Moving Around Goal Status [phone]): At least 1 percent but less than 20 percent impaired, limited or restricted    Claretha Cooper 04/05/2015, 1:04 PM Tresa Endo PT [phone]

## 2015-04-05 NOTE — H&P (Signed)
Jamie Coffey is an 74 y.o. female.    Chief Complaint:     Symptomatic anemia s/p right TKA  HPI: Pt is a 74 y.o. female, with a TKA on 03/25/2015 by Dr. Alvan Dame.  She was doing initially doing well with recovery from surgery at home.  The last couple of days she strated to have leg swelling, not eating, not drinking and increasing confusion. The last couple of days, she has barely been able to urinate much as well. She was in and out cathed today by [name] nurse and got a minimal, bloody sample. Confusion while had home regarding labs that were drawn prior to the weekend, but the family states that they were not given the results until Monday, which revealed an INR of geater than 8.0.  A subsequent lab revealed a INR in the 5s.  Do the increase INR and a hgb in the 6s she was readmitted for management of these conditions.  Dr. Alvan Dame discussed with the family regarding administering blood to reverse the hgb and stopping the Coumadin for a short time to reverse the INR. Admitted to observation status to treat and follow these values and the patient mental status.   PCP: Jamie Lopes, MD   PMH: Past Medical History  Diagnosis Date  . PAF (paroxysmal atrial fibrillation)     a. 05/2011 Echo: EF 70%, triv MR, mild AI, mod TR, Gr 1 DD.  . Factor 5 Leiden mutation, heterozygous     a. chronic xarelto.  . Chest pain, unspecified     a. 12/2013 MV: EF 65%, no ischemia/infarct.  . Mitral valve prolapse   . Dysphagia   . GERD (gastroesophageal reflux disease)   . History of DVT & PE ~ 2000    S/P RLE knee scope  . Hypertension   . Breast cancer 2010    "left", s/p XRT  . Hypothyroidism   . H/O hiatal hernia   . Arthritis     "joints" (01/18/2014)  . Sleep deprivation     "for years and years" (01/18/2014)  . Family history of adverse reaction to anesthesia     mother developed irreg heart rate during surgery  . History of kidney stones   . Difficulty sleeping     dificulty staying asleep    . Depression   . Blood dyscrasia     factor 5 Leiden  . Sleep terrors     PSH: Past Surgical History  Procedure Laterality Date  . Tonsillectomy    . Knee arthroscopy Right ~ 2000  . Knee arthroscopy Left ~ 2009  . Abdominal hysterectomy  1983  . Dilation and curettage of uterus  1980's  . Tubal ligation  1980's  . Breast biopsy Right 2010  . Breast lumpectomy Left 2010  . Left heart catheterization with coronary angiogram N/A 01/19/2014    Procedure: LEFT HEART CATHETERIZATION WITH CORONARY ANGIOGRAM;  Surgeon: Jamie Man, MD;  Location: [facility] CATH LAB;  Service: Cardiovascular;  Laterality: N/A;  . Total knee arthroplasty Right 03/25/2015    Procedure: RIGHT TOTAL KNEE ARTHROPLASTY;  Surgeon: Jamie Cancel, MD;  Location: [facility] ORS;  Service: Orthopedics;  Laterality: Right;    Social History:  reports that she has never smoked. She has never used smokeless tobacco. She reports that she drinks alcohol. She reports that she does not use illicit drugs.  Allergies:  No Known Allergies  Medications: Current Facility-Administered Medications  Medication Dose Route Frequency Provider Last Rate Last Dose  .  0.9 %  sodium chloride infusion   Intravenous Continuous Danae Orleans, PA-C   Stopped at 04/05/15 [phone]  . 0.9 %  sodium chloride infusion   Intravenous Once Danae Orleans, PA-C      . acetaminophen (TYLENOL) tablet 650 mg  650 mg Oral Q6H PRN Jamie Cancel, MD   650 mg at 04/04/15 1335  . alum & mag hydroxide-simeth (MAALOX/MYLANTA) 200-200-20 MG/5ML suspension 30 mL  30 mL Oral Q6H PRN Danae Orleans, PA-C      . bisacodyl (DULCOLAX) suppository 10 mg  10 mg Rectal Daily PRN Danae Orleans, PA-C      . ciprofloxacin (CIPRO) tablet 500 mg  500 mg Oral BID Danae Orleans, PA-C   500 mg at 04/05/15 0946  . docusate sodium (COLACE) capsule 100 mg  100 mg Oral BID Danae Orleans, PA-C   100 mg at 04/05/15 0946  . ferrous sulfate tablet 325 mg  325 mg Oral TID WC Danae Orleans, PA-C    325 mg at 04/05/15 0846  . HYDROcodone-acetaminophen (NORCO/VICODIN) 5-325 MG per tablet 1-2 tablet  1-2 tablet Oral Q4H PRN Danae Orleans, PA-C      . HYDROmorphone (DILAUDID) injection 0.5-1 mg  0.5-1 mg Intravenous Q2H PRN Danae Orleans, PA-C      . magnesium citrate solution 1 Bottle  1 Bottle Oral Once PRN Danae Orleans, PA-C      . ondansetron [facility]) tablet 4 mg  4 mg Oral Q6H PRN Danae Orleans, PA-C       Or  . ondansetron [facility]) injection 4 mg  4 mg Intravenous Q6H PRN Danae Orleans, PA-C      . polyethylene glycol (MIRALAX / GLYCOLAX) packet 17 g  17 g Oral Daily PRN Danae Orleans, PA-C       Current Outpatient Prescriptions  Medication Sig Dispense Refill  . amLODipine (NORVASC) 2.5 MG tablet TAKE 1 TABLET BY MOUTH ONCE DAILY 30 tablet 3  . celecoxib (CELEBREX) 200 MG capsule Take 200 mg by mouth daily. Monday through Friday. Does not take on the weekend    . desvenlafaxine (PRISTIQ) 50 MG 24 hr tablet Take 50 mg by mouth at bedtime.     . docusate sodium (COLACE) 100 MG capsule Take 1 capsule (100 mg total) by mouth 2 (two) times daily. 10 capsule 0  . ferrous sulfate 325 (65 FE) MG tablet Take 1 tablet (325 mg total) by mouth 3 (three) times daily after meals.  3  . furosemide (LASIX) 20 MG tablet Take 20 mg by mouth 2 (two) times daily.    Marland Kitchen levothyroxine (SYNTHROID, LEVOTHROID) 75 MCG tablet Take 75 mcg by mouth daily before breakfast.    . nebivolol (BYSTOLIC) 2.5 MG tablet Take 1 tablet (2.5 mg total) by mouth daily. 30 tablet 6  . polyethylene glycol (MIRALAX / GLYCOLAX) packet Take 17 g by mouth 2 (two) times daily. (Patient taking differently: Take 17 g by mouth daily as needed for mild constipation or moderate constipation. ) 14 each 0  . ciprofloxacin (CIPRO) 500 MG tablet Take 1 tablet (500 mg total) by mouth 2 (two) times daily. 10 tablet 0  . tiZANidine (ZANAFLEX) 4 MG tablet Take 1 tablet (4 mg total) by mouth every 6 (six) hours as needed for muscle spasms. 40  tablet 0  . warfarin (COUMADIN) 5 MG tablet TAKE AS DIRECTED BY COUMADIN CLINIC (Patient taking differently: Take 2.5-5 mg by mouth daily) 30 tablet 3  . zolpidem (AMBIEN) 10 MG tablet Take 10 mg  by mouth at bedtime as needed for sleep.       Results for orders placed or performed during the hospital encounter of 04/03/15 (from the past 48 hour(s))  Prepare RBC     Status: None   Collection Time: 04/03/15 10:13 PM  Result Value Ref Range   Order Confirmation ORDER PROCESSED BY BLOOD BANK   Type and screen     Status: None   Collection Time: 04/03/15 10:13 PM  Result Value Ref Range   ABO/RH(D) AB POS    Antibody Screen NEG    Sample Expiration 04/06/2015    Unit Number [id number]    Blood Component Type RED CELLS,LR    Unit division 00    Status of Unit ISSUED,FINAL    Transfusion Status OK TO TRANSFUSE    Crossmatch Result Compatible    Unit Number [id number]    Blood Component Type RED CELLS,LR    Unit division 00    Status of Unit ISSUED,FINAL    Transfusion Status OK TO TRANSFUSE    Crossmatch Result Compatible   Basic metabolic panel     Status: Abnormal   Collection Time: 04/04/15  4:40 AM  Result Value Ref Range   Sodium 127 (L) 135 - 145 mmol/L   Potassium 4.1 3.5 - 5.1 mmol/L   Chloride 93 (L) 101 - 111 mmol/L   CO2 25 22 - 32 mmol/L   Glucose, Bld 113 (H) 65 - 99 mg/dL   BUN 31 (H) 6 - 20 mg/dL   Creatinine, Ser 1.45 (H) 0.44 - 1.00 mg/dL   Calcium 8.2 (L) 8.9 - 10.3 mg/dL   GFR calc non Af Amer 35 (L) >60 mL/min   GFR calc Af Amer 41 (L) >60 mL/min    Comment: (NOTE) The eGFR has been calculated using the CKD EPI equation. This calculation has not been validated in all clinical situations. eGFR's persistently <60 mL/min signify possible Chronic Kidney Disease.    Anion gap 9 5 - 15  CBC     Status: Abnormal   Collection Time: 04/04/15  4:40 AM  Result Value Ref Range   WBC 17.9 (H) 4.0 - 10.5 K/uL   RBC 2.53 (L) 3.87 - 5.11 MIL/uL   Hemoglobin 8.0  (L) 12.0 - 15.0 g/dL   HCT 23.1 (L) 36.0 - 46.0 %   MCV 91.3 78.0 - 100.0 fL   MCH 31.6 26.0 - 34.0 pg   MCHC 34.6 30.0 - 36.0 g/dL   RDW 15.7 (H) 11.5 - 15.5 %   Platelets 353 150 - 400 K/uL  Protime-INR     Status: Abnormal   Collection Time: 04/04/15  5:05 PM  Result Value Ref Range   Prothrombin Time 24.8 (H) 11.6 - 15.2 seconds   INR 2.27 (H) 0.00 - 3.08  Basic metabolic panel     Status: Abnormal   Collection Time: 04/05/15  5:36 AM  Result Value Ref Range   Sodium 133 (L) 135 - 145 mmol/L   Potassium 4.3 3.5 - 5.1 mmol/L   Chloride 101 101 - 111 mmol/L   CO2 25 22 - 32 mmol/L   Glucose, Bld 101 (H) 65 - 99 mg/dL   BUN 26 (H) 6 - 20 mg/dL   Creatinine, Ser 0.94 0.44 - 1.00 mg/dL   Calcium 8.6 (L) 8.9 - 10.3 mg/dL   GFR calc non Af Amer 59 (L) >60 mL/min   GFR calc Af Amer >60 >60 mL/min    Comment: (NOTE) The  eGFR has been calculated using the CKD EPI equation. This calculation has not been validated in all clinical situations. eGFR's persistently <60 mL/min signify possible Chronic Kidney Disease.    Anion gap 7 5 - 15  CBC     Status: Abnormal   Collection Time: 04/05/15  5:36 AM  Result Value Ref Range   WBC 13.8 (H) 4.0 - 10.5 K/uL   RBC 2.82 (L) 3.87 - 5.11 MIL/uL   Hemoglobin 8.8 (L) 12.0 - 15.0 g/dL   HCT 26.1 (L) 36.0 - 46.0 %   MCV 92.6 78.0 - 100.0 fL   MCH 31.2 26.0 - 34.0 pg   MCHC 33.7 30.0 - 36.0 g/dL   RDW 17.1 (H) 11.5 - 15.5 %   Platelets 384 150 - 400 K/uL  Protime-INR     Status: Abnormal   Collection Time: 04/05/15  5:36 AM  Result Value Ref Range   Prothrombin Time 22.5 (H) 11.6 - 15.2 seconds   INR 1.99 (H) 0.00 - 1.49   Ct Head Wo Contrast  04/04/2015   CLINICAL DATA:  Increasing confusion beginning at about 2300 hr on 04/03/2015. Slurred speech.  EXAM: CT HEAD WITHOUT CONTRAST  TECHNIQUE: Contiguous axial images were obtained from the base of the skull through the vertex without intravenous contrast.  COMPARISON:  None.  FINDINGS:  Ventricles and sulci appear symmetrical. No mass effect or midline shift. No abnormal extra-axial fluid collections. Gray-white matter junctions are distinct. Basal cisterns are not effaced. No evidence of acute intracranial hemorrhage. No depressed skull fractures. Visualized paranasal sinuses and mastoid air cells are not opacified.  IMPRESSION: No acute intracranial abnormalities.   Electronically Signed   By: Lucienne Capers M.D.   On: 04/04/2015 00:56     Review of Systems  HENT: Negative.   Eyes: Negative.   Respiratory: Negative.   Cardiovascular: Positive for leg swelling.  Gastrointestinal: Positive for heartburn.  Genitourinary: Positive for dysuria.  Musculoskeletal: Positive for joint pain.  Skin: Negative.   Neurological: Positive for dizziness and weakness.  Endo/Heme/Allergies: Negative.   Psychiatric/Behavioral: Positive for depression.       Physical Exam  Constitutional: She appears well-developed. She appears lethargic.  HENT:  Head: Normocephalic.  Eyes: Pupils are equal, round, and reactive to light.  Neck: Neck supple. No JVD present. No tracheal deviation present. No thyromegaly present.  Cardiovascular: Normal rate, regular rhythm, normal heart sounds and intact distal pulses.   Respiratory: Effort normal and breath sounds normal.  GI: Soft. There is no tenderness. There is no guarding.  Musculoskeletal:       Right knee: She exhibits decreased range of motion, swelling, effusion, ecchymosis, laceration (healing TKA incision) and bony tenderness. She exhibits no deformity and normal alignment. Tenderness found.  Lymphadenopathy:    She has no cervical adenopathy.  Neurological: She appears lethargic.  Skin: Skin is warm and dry.  Psychiatric: Her affect is inappropriate. She is slowed. Cognition and memory are impaired.      Assessment/Plan Assessment:   Symptomatic anemia s/p right TKA   Plan: Patient will be admitted to observation status per Dr.  Alvan Dame at [facility] for treatment of symptomatic anemia s/p right TKA.  Risks benefits and expectations were discussed with the patient. Patient understand risks, benefits and expectations and wishes to proceed.      West Pugh Tayshaun Kroh   PA-C  04/04/2015, 4:01 PM

## 2015-04-06 DIAGNOSIS — I10 Essential (primary) hypertension: Secondary | ICD-10-CM | POA: Diagnosis not present

## 2015-04-06 DIAGNOSIS — F329 Major depressive disorder, single episode, unspecified: Secondary | ICD-10-CM | POA: Diagnosis not present

## 2015-04-06 DIAGNOSIS — M1990 Unspecified osteoarthritis, unspecified site: Secondary | ICD-10-CM | POA: Diagnosis not present

## 2015-04-06 DIAGNOSIS — D688 Other specified coagulation defects: Secondary | ICD-10-CM | POA: Diagnosis not present

## 2015-04-06 DIAGNOSIS — I480 Paroxysmal atrial fibrillation: Secondary | ICD-10-CM | POA: Diagnosis not present

## 2015-04-06 DIAGNOSIS — Z471 Aftercare following joint replacement surgery: Secondary | ICD-10-CM | POA: Diagnosis not present

## 2015-04-07 DIAGNOSIS — M1990 Unspecified osteoarthritis, unspecified site: Secondary | ICD-10-CM | POA: Diagnosis not present

## 2015-04-07 DIAGNOSIS — D688 Other specified coagulation defects: Secondary | ICD-10-CM | POA: Diagnosis not present

## 2015-04-07 DIAGNOSIS — Z471 Aftercare following joint replacement surgery: Secondary | ICD-10-CM | POA: Diagnosis not present

## 2015-04-07 DIAGNOSIS — I10 Essential (primary) hypertension: Secondary | ICD-10-CM | POA: Diagnosis not present

## 2015-04-07 DIAGNOSIS — I480 Paroxysmal atrial fibrillation: Secondary | ICD-10-CM | POA: Diagnosis not present

## 2015-04-07 DIAGNOSIS — F329 Major depressive disorder, single episode, unspecified: Secondary | ICD-10-CM | POA: Diagnosis not present

## 2015-04-08 ENCOUNTER — Ambulatory Visit (INDEPENDENT_AMBULATORY_CARE_PROVIDER_SITE_OTHER): Payer: Medicare Other | Admitting: Cardiovascular Disease

## 2015-04-08 DIAGNOSIS — I10 Essential (primary) hypertension: Secondary | ICD-10-CM | POA: Diagnosis not present

## 2015-04-08 DIAGNOSIS — Z471 Aftercare following joint replacement surgery: Secondary | ICD-10-CM | POA: Diagnosis not present

## 2015-04-08 DIAGNOSIS — D688 Other specified coagulation defects: Secondary | ICD-10-CM | POA: Diagnosis not present

## 2015-04-08 DIAGNOSIS — Z5181 Encounter for therapeutic drug level monitoring: Secondary | ICD-10-CM

## 2015-04-08 DIAGNOSIS — F329 Major depressive disorder, single episode, unspecified: Secondary | ICD-10-CM | POA: Diagnosis not present

## 2015-04-08 DIAGNOSIS — I480 Paroxysmal atrial fibrillation: Secondary | ICD-10-CM | POA: Diagnosis not present

## 2015-04-08 DIAGNOSIS — M1990 Unspecified osteoarthritis, unspecified site: Secondary | ICD-10-CM | POA: Diagnosis not present

## 2015-04-08 DIAGNOSIS — Z86711 Personal history of pulmonary embolism: Secondary | ICD-10-CM

## 2015-04-08 LAB — POCT INR: INR: 2.7

## 2015-04-09 DIAGNOSIS — I480 Paroxysmal atrial fibrillation: Secondary | ICD-10-CM | POA: Diagnosis not present

## 2015-04-09 DIAGNOSIS — Z471 Aftercare following joint replacement surgery: Secondary | ICD-10-CM | POA: Diagnosis not present

## 2015-04-09 DIAGNOSIS — D688 Other specified coagulation defects: Secondary | ICD-10-CM | POA: Diagnosis not present

## 2015-04-09 DIAGNOSIS — I10 Essential (primary) hypertension: Secondary | ICD-10-CM | POA: Diagnosis not present

## 2015-04-09 DIAGNOSIS — M1990 Unspecified osteoarthritis, unspecified site: Secondary | ICD-10-CM | POA: Diagnosis not present

## 2015-04-09 DIAGNOSIS — F329 Major depressive disorder, single episode, unspecified: Secondary | ICD-10-CM | POA: Diagnosis not present

## 2015-04-10 ENCOUNTER — Telehealth: Payer: Self-pay | Admitting: *Deleted

## 2015-04-10 DIAGNOSIS — D688 Other specified coagulation defects: Secondary | ICD-10-CM | POA: Diagnosis not present

## 2015-04-10 DIAGNOSIS — Z96651 Presence of right artificial knee joint: Secondary | ICD-10-CM | POA: Diagnosis not present

## 2015-04-10 DIAGNOSIS — I10 Essential (primary) hypertension: Secondary | ICD-10-CM | POA: Diagnosis not present

## 2015-04-10 DIAGNOSIS — M1990 Unspecified osteoarthritis, unspecified site: Secondary | ICD-10-CM | POA: Diagnosis not present

## 2015-04-10 DIAGNOSIS — Z471 Aftercare following joint replacement surgery: Secondary | ICD-10-CM | POA: Diagnosis not present

## 2015-04-10 DIAGNOSIS — I480 Paroxysmal atrial fibrillation: Secondary | ICD-10-CM | POA: Diagnosis not present

## 2015-04-10 DIAGNOSIS — F329 Major depressive disorder, single episode, unspecified: Secondary | ICD-10-CM | POA: Diagnosis not present

## 2015-04-10 NOTE — Telephone Encounter (Signed)
Signed order for PT/INR faxed to [facility].

## 2015-04-11 ENCOUNTER — Ambulatory Visit (INDEPENDENT_AMBULATORY_CARE_PROVIDER_SITE_OTHER): Payer: Medicare Other

## 2015-04-11 DIAGNOSIS — I480 Paroxysmal atrial fibrillation: Secondary | ICD-10-CM

## 2015-04-11 DIAGNOSIS — Z5181 Encounter for therapeutic drug level monitoring: Secondary | ICD-10-CM

## 2015-04-11 DIAGNOSIS — M1990 Unspecified osteoarthritis, unspecified site: Secondary | ICD-10-CM | POA: Diagnosis not present

## 2015-04-11 DIAGNOSIS — I10 Essential (primary) hypertension: Secondary | ICD-10-CM | POA: Diagnosis not present

## 2015-04-11 DIAGNOSIS — D688 Other specified coagulation defects: Secondary | ICD-10-CM | POA: Diagnosis not present

## 2015-04-11 DIAGNOSIS — F329 Major depressive disorder, single episode, unspecified: Secondary | ICD-10-CM | POA: Diagnosis not present

## 2015-04-11 DIAGNOSIS — Z86711 Personal history of pulmonary embolism: Secondary | ICD-10-CM

## 2015-04-11 DIAGNOSIS — Z471 Aftercare following joint replacement surgery: Secondary | ICD-10-CM | POA: Diagnosis not present

## 2015-04-11 LAB — POCT INR: INR: 2.7

## 2015-04-12 DIAGNOSIS — I480 Paroxysmal atrial fibrillation: Secondary | ICD-10-CM | POA: Diagnosis not present

## 2015-04-12 DIAGNOSIS — M1990 Unspecified osteoarthritis, unspecified site: Secondary | ICD-10-CM | POA: Diagnosis not present

## 2015-04-12 DIAGNOSIS — Z471 Aftercare following joint replacement surgery: Secondary | ICD-10-CM | POA: Diagnosis not present

## 2015-04-12 DIAGNOSIS — N390 Urinary tract infection, site not specified: Secondary | ICD-10-CM | POA: Diagnosis not present

## 2015-04-12 DIAGNOSIS — R8299 Other abnormal findings in urine: Secondary | ICD-10-CM | POA: Diagnosis not present

## 2015-04-12 DIAGNOSIS — I10 Essential (primary) hypertension: Secondary | ICD-10-CM | POA: Diagnosis not present

## 2015-04-12 DIAGNOSIS — F329 Major depressive disorder, single episode, unspecified: Secondary | ICD-10-CM | POA: Diagnosis not present

## 2015-04-12 DIAGNOSIS — D688 Other specified coagulation defects: Secondary | ICD-10-CM | POA: Diagnosis not present

## 2015-04-16 DIAGNOSIS — M1711 Unilateral primary osteoarthritis, right knee: Secondary | ICD-10-CM | POA: Diagnosis not present

## 2015-04-16 NOTE — Discharge Summary (Signed)
Physician Discharge Summary  Patient ID: JOEANNE ROBICHEAUX MRN: [id number] DOB/AGE: Nov 14, 1940 74 y.o.  Admit date: 04/03/2015 Discharge date: 04/05/2015   Procedures:   None  Attending Physician:  Dr. Paralee Cancel   Admission Diagnoses:   Symptomatic anemia s/p right TKA  Discharge Diagnoses:  Active Problems:   Symptomatic anemia  Past Medical History  Diagnosis Date  . PAF (paroxysmal atrial fibrillation)     a. 05/2011 Echo: EF 70%, triv MR, mild AI, mod TR, Gr 1 DD.  . Factor 5 Leiden mutation, heterozygous     a. chronic xarelto.  . Chest pain, unspecified     a. 12/2013 MV: EF 65%, no ischemia/infarct.  . Mitral valve prolapse   . Dysphagia   . GERD (gastroesophageal reflux disease)   . History of DVT & PE ~ 2000    S/P RLE knee scope  . Hypertension   . Breast cancer 2010    "left", s/p XRT  . Hypothyroidism   . H/O hiatal hernia   . Arthritis     "joints" (01/18/2014)  . Sleep deprivation     "for years and years" (01/18/2014)  . Family history of adverse reaction to anesthesia     mother developed irreg heart rate during surgery  . History of kidney stones   . Difficulty sleeping     dificulty staying asleep  . Depression   . Blood dyscrasia     factor 5 Leiden  . Sleep terrors     HPI:    Pt is a 74 y.o. female, with a TKA on 03/25/2015 by Dr. Alvan Dame. She was doing initially doing well with recovery from surgery at home. The last couple of days she strated to have leg swelling, not eating, not drinking and increasing confusion. The last couple of days, she has barely been able to urinate much as well. She was in and out cathed today by [facility] nurse and got a minimal, bloody sample. Confusion while had home regarding labs that were drawn prior to the weekend, but the family states that they were not given the results until Monday, which revealed an INR of geater than 8.0. A subsequent lab revealed a INR in the 5s. Do the increase INR and a hgb in the 6s she was  readmitted for management of these conditions. Dr. Alvan Dame discussed with the family regarding administering blood to reverse the hgb and stopping the Coumadin for a short time to reverse the INR. Admitted to observation status to treat and follow these values and the patient mental status.   PCP: [name], MD   Discharged Condition: good  Hospital Course:  Patient underwent the above stated procedure on 04/03/2015. Patient tolerated the procedure well and brought to the recovery room in good condition and subsequently to the floor.  POD #1 BP: 130/60 ; Pulse: 78 ; Temp: 100.2 F (37.9 C) ; Resp: 20 Patient feels some better after receiving blood yesterday.  Still a little out of it. Will continue to monitor INR to make sure that she maintains a therapeutic level, but come done from where it was as 5.4 and higher. Dorsiflexion/plantar flexion intact, incision: dressing C/D/I, no cellulitis present and compartment soft.   LABS  Basename    HGB  8.0  HCT  23.1  INR 2.27   POD #2  BP: 148/63 ; Pulse: 68 ; Temp: 98.5 F (36.9 C) ; Resp: 16 Patient reports pain as mild, pain controlled. No events throughout the night.  Feeling much better after receiving blood. Discussed her INR and how it is at more of a normal level. Ready to be discharged home with home health. Dorsiflexion/plantar flexion intact, incision: dressing C/D/I, no cellulitis present and compartment soft.   LABS  Basename    HGB  8.8  HCT  26.1  INR 1.99    Discharge Exam: General appearance: alert, cooperative and no distress Extremities: Homans sign is negative, no sign of DVT, no edema, redness or tenderness in the calves or thighs and no ulcers, gangrene or trophic changes  Disposition: Home with follow up in 2 weeks   Follow-up Information    Follow up with Mauri Pole, MD. Schedule an appointment as soon as possible for a visit in 2 weeks.   Specialty:  Orthopedic Surgery   Contact information:   [address] [place] [address] [phone]       Discharge Instructions    Call MD / Call 911    Complete by:  As directed   If you experience chest pain or shortness of breath, CALL 911 and be transported to the hospital emergency room.  If you develope a fever above 101 F, pus (white drainage) or increased drainage or redness at the wound, or calf pain, call your surgeon's office.     Change dressing    Complete by:  As directed   Maintain surgical dressing until follow up in the clinic. If the edges start to pull up, may reinforce with tape. If the dressing is no longer working, may remove and cover with gauze and tape, but must keep the area dry and clean.  Call with any questions or concerns.     Constipation Prevention    Complete by:  As directed   Drink plenty of fluids.  Prune juice may be helpful.  You may use a stool softener, such as Colace (over the counter) 100 mg twice a day.  Use MiraLax (over the counter) for constipation as needed.     Diet - low sodium heart healthy    Complete by:  As directed      Discharge instructions    Complete by:  As directed   Maintain surgical dressing until follow up in the clinic. If the edges start to pull up, may reinforce with tape. If the dressing is no longer working, may remove and cover with gauze and tape, but must keep the area dry and clean.  Follow up in 2 weeks at [facility]. Call with any questions or concerns.     Increase activity slowly as tolerated    Complete by:  As directed   Weight bearing as tolerated with assist device (walker, cane, etc) as directed, use it as long as suggested by your surgeon or therapist, typically at least 4-6 weeks.     TED hose    Complete by:  As directed   Use stockings (TED hose) for 2 weeks on both leg(s).  You may remove them at night for sleeping.             Medication List    STOP taking these medications        cephALEXin 500 MG capsule  Commonly  known as:  KEFLEX     enoxaparin 60 MG/0.6ML injection  Commonly known as:  LOVENOX     HYDROcodone-acetaminophen 7.5-325 MG per tablet  Commonly known as:  [place] these medications  amLODipine 2.5 MG tablet  Commonly known as:  NORVASC  TAKE 1 TABLET BY MOUTH ONCE DAILY     celecoxib 200 MG capsule  Commonly known as:  CELEBREX  Take 200 mg by mouth daily. Monday through Friday. Does not take on the weekend     ciprofloxacin 500 MG tablet  Commonly known as:  CIPRO  Take 1 tablet (500 mg total) by mouth 2 (two) times daily.     desvenlafaxine 50 MG 24 hr tablet  Commonly known as:  PRISTIQ  Take 50 mg by mouth at bedtime.     docusate sodium 100 MG capsule  Commonly known as:  COLACE  Take 1 capsule (100 mg total) by mouth 2 (two) times daily.     ferrous sulfate 325 (65 FE) MG tablet  Take 1 tablet (325 mg total) by mouth 3 (three) times daily after meals.     furosemide 20 MG tablet  Commonly known as:  LASIX  Take 20 mg by mouth 2 (two) times daily.     levothyroxine 75 MCG tablet  Commonly known as:  SYNTHROID, LEVOTHROID  Take 75 mcg by mouth daily before breakfast.     nebivolol 2.5 MG tablet  Commonly known as:  BYSTOLIC  Take 1 tablet (2.5 mg total) by mouth daily.     polyethylene glycol packet  Commonly known as:  MIRALAX / GLYCOLAX  Take 17 g by mouth 2 (two) times daily.     tiZANidine 4 MG tablet  Commonly known as:  ZANAFLEX  Take 1 tablet (4 mg total) by mouth every 6 (six) hours as needed for muscle spasms.     warfarin 5 MG tablet  Commonly known as:  COUMADIN  TAKE AS DIRECTED BY COUMADIN CLINIC     zolpidem 10 MG tablet  Commonly known as:  AMBIEN  Take 10 mg by mouth at bedtime as needed for sleep.         Signed: [place] Pugh. Jaquelyn Sakamoto   PA-C  04/16/2015, 12:06 PM

## 2015-04-18 ENCOUNTER — Ambulatory Visit (INDEPENDENT_AMBULATORY_CARE_PROVIDER_SITE_OTHER): Payer: Medicare Other | Admitting: *Deleted

## 2015-04-18 DIAGNOSIS — D6851 Activated protein C resistance: Secondary | ICD-10-CM | POA: Diagnosis not present

## 2015-04-18 DIAGNOSIS — I480 Paroxysmal atrial fibrillation: Secondary | ICD-10-CM | POA: Diagnosis not present

## 2015-04-18 DIAGNOSIS — Z86711 Personal history of pulmonary embolism: Secondary | ICD-10-CM

## 2015-04-18 DIAGNOSIS — M1711 Unilateral primary osteoarthritis, right knee: Secondary | ICD-10-CM | POA: Diagnosis not present

## 2015-04-18 DIAGNOSIS — Z5181 Encounter for therapeutic drug level monitoring: Secondary | ICD-10-CM | POA: Diagnosis not present

## 2015-04-18 LAB — POCT INR: INR: 3.7

## 2015-04-22 ENCOUNTER — Emergency Department (HOSPITAL_COMMUNITY): Payer: Medicare Other

## 2015-04-22 ENCOUNTER — Encounter (HOSPITAL_COMMUNITY): Payer: Self-pay | Admitting: Emergency Medicine

## 2015-04-22 ENCOUNTER — Emergency Department (HOSPITAL_COMMUNITY)
Admission: EM | Admit: 2015-04-22 | Discharge: 2015-04-22 | Disposition: A | Discharge status: Home / Self Care | Payer: Medicare Other | Attending: Emergency Medicine | Admitting: Emergency Medicine

## 2015-04-22 ENCOUNTER — Emergency Department (HOSPITAL_BASED_OUTPATIENT_CLINIC_OR_DEPARTMENT_OTHER): Payer: Medicare Other

## 2015-04-22 ENCOUNTER — Telehealth: Payer: Self-pay | Admitting: Cardiology

## 2015-04-22 DIAGNOSIS — E039 Hypothyroidism, unspecified: Secondary | ICD-10-CM | POA: Diagnosis not present

## 2015-04-22 DIAGNOSIS — R0789 Other chest pain: Secondary | ICD-10-CM

## 2015-04-22 DIAGNOSIS — R0602 Shortness of breath: Secondary | ICD-10-CM | POA: Diagnosis not present

## 2015-04-22 DIAGNOSIS — Z86718 Personal history of other venous thrombosis and embolism: Secondary | ICD-10-CM | POA: Insufficient documentation

## 2015-04-22 DIAGNOSIS — I10 Essential (primary) hypertension: Secondary | ICD-10-CM | POA: Diagnosis not present

## 2015-04-22 DIAGNOSIS — Z79899 Other long term (current) drug therapy: Secondary | ICD-10-CM | POA: Diagnosis not present

## 2015-04-22 DIAGNOSIS — Z853 Personal history of malignant neoplasm of breast: Secondary | ICD-10-CM | POA: Insufficient documentation

## 2015-04-22 DIAGNOSIS — M79609 Pain in unspecified limb: Secondary | ICD-10-CM | POA: Diagnosis not present

## 2015-04-22 DIAGNOSIS — K219 Gastro-esophageal reflux disease without esophagitis: Secondary | ICD-10-CM | POA: Diagnosis not present

## 2015-04-22 DIAGNOSIS — R079 Chest pain, unspecified: Secondary | ICD-10-CM | POA: Insufficient documentation

## 2015-04-22 DIAGNOSIS — Z87442 Personal history of urinary calculi: Secondary | ICD-10-CM | POA: Insufficient documentation

## 2015-04-22 DIAGNOSIS — F329 Major depressive disorder, single episode, unspecified: Secondary | ICD-10-CM | POA: Insufficient documentation

## 2015-04-22 DIAGNOSIS — E876 Hypokalemia: Secondary | ICD-10-CM | POA: Insufficient documentation

## 2015-04-22 DIAGNOSIS — Z792 Long term (current) use of antibiotics: Secondary | ICD-10-CM | POA: Diagnosis not present

## 2015-04-22 LAB — I-STAT TROPONIN, ED
TROPONIN I, POC: 0.01 ng/mL (ref 0.00–0.08)
Troponin i, poc: 0.02 ng/mL (ref 0.00–0.08)

## 2015-04-22 LAB — CBC
HCT: 35.2 % — ABNORMAL LOW (ref 36.0–46.0)
Hemoglobin: 11.7 g/dL — ABNORMAL LOW (ref 12.0–15.0)
MCH: 31.9 pg (ref 26.0–34.0)
MCHC: 33.2 g/dL (ref 30.0–36.0)
MCV: 95.9 fL (ref 78.0–100.0)
PLATELETS: 317 10*3/uL (ref 150–400)
RBC: 3.67 MIL/uL — AB (ref 3.87–5.11)
RDW: 16.2 % — ABNORMAL HIGH (ref 11.5–15.5)
WBC: 5 10*3/uL (ref 4.0–10.5)

## 2015-04-22 LAB — PROTIME-INR
INR: 2.03 — AB (ref 0.00–1.49)
PROTHROMBIN TIME: 22.8 s — AB (ref 11.6–15.2)

## 2015-04-22 LAB — BASIC METABOLIC PANEL
Anion gap: 12 (ref 5–15)
BUN: 15 mg/dL (ref 6–20)
CALCIUM: 9.3 mg/dL (ref 8.9–10.3)
CO2: 22 mmol/L (ref 22–32)
CREATININE: 0.92 mg/dL (ref 0.44–1.00)
Chloride: 104 mmol/L (ref 101–111)
GFR calc non Af Amer: >60 mL/min (ref >60)
Glucose, Bld: 160 mg/dL — ABNORMAL HIGH (ref 65–99)
Potassium: 3.1 mmol/L — ABNORMAL LOW (ref 3.5–5.1)
SODIUM: 138 mmol/L (ref 135–145)

## 2015-04-22 LAB — BRAIN NATRIURETIC PEPTIDE: B Natriuretic Peptide: 160.6 pg/mL — ABNORMAL HIGH (ref 0.0–100.0)

## 2015-04-22 MED ORDER — IOHEXOL 350 MG/ML SOLN
75.0000 mL | Freq: Once | INTRAVENOUS | Status: AC | PRN
  Administered 2015-04-22: 75 mL via INTRAVENOUS

## 2015-04-22 MED ORDER — POTASSIUM CHLORIDE CRYS ER 20 MEQ PO TBCR
40.0000 meq | EXTENDED_RELEASE_TABLET | Freq: Once | ORAL | Status: AC
  Administered 2015-04-22: 40 meq via ORAL
  Filled 2015-04-22: qty 2

## 2015-04-22 MED ORDER — ASPIRIN 81 MG PO CHEW
324.0000 mg | CHEWABLE_TABLET | Freq: Once | ORAL | Status: AC
  Administered 2015-04-22: 324 mg via ORAL
  Filled 2015-04-22: qty 4

## 2015-04-22 NOTE — ED Notes (Signed)
Patient returned from Vascular 

## 2015-04-22 NOTE — Telephone Encounter (Signed)
To Dr. Radford Pax per patient.

## 2015-04-22 NOTE — ED Provider Notes (Signed)
CSN: [id number]     Arrival date & time 04/22/15  1117 History   First MD Initiated Contact with Patient 04/22/15 1605     Chief Complaint  Patient presents with  . Shortness of Breath     (Consider location/radiation/quality/duration/timing/severity/associated sxs/prior Treatment) HPI Comments: Right knee replacement Dr. Alvan Dame 8/15, 8/23 evaluated because  SOB at PT today at 11:30 Chest tightness briefly on arrival which resolved Came in for 2U of blood on 8/23, INR was 8 Yesterday stress Chest tightness in center, resolved spontaneously, 30 min     Patient is a 74 y.o. female presenting with shortness of breath.  Shortness of Breath Severity:  Mild Onset quality:  Sudden Duration:  4 hours Timing:  Constant Progression:  Unchanged Chronicity:  New Associated symptoms: chest pain (had catheterization 01/2014, [facility] with [facility])   Associated symptoms: no abdominal pain (30 min), no cough, no fever, no headaches, no neck pain, no rash, no sore throat, no syncope, no vomiting and no wheezing   Risk factors: hx of PE/DVT and recent surgery   Risk factors: no tobacco use   Risk factors comment:  +htn, no dm, no cholesterol   Past Medical History  Diagnosis Date  . PAF (paroxysmal atrial fibrillation)     a. 05/2011 Echo: EF 70%, triv MR, mild AI, mod TR, Gr 1 DD.  . Factor 5 Leiden mutation, heterozygous     a. chronic xarelto.  . Chest pain, unspecified     a. 12/2013 MV: EF 65%, no ischemia/infarct.  . Mitral valve prolapse   . Dysphagia   . GERD (gastroesophageal reflux disease)   . History of DVT & PE ~ 2000    S/P RLE knee scope  . Hypertension   . Breast cancer 2010    "left", s/p XRT  . Hypothyroidism   . H/O hiatal hernia   . Arthritis     "joints" (01/18/2014)  . Sleep deprivation     "for years and years" (01/18/2014)  . Family history of adverse reaction to anesthesia     mother developed irreg heart rate during surgery  . History of kidney stones    . Difficulty sleeping     dificulty staying asleep  . Depression   . Blood dyscrasia     factor 5 Leiden  . Sleep terrors    Past Surgical History  Procedure Laterality Date  . Tonsillectomy    . Knee arthroscopy Right ~ 2000  . Knee arthroscopy Left ~ 2009  . Abdominal hysterectomy  1983  . Dilation and curettage of uterus  1980's  . Tubal ligation  1980's  . Breast biopsy Right 2010  . Breast lumpectomy Left 2010  . Left heart catheterization with coronary angiogram N/A 01/19/2014    Procedure: LEFT HEART CATHETERIZATION WITH CORONARY ANGIOGRAM;  Surgeon: [name], MD;  [facility] C[facility] LAB;  Service: Cardiovascular;  Laterality: N/A;  . Total knee arthroplasty Right 03/25/2015    Procedure: RIGHT TOTAL KNEE ARTHROPLASTY;  Surgeon: Paralee Cancel, MD;  Location: [facility] ORS;  Service: Orthopedics;  Laterality: Right;   Family History  Problem Relation Age of Onset  . Heart attack Mother     "small heart attack" at 81, died @ 77.  . Other Father     died @ 2 following knee surgery  . Other Brother     alive and well.   Social History  Substance Use Topics  . Smoking status: Never Smoker   . Smokeless  tobacco: Never Used  . Alcohol Use: Yes     Comment: occasional   OB History    No data available     Review of Systems  Constitutional: Positive for activity change, appetite change and fatigue (since surgery). Negative for fever.  HENT: Negative for sore throat.   Eyes: Negative for visual disturbance.  Respiratory: Positive for shortness of breath. Negative for cough and wheezing.   Cardiovascular: Positive for chest pain (had catheterization 01/2014, [facility] with [facility]). Negative for syncope.  Gastrointestinal: Negative for nausea, vomiting, abdominal pain (30 min), diarrhea, constipation and blood in stool (black on iron).  Genitourinary: Negative for difficulty urinating.  Musculoskeletal: Negative for back pain and neck pain.  Skin: Negative for rash.   Neurological: Negative for syncope and headaches.      Allergies  Ciprofloxacin hcl  Home Medications   Prior to Admission medications   Medication Sig Start Date End Date Taking? Authorizing Provider  amLODipine (NORVASC) 2.5 MG tablet TAKE 1 TABLET BY MOUTH ONCE DAILY 03/25/15   Sueanne Margarita, MD  celecoxib (CELEBREX) 200 MG capsule Take 200 mg by mouth daily. Monday through Friday. Does not take on the weekend    Historical Provider, MD  ciprofloxacin (CIPRO) 500 MG tablet Take 1 tablet (500 mg total) by mouth 2 (two) times daily. 04/05/15   Danae Orleans, PA-C  desvenlafaxine (PRISTIQ) 50 MG 24 hr tablet Take 50 mg by mouth at bedtime.     Historical Provider, MD  docusate sodium (COLACE) 100 MG capsule Take 1 capsule (100 mg total) by mouth 2 (two) times daily. 03/26/15   Danae Orleans, PA-C  ferrous sulfate 325 (65 FE) MG tablet Take 1 tablet (325 mg total) by mouth 3 (three) times daily after meals. 03/26/15   Danae Orleans, PA-C  furosemide (LASIX) 20 MG tablet Take 20 mg by mouth 2 (two) times daily.    Historical Provider, MD  levothyroxine (SYNTHROID, LEVOTHROID) 75 MCG tablet Take 75 mcg by mouth daily before breakfast.    Historical Provider, MD  nebivolol (BYSTOLIC) 2.5 MG tablet Take 1 tablet (2.5 mg total) by mouth daily. 11/06/14   Sueanne Margarita, MD  polyethylene glycol (MIRALAX / GLYCOLAX) packet Take 17 g by mouth 2 (two) times daily. Patient taking differently: Take 17 g by mouth daily as needed for mild constipation or moderate constipation.  03/26/15   Danae Orleans, PA-C  tiZANidine (ZANAFLEX) 4 MG tablet Take 1 tablet (4 mg total) by mouth every 6 (six) hours as needed for muscle spasms. 03/26/15   Danae Orleans, PA-C  warfarin (COUMADIN) 5 MG tablet TAKE AS DIRECTED BY COUMADIN CLINIC Patient taking differently: Take 2.5-5 mg by mouth daily 03/28/15   Sueanne Margarita, MD  zolpidem (AMBIEN) 10 MG tablet Take 10 mg by mouth at bedtime as needed for sleep.  02/05/14    Historical Provider, MD   BP 133/55 mmHg  Pulse 55  Temp(Src) 98.1 F (36.7 C) (Oral)  Resp 16  SpO2 95% Physical Exam  Constitutional: She is oriented to person, place, and time. She appears well-developed and well-nourished. No distress.  HENT:  Head: Normocephalic and atraumatic.  Eyes: Conjunctivae and EOM are normal.  Neck: Normal range of motion.  Cardiovascular: Normal rate, regular rhythm, normal heart sounds and intact distal pulses.  Exam reveals no gallop and no friction rub.   No murmur heard. Pulmonary/Chest: Effort normal and breath sounds normal. No respiratory distress. She has no wheezes. She has no  rales.  Abdominal: Soft. She exhibits no distension. There is no tenderness. There is no guarding.  Musculoskeletal: She exhibits edema (2+ RLE, knee dark erythema (present since surgery per pt)). She exhibits no tenderness.  Neurological: She is alert and oriented to person, place, and time.  Skin: Skin is warm and dry. No rash noted. She is not diaphoretic. No erythema.  Nursing note and vitals reviewed.   ED Course  Procedures (including critical care time) Labs Review Labs Reviewed  BASIC METABOLIC PANEL - Abnormal; Notable for the following:    Potassium 3.1 (*)    Glucose, Bld 160 (*)    All other components within normal limits  CBC - Abnormal; Notable for the following:    RBC 3.67 (*)    Hemoglobin 11.7 (*)    HCT 35.2 (*)    RDW 16.2 (*)    All other components within normal limits  PROTIME-INR - Abnormal; Notable for the following:    Prothrombin Time 22.8 (*)    INR 2.03 (*)    All other components within normal limits  BRAIN NATRIURETIC PEPTIDE - Abnormal; Notable for the following:    B Natriuretic Peptide 160.6 (*)    All other components within normal limits  I-STAT TROPOININ, ED  Randolm Idol, ED    Imaging Review Dg Chest 2 View  04/22/2015   CLINICAL DATA:  Chest pain and shortness of breath during physical therapy this morning.   EXAM: CHEST  2 VIEW  COMPARISON:  01/18/2014  FINDINGS: The cardiac silhouette, mediastinal and hilar contours are within normal limits and stable. The lungs are clear. No pleural effusion or pneumothorax. The bony thorax is intact. Stable degenerative changes involving the mid thoracic spine.  IMPRESSION: No acute cardiopulmonary findings.   Electronically Signed   By: Marijo Sanes M.D.   On: 04/22/2015 13:24   Ct Angio Chest Pe W/cm &/or Wo Cm  04/22/2015   CLINICAL DATA:  Chest tightness. Shortness of breath. Knee surgery in August.  EXAM: CT ANGIOGRAPHY CHEST WITH CONTRAST  TECHNIQUE: Multidetector CT imaging of the chest was performed using the standard protocol during bolus administration of intravenous contrast. Multiplanar CT image reconstructions and MIPs were obtained to evaluate the vascular anatomy.  CONTRAST:  9mL OMNIPAQUE IOHEXOL 350 MG/ML SOLN  COMPARISON:  04/22/2015 chest radiograph  FINDINGS: Mediastinum/Nodes: No filling defect is identified in the pulmonary arterial tree to suggest pulmonary embolus. There is only very dilute contrast medium in the aorta, but no acute aortic findings are observed. Borderline cardiomegaly. No pericardial effusion. No pathologic thoracic adenopathy.  Lungs/Pleura: 5.8 by 3.5 cm left basilar bulla in the lower lobe, with some faint internal striations.  Upper abdomen: Unremarkable  Musculoskeletal: Thoracic spondylosis. No thoracic fracture or subluxation.  Review of the MIP images confirms the above findings.  IMPRESSION: 1. No filling defect is identified in the pulmonary arterial tree to suggest pulmonary embolus. 2. 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 3. Thoracic spondylosis.   Electronically Signed   By: Van Clines M.D.   On: 04/22/2015 18:29   I have personally reviewed and evaluated these images and lab results as part of my medical decision-making.   EKG Interpretation   Date/Time:  Monday April 22 2015 11:42:17  EDT Ventricular Rate:  69 PR Interval:  130 QRS Duration: 86 QT Interval:  404 QTC Calculation: 432 R Axis:   29 Text Interpretation:  Normal sinus rhythm Nonspecific ST abnormality  Abnormal ECG Similar to  prior EKG TW now up in lead III Confirmed by  [facility] MD, Tian Davison ([id number]) on 04/22/2015 5:11:10 PM      MDM   Final diagnoses:  Chest tightness  Shortness of breath  Hypokalemia   73yo female with History of paroxysmal atrial fibrillation, Factor V Leiden on coumadin 3 mos, DVT/PE 64yrs ago on coumadin, recent knee replacement 8/15 presents for concern of dyspnea, followed by episode of chest tightness.  Differential diagnosis for dyspnea includes ACS, PE, CHF exacerbation, anemia, pneumonia.  Chest x-ray was done which showed no sign of pneumonia or pneumothorax. EKG was evaluated by me which showed no acute findings. CT PE study done given pt high risk with history of factor V, DVT/PE, recent surgery and showed no sign of PE.  Pt with asymmetric leg swelling and DVT study also performed and normal.  Delta troponins negative, and discussed with Cardiology.  Given pt had cath 86yr ago showing mild-moderate disease, cardiology felt pt was appropriate for outpt evaluation.  Recommend follow up with her physician Dr. Fransico Him. Discussed results and plan with pt in detail. Patient discharged in stable condition with understanding of reasons to return.   Gareth Morgan, MD 04/23/15 [phone]

## 2015-04-22 NOTE — Discharge Instructions (Signed)

## 2015-04-22 NOTE — Telephone Encounter (Signed)
New message      Pt want Dr Radford Pax to know she is on her way to [place] with SOB.  She started having it at physical therapy this am (from knee replacement) and they told her to go to the ER.  Her husband is driving her.

## 2015-04-22 NOTE — ED Notes (Signed)
MD speaking with Husband in the room. Patient has not returned from Vascular.

## 2015-04-22 NOTE — ED Notes (Signed)
MD at the bedside  

## 2015-04-22 NOTE — ED Notes (Signed)
Patient states was at physical therapy this morning and experienced some shortness of breath.   Patient states she was told by her doctor to come in.  Patient states shortness of breath is resolved, but is having some chest tightness at this time.   Patient states total knee replacement on 08/15, but had to return to hospital 08/23 for blood transfusion.   Patient states that she "just hasn't felt good since".

## 2015-04-22 NOTE — Progress Notes (Signed)
VASCULAR LAB PRELIMINARY  PRELIMINARY  PRELIMINARY  PRELIMINARY  Right lower extremity venous duplex completed.    Preliminary report:  Right:  No evidence of DVT, superficial thrombosis, or Baker's cyst.  Jamie Coffey, RVS 04/22/2015, 7:37 PM

## 2015-04-22 NOTE — ED Notes (Signed)
Patient returned from CT

## 2015-04-24 DIAGNOSIS — M1711 Unilateral primary osteoarthritis, right knee: Secondary | ICD-10-CM | POA: Diagnosis not present

## 2015-04-24 DIAGNOSIS — Z96651 Presence of right artificial knee joint: Secondary | ICD-10-CM | POA: Diagnosis not present

## 2015-04-24 DIAGNOSIS — Z471 Aftercare following joint replacement surgery: Secondary | ICD-10-CM | POA: Diagnosis not present

## 2015-04-26 DIAGNOSIS — M1711 Unilateral primary osteoarthritis, right knee: Secondary | ICD-10-CM | POA: Diagnosis not present

## 2015-04-29 ENCOUNTER — Ambulatory Visit (INDEPENDENT_AMBULATORY_CARE_PROVIDER_SITE_OTHER): Payer: Medicare Other | Admitting: *Deleted

## 2015-04-29 DIAGNOSIS — Z5181 Encounter for therapeutic drug level monitoring: Secondary | ICD-10-CM | POA: Diagnosis not present

## 2015-04-29 DIAGNOSIS — Z86711 Personal history of pulmonary embolism: Secondary | ICD-10-CM | POA: Diagnosis not present

## 2015-04-29 DIAGNOSIS — M1711 Unilateral primary osteoarthritis, right knee: Secondary | ICD-10-CM | POA: Diagnosis not present

## 2015-04-29 DIAGNOSIS — D6851 Activated protein C resistance: Secondary | ICD-10-CM | POA: Diagnosis not present

## 2015-04-29 DIAGNOSIS — I480 Paroxysmal atrial fibrillation: Secondary | ICD-10-CM

## 2015-04-29 LAB — POCT INR: INR: 3.1

## 2015-04-30 DIAGNOSIS — R319 Hematuria, unspecified: Secondary | ICD-10-CM | POA: Diagnosis not present

## 2015-04-30 DIAGNOSIS — Z7901 Long term (current) use of anticoagulants: Secondary | ICD-10-CM | POA: Diagnosis not present

## 2015-04-30 DIAGNOSIS — I8290 Acute embolism and thrombosis of unspecified vein: Secondary | ICD-10-CM | POA: Diagnosis not present

## 2015-04-30 DIAGNOSIS — N390 Urinary tract infection, site not specified: Secondary | ICD-10-CM | POA: Diagnosis not present

## 2015-04-30 DIAGNOSIS — Z96651 Presence of right artificial knee joint: Secondary | ICD-10-CM | POA: Diagnosis not present

## 2015-04-30 DIAGNOSIS — Z6823 Body mass index (BMI) 23.0-23.9, adult: Secondary | ICD-10-CM | POA: Diagnosis not present

## 2015-04-30 DIAGNOSIS — I480 Paroxysmal atrial fibrillation: Secondary | ICD-10-CM | POA: Diagnosis not present

## 2015-04-30 DIAGNOSIS — E876 Hypokalemia: Secondary | ICD-10-CM | POA: Diagnosis not present

## 2015-05-01 DIAGNOSIS — M1711 Unilateral primary osteoarthritis, right knee: Secondary | ICD-10-CM | POA: Diagnosis not present

## 2015-05-03 DIAGNOSIS — M1711 Unilateral primary osteoarthritis, right knee: Secondary | ICD-10-CM | POA: Diagnosis not present

## 2015-05-06 DIAGNOSIS — M1711 Unilateral primary osteoarthritis, right knee: Secondary | ICD-10-CM | POA: Diagnosis not present

## 2015-05-07 ENCOUNTER — Ambulatory Visit (INDEPENDENT_AMBULATORY_CARE_PROVIDER_SITE_OTHER): Payer: Medicare Other | Admitting: *Deleted

## 2015-05-07 DIAGNOSIS — Z5181 Encounter for therapeutic drug level monitoring: Secondary | ICD-10-CM

## 2015-05-07 DIAGNOSIS — Z86711 Personal history of pulmonary embolism: Secondary | ICD-10-CM

## 2015-05-07 DIAGNOSIS — D6851 Activated protein C resistance: Secondary | ICD-10-CM

## 2015-05-07 DIAGNOSIS — I480 Paroxysmal atrial fibrillation: Secondary | ICD-10-CM

## 2015-05-07 LAB — POCT INR: INR: 2.3

## 2015-05-08 DIAGNOSIS — M1711 Unilateral primary osteoarthritis, right knee: Secondary | ICD-10-CM | POA: Diagnosis not present

## 2015-05-08 DIAGNOSIS — Z471 Aftercare following joint replacement surgery: Secondary | ICD-10-CM | POA: Diagnosis not present

## 2015-05-08 DIAGNOSIS — Z96651 Presence of right artificial knee joint: Secondary | ICD-10-CM | POA: Diagnosis not present

## 2015-05-13 DIAGNOSIS — M1711 Unilateral primary osteoarthritis, right knee: Secondary | ICD-10-CM | POA: Diagnosis not present

## 2015-05-14 ENCOUNTER — Ambulatory Visit (INDEPENDENT_AMBULATORY_CARE_PROVIDER_SITE_OTHER): Payer: Medicare Other | Admitting: *Deleted

## 2015-05-14 DIAGNOSIS — Z5181 Encounter for therapeutic drug level monitoring: Secondary | ICD-10-CM | POA: Diagnosis not present

## 2015-05-14 DIAGNOSIS — D6851 Activated protein C resistance: Secondary | ICD-10-CM | POA: Diagnosis not present

## 2015-05-14 DIAGNOSIS — I480 Paroxysmal atrial fibrillation: Secondary | ICD-10-CM

## 2015-05-14 DIAGNOSIS — Z86711 Personal history of pulmonary embolism: Secondary | ICD-10-CM

## 2015-05-14 LAB — POCT INR: INR: 3.9

## 2015-05-15 DIAGNOSIS — Z96651 Presence of right artificial knee joint: Secondary | ICD-10-CM | POA: Diagnosis not present

## 2015-05-15 DIAGNOSIS — Z471 Aftercare following joint replacement surgery: Secondary | ICD-10-CM | POA: Diagnosis not present

## 2015-05-15 DIAGNOSIS — M1711 Unilateral primary osteoarthritis, right knee: Secondary | ICD-10-CM | POA: Diagnosis not present

## 2015-05-21 ENCOUNTER — Ambulatory Visit (INDEPENDENT_AMBULATORY_CARE_PROVIDER_SITE_OTHER): Payer: Medicare Other | Admitting: *Deleted

## 2015-05-21 DIAGNOSIS — Z86711 Personal history of pulmonary embolism: Secondary | ICD-10-CM

## 2015-05-21 DIAGNOSIS — D6851 Activated protein C resistance: Secondary | ICD-10-CM | POA: Diagnosis not present

## 2015-05-21 DIAGNOSIS — Z5181 Encounter for therapeutic drug level monitoring: Secondary | ICD-10-CM

## 2015-05-21 DIAGNOSIS — I480 Paroxysmal atrial fibrillation: Secondary | ICD-10-CM | POA: Diagnosis not present

## 2015-05-21 LAB — POCT INR: INR: 2.2

## 2015-05-22 DIAGNOSIS — M1711 Unilateral primary osteoarthritis, right knee: Secondary | ICD-10-CM | POA: Diagnosis not present

## 2015-05-23 DIAGNOSIS — Z471 Aftercare following joint replacement surgery: Secondary | ICD-10-CM | POA: Diagnosis not present

## 2015-05-23 DIAGNOSIS — Z96651 Presence of right artificial knee joint: Secondary | ICD-10-CM | POA: Diagnosis not present

## 2015-05-25 DIAGNOSIS — E0590 Thyrotoxicosis, unspecified without thyrotoxic crisis or storm: Secondary | ICD-10-CM | POA: Diagnosis not present

## 2015-05-25 DIAGNOSIS — Z87440 Personal history of urinary (tract) infections: Secondary | ICD-10-CM | POA: Diagnosis not present

## 2015-05-25 DIAGNOSIS — T814XXA Infection following a procedure, initial encounter: Secondary | ICD-10-CM | POA: Diagnosis not present

## 2015-05-25 DIAGNOSIS — Z96651 Presence of right artificial knee joint: Secondary | ICD-10-CM | POA: Diagnosis not present

## 2015-05-25 DIAGNOSIS — I4891 Unspecified atrial fibrillation: Secondary | ICD-10-CM | POA: Diagnosis not present

## 2015-05-25 DIAGNOSIS — I10 Essential (primary) hypertension: Secondary | ICD-10-CM | POA: Diagnosis not present

## 2015-05-27 ENCOUNTER — Encounter: Payer: Self-pay | Admitting: Cardiology

## 2015-05-27 DIAGNOSIS — I351 Nonrheumatic aortic (valve) insufficiency: Secondary | ICD-10-CM

## 2015-05-27 DIAGNOSIS — T814XXA Infection following a procedure, initial encounter: Secondary | ICD-10-CM | POA: Diagnosis not present

## 2015-05-27 DIAGNOSIS — Z96651 Presence of right artificial knee joint: Secondary | ICD-10-CM | POA: Diagnosis not present

## 2015-05-27 DIAGNOSIS — R310 Gross hematuria: Secondary | ICD-10-CM | POA: Diagnosis not present

## 2015-05-27 DIAGNOSIS — Z87440 Personal history of urinary (tract) infections: Secondary | ICD-10-CM | POA: Diagnosis not present

## 2015-05-27 DIAGNOSIS — N3020 Other chronic cystitis without hematuria: Secondary | ICD-10-CM | POA: Diagnosis not present

## 2015-05-27 DIAGNOSIS — N200 Calculus of kidney: Secondary | ICD-10-CM | POA: Diagnosis not present

## 2015-05-27 DIAGNOSIS — I10 Essential (primary) hypertension: Secondary | ICD-10-CM | POA: Diagnosis not present

## 2015-05-27 DIAGNOSIS — I4891 Unspecified atrial fibrillation: Secondary | ICD-10-CM | POA: Diagnosis not present

## 2015-05-27 DIAGNOSIS — E0590 Thyrotoxicosis, unspecified without thyrotoxic crisis or storm: Secondary | ICD-10-CM | POA: Diagnosis not present

## 2015-05-27 HISTORY — DX: Nonrheumatic aortic (valve) insufficiency: I35.1

## 2015-05-27 NOTE — Progress Notes (Signed)
Cardiology Office Note   Date:  05/28/2015   ID:  Jamie Coffey, DOB Jun 29, 1941, MRN [id number]  PCP:  [name], MD    Chief Complaint  Patient presents with  . Atrial Fibrillation  . Hypertension      History of Present Illness: Jamie Coffey is a 74 y.o. female with a history of PAF, HTN, MVP and diastolic dysfunction who presents today for followup. She denies any chest pain, LE edema, dizziness (except for when she turns fast), palpitations or syncope. She has noticed some DOE on occasion when walking but it does not occur daily. She notices it more if she has not slept well the night before. She has had some bruising on her legs which is new for her.      Past Medical History  Diagnosis Date  . PAF (paroxysmal atrial fibrillation) ([place])     a. 05/2011 Echo: EF 70%, triv MR, mild AI, mod TR, Gr 1 DD.  . Factor 5 Leiden mutation, heterozygous ([place])     a. chronic xarelto.  . Chest pain, unspecified     a. 12/2013 MV: EF 65%, no ischemia/infarct.  . Mitral valve prolapse   . Dysphagia   . GERD (gastroesophageal reflux disease)   . History of DVT & PE ~ 2000    S/P RLE knee scope  . Hypertension   . Breast cancer ([place]) 2010    "left", s/p XRT  . Hypothyroidism   . H/O hiatal hernia   . Arthritis     "joints" (01/18/2014)  . Sleep deprivation     "for years and years" (01/18/2014)  . Family history of adverse reaction to anesthesia     mother developed irreg heart rate during surgery  . History of kidney stones   . Difficulty sleeping     dificulty staying asleep  . Depression   . Blood dyscrasia     factor 5 Leiden  . Sleep terrors   . Aortic valve insufficiency 05/27/2015    Moderate by echo    Past Surgical History  Procedure Laterality Date  . Tonsillectomy    . Knee arthroscopy Right ~ 2000  . Knee arthroscopy Left ~ 2009  . Abdominal hysterectomy  1983  . Dilation and curettage of uterus  1980's  . Tubal  ligation  1980's  . Breast biopsy Right 2010  . Breast lumpectomy Left 2010  . Left heart catheterization with coronary angiogram N/A 01/19/2014    Procedure: LEFT HEART CATHETERIZATION WITH CORONARY ANGIOGRAM;  Surgeon: [name], MD;  Location: [facility] CATH LAB;  Service: Cardiovascular;  Laterality: N/A;  . Total knee arthroplasty Right 03/25/2015    Procedure: RIGHT TOTAL KNEE ARTHROPLASTY;  Surgeon: Paralee Cancel, MD;  Location: [facility] ORS;  Service: Orthopedics;  Laterality: Right;     Current Outpatient Prescriptions  Medication Sig Dispense Refill  . amLODipine (NORVASC) 2.5 MG tablet TAKE 1 TABLET BY MOUTH ONCE DAILY 30 tablet 3  . celecoxib (CELEBREX) 200 MG capsule Take 200 mg by mouth daily. Monday through Friday. Does not take on the weekend    . desvenlafaxine (PRISTIQ) 50 MG 24 hr tablet Take 50 mg by mouth at bedtime.     Marland Kitchen doxycycline (ADOXA) 100 MG tablet Take 100 mg by mouth 2 (two) times daily.  0  . levothyroxine (SYNTHROID, LEVOTHROID) 75 MCG tablet Take 75 mcg by mouth daily before  breakfast.    . nebivolol (BYSTOLIC) 2.5 MG tablet Take 1 tablet (2.5 mg total) by mouth daily. 30 tablet 6  . tiZANidine (ZANAFLEX) 4 MG tablet Take 1 tablet (4 mg total) by mouth every 6 (six) hours as needed for muscle spasms. 40 tablet 0  . venlafaxine XR (EFFEXOR-XR) 75 MG 24 hr capsule Take 75 mg by mouth daily.  2  . warfarin (COUMADIN) 5 MG tablet TAKE AS DIRECTED BY COUMADIN CLINIC (Patient taking differently: Take 2.5-5 mg by mouth daily) 30 tablet 3  . zolpidem (AMBIEN) 10 MG tablet Take 10 mg by mouth at bedtime as needed for sleep.      No current facility-administered medications for this visit.    Allergies:   Ciprofloxacin hcl    Social History:  The patient  reports that she has never smoked. She has never used smokeless tobacco. She reports that she drinks alcohol. She reports that she does not use illicit drugs.   Family History:  The patient's family history includes  Heart attack in her mother; Other in her brother and father.    ROS:  Please see the history of present illness.   Otherwise, review of systems are positive for none.   All other systems are reviewed and negative.    PHYSICAL EXAM: VS:  BP 108/60 mmHg  Pulse 63  Ht 5\' 6"  (1.676 m)  Wt 142 lb 6.4 oz (64.592 kg)  BMI 22.99 kg/m2  SpO2 98% , BMI Body mass index is 22.99 kg/(m^2). GEN: Well nourished, well developed, in no acute distress HEENT: normal Neck: no JVD, carotid bruits, or masses Cardiac: RRR; no murmurs, rubs, or gallops,no edema  Respiratory:  clear to auscultation bilaterally, normal work of breathing GI: soft, nontender, nondistended, + BS MS: no deformity or atrophy Skin: warm and dry, no rash Neuro:  Strength and sensation are intact Psych: euthymic mood, full affect   EKG:  EKG was not ordered today.    Recent Labs: 08/27/2014: TSH 1.27 04/22/2015: B Natriuretic Peptide 160.6*; BUN 15; Creatinine, Ser 0.92; Hemoglobin 11.7*; Platelets 317; Potassium 3.1*; Sodium 138    Lipid Panel    Component Value Date/Time   CHOL 198 01/19/2014 0347   TRIG 40 01/19/2014 0347   HDL 105 01/19/2014 0347   CHOLHDL 1.9 01/19/2014 0347   VLDL 8 01/19/2014 0347   LDLCALC 85 01/19/2014 0347      Wt Readings from Last 3 Encounters:  05/28/15 142 lb 6.4 oz (64.592 kg)  04/03/15 153 lb (69.4 kg)  03/25/15 151 lb (68.493 kg)     ASSESSMENT AND PLAN:  1. PAF maintaining NSR  -continue warfarin/Bystolic 2. HTN well controlled - continue amlodipine/Bystolic  3. Abnormal EKG with nonspecific ST/T wave abnormality in the inferolateral leads with no ischemia on nuclear stress test   4. Moderate AI - recheck 2D echo in 6 month   5.  Very mild pulmonary HTN     Current medicines are reviewed at length with the patient today.  The patient does not have concerns regarding medicines.  The following changes have been made:  no change  Labs/ tests ordered today: See  above Assessment and Plan No orders of the defined types were placed in this encounter.     Disposition:   FU with me in 6 months  Signed, Sueanne Margarita, MD  05/28/2015 8:41 AM    [facility], [place], [place]  [address] Phone: [phone]; Fax: [phone]    

## 2015-05-28 ENCOUNTER — Ambulatory Visit (INDEPENDENT_AMBULATORY_CARE_PROVIDER_SITE_OTHER): Payer: Medicare Other | Admitting: *Deleted

## 2015-05-28 ENCOUNTER — Encounter: Payer: Self-pay | Admitting: Cardiology

## 2015-05-28 ENCOUNTER — Ambulatory Visit (INDEPENDENT_AMBULATORY_CARE_PROVIDER_SITE_OTHER): Payer: Medicare Other | Admitting: Cardiology

## 2015-05-28 VITALS — BP 108/60 | HR 63 | Ht 66.0 in | Wt 142.4 lb

## 2015-05-28 DIAGNOSIS — D6851 Activated protein C resistance: Secondary | ICD-10-CM | POA: Diagnosis not present

## 2015-05-28 DIAGNOSIS — Z86711 Personal history of pulmonary embolism: Secondary | ICD-10-CM

## 2015-05-28 DIAGNOSIS — I480 Paroxysmal atrial fibrillation: Secondary | ICD-10-CM | POA: Diagnosis not present

## 2015-05-28 DIAGNOSIS — I351 Nonrheumatic aortic (valve) insufficiency: Secondary | ICD-10-CM | POA: Diagnosis not present

## 2015-05-28 DIAGNOSIS — R0602 Shortness of breath: Secondary | ICD-10-CM | POA: Diagnosis not present

## 2015-05-28 DIAGNOSIS — I10 Essential (primary) hypertension: Secondary | ICD-10-CM | POA: Diagnosis not present

## 2015-05-28 DIAGNOSIS — Z5181 Encounter for therapeutic drug level monitoring: Secondary | ICD-10-CM | POA: Diagnosis not present

## 2015-05-28 LAB — POCT INR: INR: 1.7

## 2015-05-28 NOTE — Patient Instructions (Signed)
Medication Instructions:  Your physician recommends that you continue on your current medications as directed. Please refer to the Current Medication list given to you today.   Labwork: None  Testing/Procedures: Your physician has requested that you have an echocardiogram. Echocardiography is a painless test that uses sound waves to create images of your heart. It provides your doctor with information about the size and shape of your heart and how well your heart's chambers and valves are working. This procedure takes approximately one hour. There are no restrictions for this procedure.  Follow-Up: Your physician wants you to follow-up in: 6 months with Dr. Turner. You will receive a reminder letter in the mail two months in advance. If you don't receive a letter, please call our office to schedule the follow-up appointment.   Any Other Special Instructions Will Be Listed Below (If Applicable).   

## 2015-05-29 DIAGNOSIS — R310 Gross hematuria: Secondary | ICD-10-CM | POA: Diagnosis not present

## 2015-05-29 DIAGNOSIS — Z87440 Personal history of urinary (tract) infections: Secondary | ICD-10-CM | POA: Diagnosis not present

## 2015-05-29 DIAGNOSIS — E0590 Thyrotoxicosis, unspecified without thyrotoxic crisis or storm: Secondary | ICD-10-CM | POA: Diagnosis not present

## 2015-05-29 DIAGNOSIS — I10 Essential (primary) hypertension: Secondary | ICD-10-CM | POA: Diagnosis not present

## 2015-05-29 DIAGNOSIS — N200 Calculus of kidney: Secondary | ICD-10-CM | POA: Diagnosis not present

## 2015-05-29 DIAGNOSIS — T814XXA Infection following a procedure, initial encounter: Secondary | ICD-10-CM | POA: Diagnosis not present

## 2015-05-29 DIAGNOSIS — Z96651 Presence of right artificial knee joint: Secondary | ICD-10-CM | POA: Diagnosis not present

## 2015-05-29 DIAGNOSIS — I4891 Unspecified atrial fibrillation: Secondary | ICD-10-CM | POA: Diagnosis not present

## 2015-05-30 DIAGNOSIS — L218 Other seborrheic dermatitis: Secondary | ICD-10-CM | POA: Diagnosis not present

## 2015-05-31 ENCOUNTER — Other Ambulatory Visit: Payer: Self-pay | Admitting: Urology

## 2015-05-31 DIAGNOSIS — Z96651 Presence of right artificial knee joint: Secondary | ICD-10-CM | POA: Diagnosis not present

## 2015-05-31 DIAGNOSIS — E0590 Thyrotoxicosis, unspecified without thyrotoxic crisis or storm: Secondary | ICD-10-CM | POA: Diagnosis not present

## 2015-05-31 DIAGNOSIS — I10 Essential (primary) hypertension: Secondary | ICD-10-CM | POA: Diagnosis not present

## 2015-05-31 DIAGNOSIS — I4891 Unspecified atrial fibrillation: Secondary | ICD-10-CM | POA: Diagnosis not present

## 2015-05-31 DIAGNOSIS — Z87440 Personal history of urinary (tract) infections: Secondary | ICD-10-CM | POA: Diagnosis not present

## 2015-05-31 DIAGNOSIS — R1900 Intra-abdominal and pelvic swelling, mass and lump, unspecified site: Secondary | ICD-10-CM

## 2015-05-31 DIAGNOSIS — T814XXA Infection following a procedure, initial encounter: Secondary | ICD-10-CM | POA: Diagnosis not present

## 2015-06-03 DIAGNOSIS — E0590 Thyrotoxicosis, unspecified without thyrotoxic crisis or storm: Secondary | ICD-10-CM | POA: Diagnosis not present

## 2015-06-03 DIAGNOSIS — T814XXA Infection following a procedure, initial encounter: Secondary | ICD-10-CM | POA: Diagnosis not present

## 2015-06-03 DIAGNOSIS — Z96651 Presence of right artificial knee joint: Secondary | ICD-10-CM | POA: Diagnosis not present

## 2015-06-03 DIAGNOSIS — Z87440 Personal history of urinary (tract) infections: Secondary | ICD-10-CM | POA: Diagnosis not present

## 2015-06-03 DIAGNOSIS — I4891 Unspecified atrial fibrillation: Secondary | ICD-10-CM | POA: Diagnosis not present

## 2015-06-03 DIAGNOSIS — I10 Essential (primary) hypertension: Secondary | ICD-10-CM | POA: Diagnosis not present

## 2015-06-04 ENCOUNTER — Telehealth (HOSPITAL_COMMUNITY): Payer: Self-pay

## 2015-06-04 ENCOUNTER — Ambulatory Visit (INDEPENDENT_AMBULATORY_CARE_PROVIDER_SITE_OTHER): Payer: Medicare Other

## 2015-06-04 DIAGNOSIS — D6851 Activated protein C resistance: Secondary | ICD-10-CM

## 2015-06-04 DIAGNOSIS — Z5181 Encounter for therapeutic drug level monitoring: Secondary | ICD-10-CM

## 2015-06-04 DIAGNOSIS — I480 Paroxysmal atrial fibrillation: Secondary | ICD-10-CM

## 2015-06-04 DIAGNOSIS — Z86711 Personal history of pulmonary embolism: Secondary | ICD-10-CM

## 2015-06-04 LAB — POCT INR: INR: 2.8

## 2015-06-05 DIAGNOSIS — T814XXA Infection following a procedure, initial encounter: Secondary | ICD-10-CM | POA: Diagnosis not present

## 2015-06-05 DIAGNOSIS — Z96651 Presence of right artificial knee joint: Secondary | ICD-10-CM | POA: Diagnosis not present

## 2015-06-05 DIAGNOSIS — E0590 Thyrotoxicosis, unspecified without thyrotoxic crisis or storm: Secondary | ICD-10-CM | POA: Diagnosis not present

## 2015-06-05 DIAGNOSIS — Z87440 Personal history of urinary (tract) infections: Secondary | ICD-10-CM | POA: Diagnosis not present

## 2015-06-05 DIAGNOSIS — I10 Essential (primary) hypertension: Secondary | ICD-10-CM | POA: Diagnosis not present

## 2015-06-05 DIAGNOSIS — I4891 Unspecified atrial fibrillation: Secondary | ICD-10-CM | POA: Diagnosis not present

## 2015-06-06 DIAGNOSIS — Z471 Aftercare following joint replacement surgery: Secondary | ICD-10-CM | POA: Diagnosis not present

## 2015-06-06 DIAGNOSIS — Z96651 Presence of right artificial knee joint: Secondary | ICD-10-CM | POA: Diagnosis not present

## 2015-06-07 ENCOUNTER — Other Ambulatory Visit: Payer: Self-pay | Admitting: Radiology

## 2015-06-07 DIAGNOSIS — E0590 Thyrotoxicosis, unspecified without thyrotoxic crisis or storm: Secondary | ICD-10-CM | POA: Diagnosis not present

## 2015-06-07 DIAGNOSIS — T814XXA Infection following a procedure, initial encounter: Secondary | ICD-10-CM | POA: Diagnosis not present

## 2015-06-07 DIAGNOSIS — Z96651 Presence of right artificial knee joint: Secondary | ICD-10-CM | POA: Diagnosis not present

## 2015-06-07 DIAGNOSIS — I4891 Unspecified atrial fibrillation: Secondary | ICD-10-CM | POA: Diagnosis not present

## 2015-06-07 DIAGNOSIS — Z87440 Personal history of urinary (tract) infections: Secondary | ICD-10-CM | POA: Diagnosis not present

## 2015-06-07 DIAGNOSIS — I10 Essential (primary) hypertension: Secondary | ICD-10-CM | POA: Diagnosis not present

## 2015-06-10 ENCOUNTER — Ambulatory Visit (HOSPITAL_COMMUNITY): Payer: Medicare Other

## 2015-06-10 ENCOUNTER — Ambulatory Visit (HOSPITAL_COMMUNITY)
Admission: RE | Admit: 2015-06-10 | Discharge: 2015-06-10 | Disposition: A | Discharge status: Home / Self Care | Payer: Medicare Other | Source: Ambulatory Visit | Attending: Urology | Admitting: Urology

## 2015-06-10 ENCOUNTER — Encounter (HOSPITAL_COMMUNITY): Payer: Self-pay

## 2015-06-10 DIAGNOSIS — Z86718 Personal history of other venous thrombosis and embolism: Secondary | ICD-10-CM | POA: Diagnosis not present

## 2015-06-10 DIAGNOSIS — Z96651 Presence of right artificial knee joint: Secondary | ICD-10-CM | POA: Diagnosis not present

## 2015-06-10 DIAGNOSIS — E0590 Thyrotoxicosis, unspecified without thyrotoxic crisis or storm: Secondary | ICD-10-CM | POA: Diagnosis not present

## 2015-06-10 DIAGNOSIS — D649 Anemia, unspecified: Secondary | ICD-10-CM | POA: Insufficient documentation

## 2015-06-10 DIAGNOSIS — Z7901 Long term (current) use of anticoagulants: Secondary | ICD-10-CM | POA: Insufficient documentation

## 2015-06-10 DIAGNOSIS — Z853 Personal history of malignant neoplasm of breast: Secondary | ICD-10-CM | POA: Insufficient documentation

## 2015-06-10 DIAGNOSIS — T814XXA Infection following a procedure, initial encounter: Secondary | ICD-10-CM | POA: Diagnosis not present

## 2015-06-10 DIAGNOSIS — I10 Essential (primary) hypertension: Secondary | ICD-10-CM | POA: Diagnosis not present

## 2015-06-10 DIAGNOSIS — E039 Hypothyroidism, unspecified: Secondary | ICD-10-CM | POA: Diagnosis not present

## 2015-06-10 DIAGNOSIS — R188 Other ascites: Secondary | ICD-10-CM | POA: Insufficient documentation

## 2015-06-10 DIAGNOSIS — Z86711 Personal history of pulmonary embolism: Secondary | ICD-10-CM | POA: Insufficient documentation

## 2015-06-10 DIAGNOSIS — Z79899 Other long term (current) drug therapy: Secondary | ICD-10-CM | POA: Insufficient documentation

## 2015-06-10 DIAGNOSIS — R1900 Intra-abdominal and pelvic swelling, mass and lump, unspecified site: Secondary | ICD-10-CM | POA: Insufficient documentation

## 2015-06-10 DIAGNOSIS — Z923 Personal history of irradiation: Secondary | ICD-10-CM | POA: Diagnosis not present

## 2015-06-10 DIAGNOSIS — K219 Gastro-esophageal reflux disease without esophagitis: Secondary | ICD-10-CM | POA: Insufficient documentation

## 2015-06-10 DIAGNOSIS — I480 Paroxysmal atrial fibrillation: Secondary | ICD-10-CM | POA: Insufficient documentation

## 2015-06-10 DIAGNOSIS — R791 Abnormal coagulation profile: Secondary | ICD-10-CM | POA: Diagnosis not present

## 2015-06-10 DIAGNOSIS — Z87442 Personal history of urinary calculi: Secondary | ICD-10-CM | POA: Insufficient documentation

## 2015-06-10 DIAGNOSIS — D6851 Activated protein C resistance: Secondary | ICD-10-CM | POA: Diagnosis not present

## 2015-06-10 DIAGNOSIS — Z87440 Personal history of urinary (tract) infections: Secondary | ICD-10-CM | POA: Diagnosis not present

## 2015-06-10 DIAGNOSIS — I4891 Unspecified atrial fibrillation: Secondary | ICD-10-CM | POA: Diagnosis not present

## 2015-06-10 LAB — CBC
HEMATOCRIT: 38.3 % (ref 36.0–46.0)
HEMOGLOBIN: 12.5 g/dL (ref 12.0–15.0)
MCH: 30.3 pg (ref 26.0–34.0)
MCHC: 32.6 g/dL (ref 30.0–36.0)
MCV: 93 fL (ref 78.0–100.0)
Platelets: 210 10*3/uL (ref 150–400)
RBC: 4.12 MIL/uL (ref 3.87–5.11)
RDW: 13.3 % (ref 11.5–15.5)
WBC: 4.4 10*3/uL (ref 4.0–10.5)

## 2015-06-10 LAB — PROTIME-INR
INR: 1.04 (ref 0.00–1.49)
Prothrombin Time: 13.8 seconds (ref 11.6–15.2)

## 2015-06-10 LAB — APTT: APTT: 26 s (ref 24–37)

## 2015-06-10 MED ORDER — FENTANYL CITRATE (PF) 100 MCG/2ML IJ SOLN
INTRAMUSCULAR | Status: AC | PRN
  Administered 2015-06-10: 50 ug via INTRAVENOUS

## 2015-06-10 MED ORDER — MIDAZOLAM HCL 2 MG/2ML IJ SOLN
INTRAMUSCULAR | Status: AC | PRN
  Administered 2015-06-10: 1 mg via INTRAVENOUS

## 2015-06-10 MED ORDER — SODIUM CHLORIDE 0.9 % IV SOLN
Freq: Once | INTRAVENOUS | Status: AC
  Administered 2015-06-10: 07:00:00 via INTRAVENOUS

## 2015-06-10 MED ORDER — FENTANYL CITRATE (PF) 100 MCG/2ML IJ SOLN
INTRAMUSCULAR | Status: AC
  Filled 2015-06-10: qty 4

## 2015-06-10 MED ORDER — MIDAZOLAM HCL 2 MG/2ML IJ SOLN
INTRAMUSCULAR | Status: AC
  Filled 2015-06-10: qty 6

## 2015-06-10 NOTE — Procedures (Signed)
Technically successful CT guided placement of a temporary 8 Fr drainage catheter placement into the right lower quadrant hematoma yielding a total of 50 cc of dark red viscous non foul smelling blood products.  The drainage catheter was removed immediately following placement All aspirated samples sent to the laboratory for analysis.   No immediate post procedural complications.   Ronny Bacon, MD Pager #: [phone]

## 2015-06-10 NOTE — H&P (Signed)
Chief Complaint: Patient was seen in consultation today for CT guided aspiration of right pelvic /rectus sheath fluid collections  Referring Physician(s): Grapey,David  History of Present Illness: Jamie Coffey is a 74 y.o. female with past medical history significant for PAF, factor V Leiden mutation, on Coumadin, prior DVT/PE, breast cancer 2010, prior nephrolithiasis, and right total knee arthroplasty in August of this year. She has had recent history of hematuria and subsequent CT abd/pelvis at [facility] which revealed right pelvic/rectus sheath fluid collections. She presents today for CT-guided aspiration of the above mentioned fluid collections.  Past Medical History  Diagnosis Date  . PAF (paroxysmal atrial fibrillation) ([place])     a. 05/2011 Echo: EF 70%, triv MR, mild AI, mod TR, Gr 1 DD.  . Factor 5 Leiden mutation, heterozygous ([place])     a. chronic xarelto.  . Chest pain, unspecified     a. 12/2013 MV: EF 65%, no ischemia/infarct.  . Mitral valve prolapse   . Dysphagia   . GERD (gastroesophageal reflux disease)   . History of DVT & PE ~ 2000    S/P RLE knee scope  . Hypertension   . Breast cancer ([place]) 2010    "left", s/p XRT  . Hypothyroidism   . H/O hiatal hernia   . Arthritis     "joints" (01/18/2014)  . Sleep deprivation     "for years and years" (01/18/2014)  . Family history of adverse reaction to anesthesia     mother developed irreg heart rate during surgery  . History of kidney stones   . Difficulty sleeping     dificulty staying asleep  . Depression   . Blood dyscrasia     factor 5 Leiden  . Sleep terrors   . Aortic valve insufficiency 05/27/2015    Moderate by echo    Past Surgical History  Procedure Laterality Date  . Tonsillectomy    . Knee arthroscopy Right ~ 2000  . Knee arthroscopy Left ~ 2009  . Abdominal hysterectomy  1983  . Dilation and curettage of uterus  1980's  . Tubal ligation  1980's  . Breast biopsy Right 2010  .  Breast lumpectomy Left 2010  . Left heart catheterization with coronary angiogram N/A 01/19/2014    Procedure: LEFT HEART CATHETERIZATION WITH CORONARY ANGIOGRAM;  Surgeon: [name], MD;  Location: [facility] CATH LAB;  Service: Cardiovascular;  Laterality: N/A;  . Total knee arthroplasty Right 03/25/2015    Procedure: RIGHT TOTAL KNEE ARTHROPLASTY;  Surgeon: Paralee Cancel, MD;  Location: [facility] ORS;  Service: Orthopedics;  Laterality: Right;    Allergies: Ciprofloxacin hcl  Medications: Prior to Admission medications   Medication Sig Start Date End Date Taking? Authorizing Provider  amLODipine (NORVASC) 2.5 MG tablet TAKE 1 TABLET BY MOUTH ONCE DAILY 03/25/15  Yes Sueanne Margarita, MD  celecoxib (CELEBREX) 200 MG capsule Take 200 mg by mouth daily as needed for mild pain.    Yes Historical Provider, MD  doxycycline (ADOXA) 100 MG tablet Take 100 mg by mouth 2 (two) times daily. 05/23/15  Yes Historical Provider, MD  levothyroxine (SYNTHROID, LEVOTHROID) 75 MCG tablet Take 75 mcg by mouth daily before breakfast.   Yes Historical Provider, MD  nebivolol (BYSTOLIC) 2.5 MG tablet Take 1 tablet (2.5 mg total) by mouth daily. 11/06/14  Yes Sueanne Margarita, MD  venlafaxine XR (EFFEXOR-XR) 75 MG 24 hr capsule Take 75 mg by mouth at bedtime.  05/16/15  Yes Historical Provider, MD  warfarin (COUMADIN) 5 MG tablet TAKE AS DIRECTED BY COUMADIN CLINIC Patient taking differently: Take 2.5-5 mg by mouth daily 03/28/15  Yes Sueanne Margarita, MD  tiZANidine (ZANAFLEX) 4 MG tablet Take 1 tablet (4 mg total) by mouth every 6 (six) hours as needed for muscle spasms. Patient not taking: Reported on 06/06/2015 03/26/15   Danae Orleans, PA-C  zolpidem (AMBIEN) 10 MG tablet Take 10 mg by mouth at bedtime as needed for sleep.  02/05/14   Historical Provider, MD     Family History  Problem Relation Age of Onset  . Heart attack Mother     "small heart attack" at 54, died @ 60.  . Other Father     died @ 18 following knee surgery   . Other Brother     alive and well.    Social History   Social History  . Marital Status: Married    Spouse Name: N/A  . Number of Children: N/A  . Years of Education: N/A   Social History Main Topics  . Smoking status: Never Smoker   . Smokeless tobacco: Never Used  . Alcohol Use: Yes     Comment: occasional  . Drug Use: No  . Sexual Activity: Not Asked   Other Topics Concern  . None   Social History Narrative   Lives in [place] with husband.  Retired.  Walks up to 4 days/wk - 15 min miles.      Review of Systems  Constitutional: Negative for fever and chills.  Respiratory: Negative for cough and shortness of breath.   Cardiovascular: Negative for chest pain.  Gastrointestinal: Negative for nausea, vomiting and blood in stool.       Occ abd pain  Genitourinary: Negative for dysuria.       Recent hematuria  Musculoskeletal: Negative for back pain.  Neurological: Negative for headaches.    Vital Signs: BP 129/69 mmHg  Pulse 58  Temp(Src) 98.1 F (36.7 C) (Oral)  Resp 16  Ht 5\' 6"  (1.676 m)  Wt 140 lb (63.504 kg)  BMI 22.61 kg/m2  SpO2 99%  Physical Exam  Constitutional: She is oriented to person, place, and time. She appears well-developed and well-nourished.  Cardiovascular: Normal rate and regular rhythm.   Pulmonary/Chest: Effort normal and breath sounds normal.  Abdominal: Soft. Bowel sounds are normal. There is no tenderness.  Musculoskeletal:  Wound VAC present in right knee from recent total knee arthroplasty, 1+ edema right lower extremity, no edema left lower extremity  Neurological: She is alert and oriented to person, place, and time.    Mallampati Score:     Imaging: No results found.  Labs:  CBC:  Recent Labs  04/04/15 0440 04/05/15 0536 04/22/15 1143 06/10/15 0725  WBC 17.9* 13.8* 5.0 4.4  HGB 8.0* 8.8* 11.7* 12.5  HCT 23.1* 26.1* 35.2* 38.3  PLT 353 384 317 210    COAGS:  Recent Labs  03/19/15 1105  05/21/15 1138  05/28/15 0906 06/04/15 1113 06/10/15 0725  INR 2.37*  < > 2.2 1.7 2.8 1.04  APTT 35  --   --   --   --  26  < > = values in this interval not displayed.  BMP:  Recent Labs  03/26/15 0535 04/04/15 0440 04/05/15 0536 04/22/15 1143  NA 140 127* 133* 138  K 4.4 4.1 4.3 3.1*  CL 109 93* 101 104  CO2 27 25 25 22   GLUCOSE 116* 113* 101* 160*  BUN 12 31* 26* 15  CALCIUM 8.1* 8.2* 8.6* 9.3  CREATININE 0.56 1.45* 0.94 0.92  GFRNONAA >60 35* 59* >60  GFRAA >60 41* >60 >60    LIVER FUNCTION TESTS: No results for input(s): BILITOT, AST, ALT, ALKPHOS, PROT, ALBUMIN in the last 8760 hours.  TUMOR MARKERS: No results for input(s): AFPTM, CEA, CA199, CHROMGRNA in the last 8760 hours.  Assessment and Plan: Jamie Coffey is a 74 y.o. female with past medical history significant for PAF, factor V Leiden mutation, on Coumadin, prior DVT/PE, breast cancer 2010, prior nephrolithiasis, and right total knee arthroplasty in August of this year. She has had recent history of hematuria and subsequent CT abd/pelvis at [facility] which revealed right pelvic/rectus sheath fluid collections. She presents today for CT-guided aspiration of the above mentioned fluid collections.Risks and benefits discussed with the patient/family including, but not limited to bleeding, infection, damage to adjacent structures or low yield requiring additional tests.All of the patient's questions were answered, patient is agreeable to proceed.Consent signed and in chart.     Thank you for this interesting consult.  I greatly enjoyed meeting Jamie Coffey and look forward to participating in their care.  A copy of this report was sent to the requesting provider on this date.  Signed: D. Rowe Robert 06/10/2015, 8:32 AM   I spent a total of 15 minutes in face to face in clinical consultation, greater than 50% of which was counseling/coordinating care for CT guided aspiration of right pelvic/rectus sheath fluid  collections

## 2015-06-10 NOTE — Progress Notes (Signed)
Wound vac noted to rt knee area, pt states she has had for several weeks, post knee replacement, an area just won't heal states pt.

## 2015-06-10 NOTE — Discharge Instructions (Signed)
Moderate Conscious Sedation, Adult Sedation is the use of medicines to promote relaxation and relieve discomfort and anxiety. Moderate conscious sedation is a type of sedation. Under moderate conscious sedation you are less alert than normal but are still able to respond to instructions or stimulation. Moderate conscious sedation is used during short medical and dental procedures. It is milder than deep sedation or general anesthesia and allows you to return to your regular activities sooner. LET [facility] CARE PROVIDER KNOW ABOUT:   Any allergies you have.  All medicines you are taking, including vitamins, herbs, eye drops, creams, and over-the-counter medicines.  Use of steroids (by mouth or creams).  Previous problems you or members of your family have had with the use of anesthetics.  Any blood disorders you have.  Previous surgeries you have had.  Medical conditions you have.  Possibility of pregnancy, if this applies.  Use of cigarettes, alcohol, or illegal drugs. RISKS AND COMPLICATIONS Generally, this is a safe procedure. However, as with any procedure, problems can occur. Possible problems include:  Oversedation.  Trouble breathing on your own. You may need to have a breathing tube until you are awake and breathing on your own.  Allergic reaction to any of the medicines used for the procedure. BEFORE THE PROCEDURE  You may have blood tests done. These tests can help show how well your kidneys and liver are working. They can also show how well your blood clots.  A physical exam will be done.  Only take medicines as directed by your health care provider. You may need to stop taking medicines (such as blood thinners, aspirin, or nonsteroidal anti-inflammatory drugs) before the procedure.   Do not eat or drink at least 6 hours before the procedure or as directed by your health care provider.  Arrange for a responsible adult, family member, or friend to take you home  after the procedure. He or she should stay with you for at least 24 hours after the procedure, until the medicine has worn off. PROCEDURE   An intravenous (IV) catheter will be inserted into one of your veins. Medicine will be able to flow directly into your body through this catheter. You may be given medicine through this tube to help prevent pain and help you relax.  The medical or dental procedure will be done. AFTER THE PROCEDURE  You will stay in a recovery area until the medicine has worn off. Your blood pressure and pulse will be checked.   Depending on the procedure you had, you may be allowed to go home when you can tolerate liquids and your pain is under control.   This information is not intended to replace advice given to you by your health care provider. Make sure you discuss any questions you have with your health care provider.    Check your aspiration site every day for signs of infection. Watch for: Redness, swelling, or pain. Fluid, blood, or pus. GET HELP IF: You have a fever. You have redness, swelling, or pain at the biopsy site, and it lasts longer than a few days. You have fluid, blood, or pus coming from the biopsy site. You feel sick to your stomach (nauseous). You throw up (vomit). GET HELP RIGHT AWAY IF: You are short of breath. You have trouble breathing. Your chest hurts. You feel dizzy or you pass out (faint). You have bleeding that does not stop with pressure or a bandage. You cough up blood. Your belly (abdomen) hurts.   This  information is not intended to replace advice given to you by your health care provider. Make sure you discuss any questions you have with your health care provider.   Document Released: 07/09/2008 Document Revised: 12/11/2014 Document Reviewed: 07/23/2014 Elsevier Interactive Patient Education [employer].

## 2015-06-12 DIAGNOSIS — E0590 Thyrotoxicosis, unspecified without thyrotoxic crisis or storm: Secondary | ICD-10-CM | POA: Diagnosis not present

## 2015-06-12 DIAGNOSIS — Z96651 Presence of right artificial knee joint: Secondary | ICD-10-CM | POA: Diagnosis not present

## 2015-06-12 DIAGNOSIS — T814XXA Infection following a procedure, initial encounter: Secondary | ICD-10-CM | POA: Diagnosis not present

## 2015-06-12 DIAGNOSIS — Z87440 Personal history of urinary (tract) infections: Secondary | ICD-10-CM | POA: Diagnosis not present

## 2015-06-12 DIAGNOSIS — I10 Essential (primary) hypertension: Secondary | ICD-10-CM | POA: Diagnosis not present

## 2015-06-12 DIAGNOSIS — I4891 Unspecified atrial fibrillation: Secondary | ICD-10-CM | POA: Diagnosis not present

## 2015-06-13 LAB — CULTURE, ROUTINE-ABSCESS
Culture: NO GROWTH
SPECIAL REQUESTS: NORMAL

## 2015-06-14 DIAGNOSIS — Z87440 Personal history of urinary (tract) infections: Secondary | ICD-10-CM | POA: Diagnosis not present

## 2015-06-14 DIAGNOSIS — E0590 Thyrotoxicosis, unspecified without thyrotoxic crisis or storm: Secondary | ICD-10-CM | POA: Diagnosis not present

## 2015-06-14 DIAGNOSIS — Z96651 Presence of right artificial knee joint: Secondary | ICD-10-CM | POA: Diagnosis not present

## 2015-06-14 DIAGNOSIS — I4891 Unspecified atrial fibrillation: Secondary | ICD-10-CM | POA: Diagnosis not present

## 2015-06-14 DIAGNOSIS — I10 Essential (primary) hypertension: Secondary | ICD-10-CM | POA: Diagnosis not present

## 2015-06-14 DIAGNOSIS — T814XXA Infection following a procedure, initial encounter: Secondary | ICD-10-CM | POA: Diagnosis not present

## 2015-06-17 DIAGNOSIS — I10 Essential (primary) hypertension: Secondary | ICD-10-CM | POA: Diagnosis not present

## 2015-06-17 DIAGNOSIS — Z87440 Personal history of urinary (tract) infections: Secondary | ICD-10-CM | POA: Diagnosis not present

## 2015-06-17 DIAGNOSIS — E0590 Thyrotoxicosis, unspecified without thyrotoxic crisis or storm: Secondary | ICD-10-CM | POA: Diagnosis not present

## 2015-06-17 DIAGNOSIS — T814XXA Infection following a procedure, initial encounter: Secondary | ICD-10-CM | POA: Diagnosis not present

## 2015-06-17 DIAGNOSIS — Z96651 Presence of right artificial knee joint: Secondary | ICD-10-CM | POA: Diagnosis not present

## 2015-06-17 DIAGNOSIS — I4891 Unspecified atrial fibrillation: Secondary | ICD-10-CM | POA: Diagnosis not present

## 2015-06-18 ENCOUNTER — Ambulatory Visit (INDEPENDENT_AMBULATORY_CARE_PROVIDER_SITE_OTHER): Payer: Medicare Other | Admitting: *Deleted

## 2015-06-18 DIAGNOSIS — D6851 Activated protein C resistance: Secondary | ICD-10-CM

## 2015-06-18 DIAGNOSIS — Z5181 Encounter for therapeutic drug level monitoring: Secondary | ICD-10-CM | POA: Diagnosis not present

## 2015-06-18 DIAGNOSIS — Z86711 Personal history of pulmonary embolism: Secondary | ICD-10-CM

## 2015-06-18 DIAGNOSIS — I480 Paroxysmal atrial fibrillation: Secondary | ICD-10-CM

## 2015-06-18 LAB — POCT INR: INR: 1.6

## 2015-06-19 DIAGNOSIS — I10 Essential (primary) hypertension: Secondary | ICD-10-CM | POA: Diagnosis not present

## 2015-06-19 DIAGNOSIS — E0590 Thyrotoxicosis, unspecified without thyrotoxic crisis or storm: Secondary | ICD-10-CM | POA: Diagnosis not present

## 2015-06-19 DIAGNOSIS — T814XXA Infection following a procedure, initial encounter: Secondary | ICD-10-CM | POA: Diagnosis not present

## 2015-06-19 DIAGNOSIS — Z96651 Presence of right artificial knee joint: Secondary | ICD-10-CM | POA: Diagnosis not present

## 2015-06-19 DIAGNOSIS — Z87440 Personal history of urinary (tract) infections: Secondary | ICD-10-CM | POA: Diagnosis not present

## 2015-06-19 DIAGNOSIS — I4891 Unspecified atrial fibrillation: Secondary | ICD-10-CM | POA: Diagnosis not present

## 2015-06-21 DIAGNOSIS — E0590 Thyrotoxicosis, unspecified without thyrotoxic crisis or storm: Secondary | ICD-10-CM | POA: Diagnosis not present

## 2015-06-21 DIAGNOSIS — Z96651 Presence of right artificial knee joint: Secondary | ICD-10-CM | POA: Diagnosis not present

## 2015-06-21 DIAGNOSIS — I4891 Unspecified atrial fibrillation: Secondary | ICD-10-CM | POA: Diagnosis not present

## 2015-06-21 DIAGNOSIS — T814XXA Infection following a procedure, initial encounter: Secondary | ICD-10-CM | POA: Diagnosis not present

## 2015-06-21 DIAGNOSIS — Z87440 Personal history of urinary (tract) infections: Secondary | ICD-10-CM | POA: Diagnosis not present

## 2015-06-21 DIAGNOSIS — I10 Essential (primary) hypertension: Secondary | ICD-10-CM | POA: Diagnosis not present

## 2015-06-24 DIAGNOSIS — I10 Essential (primary) hypertension: Secondary | ICD-10-CM | POA: Diagnosis not present

## 2015-06-24 DIAGNOSIS — T814XXA Infection following a procedure, initial encounter: Secondary | ICD-10-CM | POA: Diagnosis not present

## 2015-06-24 DIAGNOSIS — I4891 Unspecified atrial fibrillation: Secondary | ICD-10-CM | POA: Diagnosis not present

## 2015-06-24 DIAGNOSIS — Z87440 Personal history of urinary (tract) infections: Secondary | ICD-10-CM | POA: Diagnosis not present

## 2015-06-24 DIAGNOSIS — E0590 Thyrotoxicosis, unspecified without thyrotoxic crisis or storm: Secondary | ICD-10-CM | POA: Diagnosis not present

## 2015-06-24 DIAGNOSIS — Z96651 Presence of right artificial knee joint: Secondary | ICD-10-CM | POA: Diagnosis not present

## 2015-06-25 DIAGNOSIS — Z471 Aftercare following joint replacement surgery: Secondary | ICD-10-CM | POA: Diagnosis not present

## 2015-06-25 DIAGNOSIS — Z96651 Presence of right artificial knee joint: Secondary | ICD-10-CM | POA: Diagnosis not present

## 2015-06-26 DIAGNOSIS — Z87440 Personal history of urinary (tract) infections: Secondary | ICD-10-CM | POA: Diagnosis not present

## 2015-06-26 DIAGNOSIS — I10 Essential (primary) hypertension: Secondary | ICD-10-CM | POA: Diagnosis not present

## 2015-06-26 DIAGNOSIS — E0590 Thyrotoxicosis, unspecified without thyrotoxic crisis or storm: Secondary | ICD-10-CM | POA: Diagnosis not present

## 2015-06-26 DIAGNOSIS — I4891 Unspecified atrial fibrillation: Secondary | ICD-10-CM | POA: Diagnosis not present

## 2015-06-26 DIAGNOSIS — Z96651 Presence of right artificial knee joint: Secondary | ICD-10-CM | POA: Diagnosis not present

## 2015-06-26 DIAGNOSIS — T814XXA Infection following a procedure, initial encounter: Secondary | ICD-10-CM | POA: Diagnosis not present

## 2015-06-28 DIAGNOSIS — T814XXA Infection following a procedure, initial encounter: Secondary | ICD-10-CM | POA: Diagnosis not present

## 2015-06-28 DIAGNOSIS — I4891 Unspecified atrial fibrillation: Secondary | ICD-10-CM | POA: Diagnosis not present

## 2015-06-28 DIAGNOSIS — E0590 Thyrotoxicosis, unspecified without thyrotoxic crisis or storm: Secondary | ICD-10-CM | POA: Diagnosis not present

## 2015-06-28 DIAGNOSIS — Z87440 Personal history of urinary (tract) infections: Secondary | ICD-10-CM | POA: Diagnosis not present

## 2015-06-28 DIAGNOSIS — I10 Essential (primary) hypertension: Secondary | ICD-10-CM | POA: Diagnosis not present

## 2015-06-28 DIAGNOSIS — Z96651 Presence of right artificial knee joint: Secondary | ICD-10-CM | POA: Diagnosis not present

## 2015-07-01 ENCOUNTER — Ambulatory Visit (INDEPENDENT_AMBULATORY_CARE_PROVIDER_SITE_OTHER): Payer: Medicare Other | Admitting: *Deleted

## 2015-07-01 DIAGNOSIS — I480 Paroxysmal atrial fibrillation: Secondary | ICD-10-CM

## 2015-07-01 DIAGNOSIS — Z5181 Encounter for therapeutic drug level monitoring: Secondary | ICD-10-CM | POA: Diagnosis not present

## 2015-07-01 DIAGNOSIS — Z96651 Presence of right artificial knee joint: Secondary | ICD-10-CM | POA: Diagnosis not present

## 2015-07-01 DIAGNOSIS — E0590 Thyrotoxicosis, unspecified without thyrotoxic crisis or storm: Secondary | ICD-10-CM | POA: Diagnosis not present

## 2015-07-01 DIAGNOSIS — D6851 Activated protein C resistance: Secondary | ICD-10-CM | POA: Diagnosis not present

## 2015-07-01 DIAGNOSIS — I4891 Unspecified atrial fibrillation: Secondary | ICD-10-CM | POA: Diagnosis not present

## 2015-07-01 DIAGNOSIS — T814XXA Infection following a procedure, initial encounter: Secondary | ICD-10-CM | POA: Diagnosis not present

## 2015-07-01 DIAGNOSIS — Z86711 Personal history of pulmonary embolism: Secondary | ICD-10-CM

## 2015-07-01 DIAGNOSIS — Z87440 Personal history of urinary (tract) infections: Secondary | ICD-10-CM | POA: Diagnosis not present

## 2015-07-01 DIAGNOSIS — I10 Essential (primary) hypertension: Secondary | ICD-10-CM | POA: Diagnosis not present

## 2015-07-01 LAB — POCT INR: INR: 2.1

## 2015-07-02 ENCOUNTER — Ambulatory Visit: Payer: Medicare Other | Admitting: Cardiology

## 2015-07-03 DIAGNOSIS — Z96651 Presence of right artificial knee joint: Secondary | ICD-10-CM | POA: Diagnosis not present

## 2015-07-03 DIAGNOSIS — Z471 Aftercare following joint replacement surgery: Secondary | ICD-10-CM | POA: Diagnosis not present

## 2015-07-03 DIAGNOSIS — M25561 Pain in right knee: Secondary | ICD-10-CM | POA: Diagnosis not present

## 2015-07-05 DIAGNOSIS — I4891 Unspecified atrial fibrillation: Secondary | ICD-10-CM | POA: Diagnosis not present

## 2015-07-05 DIAGNOSIS — T814XXA Infection following a procedure, initial encounter: Secondary | ICD-10-CM | POA: Diagnosis not present

## 2015-07-05 DIAGNOSIS — Z87440 Personal history of urinary (tract) infections: Secondary | ICD-10-CM | POA: Diagnosis not present

## 2015-07-05 DIAGNOSIS — Z96651 Presence of right artificial knee joint: Secondary | ICD-10-CM | POA: Diagnosis not present

## 2015-07-05 DIAGNOSIS — E0590 Thyrotoxicosis, unspecified without thyrotoxic crisis or storm: Secondary | ICD-10-CM | POA: Diagnosis not present

## 2015-07-05 DIAGNOSIS — I10 Essential (primary) hypertension: Secondary | ICD-10-CM | POA: Diagnosis not present

## 2015-07-08 DIAGNOSIS — Z23 Encounter for immunization: Secondary | ICD-10-CM | POA: Diagnosis not present

## 2015-07-08 DIAGNOSIS — N9489 Other specified conditions associated with female genital organs and menstrual cycle: Secondary | ICD-10-CM | POA: Diagnosis not present

## 2015-07-10 DIAGNOSIS — R310 Gross hematuria: Secondary | ICD-10-CM | POA: Diagnosis not present

## 2015-07-10 DIAGNOSIS — N3020 Other chronic cystitis without hematuria: Secondary | ICD-10-CM | POA: Diagnosis not present

## 2015-07-13 ENCOUNTER — Other Ambulatory Visit: Payer: Self-pay | Admitting: Cardiology

## 2015-07-15 ENCOUNTER — Other Ambulatory Visit: Payer: Self-pay | Admitting: Urology

## 2015-07-15 ENCOUNTER — Ambulatory Visit (INDEPENDENT_AMBULATORY_CARE_PROVIDER_SITE_OTHER): Payer: Medicare Other | Admitting: *Deleted

## 2015-07-15 DIAGNOSIS — Z86711 Personal history of pulmonary embolism: Secondary | ICD-10-CM

## 2015-07-15 DIAGNOSIS — Z5181 Encounter for therapeutic drug level monitoring: Secondary | ICD-10-CM | POA: Diagnosis not present

## 2015-07-15 DIAGNOSIS — D6851 Activated protein C resistance: Secondary | ICD-10-CM | POA: Diagnosis not present

## 2015-07-15 DIAGNOSIS — I480 Paroxysmal atrial fibrillation: Secondary | ICD-10-CM

## 2015-07-15 LAB — POCT INR: INR: 2.1

## 2015-07-25 ENCOUNTER — Telehealth: Payer: Self-pay | Admitting: Cardiology

## 2015-07-25 NOTE — Telephone Encounter (Signed)
Patient needs to have a cystoscopy and bladder bx. She has PAF and has been in NSR.   She also has Factor 5 Leiden def with history of DVT and PE with prior knee arthoscopy.  She is on chronic anticoagulation.  She is ok from cardiac standpoint to be off coumadin for above procedure but will need to get clearance from PCP regarding her Factor 5 Leiden def with history of PE and DVT as to whether she needs to be bridged with Lovenox.  She recently had an orthopedic procedure done and was bridged with Lovenox with severe bleeding complications requiring blood transfusions after knee surgery but this was in the setting of very poor PO intake and INR that was very high.  PCP needs to make the final decision on whether she needs to be bridged with Lovenox.  SHe does not need bridging from a PAF standpoint.  If PCP wants her bridged we will give patient instructions on Lovenox dosing.

## 2015-07-26 NOTE — Telephone Encounter (Signed)
Printed and placed in MR "to be faxed" bin. 

## 2015-07-29 ENCOUNTER — Telehealth: Payer: Self-pay | Admitting: Cardiology

## 2015-07-29 NOTE — Telephone Encounter (Signed)
Left message for Jamie Coffey that clearance was sent last week but will send again.  Clearance printed and faxed to [facility].

## 2015-07-29 NOTE — Telephone Encounter (Signed)
New Message    Chasity from [facility] is calling for surgical clearance that they sent over last week

## 2015-07-30 ENCOUNTER — Other Ambulatory Visit: Payer: Self-pay | Admitting: Cardiology

## 2015-08-07 ENCOUNTER — Ambulatory Visit (INDEPENDENT_AMBULATORY_CARE_PROVIDER_SITE_OTHER): Payer: Medicare Other | Admitting: Pharmacist

## 2015-08-07 DIAGNOSIS — Z5181 Encounter for therapeutic drug level monitoring: Secondary | ICD-10-CM

## 2015-08-07 DIAGNOSIS — Z86711 Personal history of pulmonary embolism: Secondary | ICD-10-CM | POA: Diagnosis not present

## 2015-08-07 DIAGNOSIS — D6851 Activated protein C resistance: Secondary | ICD-10-CM | POA: Diagnosis not present

## 2015-08-07 DIAGNOSIS — I480 Paroxysmal atrial fibrillation: Secondary | ICD-10-CM | POA: Diagnosis not present

## 2015-08-07 LAB — POCT INR: INR: 2.1

## 2015-08-08 ENCOUNTER — Telehealth: Payer: Self-pay | Admitting: Pharmacist

## 2015-08-08 NOTE — Telephone Encounter (Signed)
Received fax back from PCP (faxed to urology and filed on 08/08/15) - patient does not need Lovenox bridge for procedure on 1/11. Ok to hold Coumadin for 4 days prior to procedure. Patient was called and verbalized understanding. Will f/u in Coumadin clinic in 1 month as scheduled.

## 2015-08-14 DIAGNOSIS — Z96651 Presence of right artificial knee joint: Secondary | ICD-10-CM | POA: Diagnosis not present

## 2015-08-14 DIAGNOSIS — Z471 Aftercare following joint replacement surgery: Secondary | ICD-10-CM | POA: Diagnosis not present

## 2015-08-16 ENCOUNTER — Encounter (HOSPITAL_BASED_OUTPATIENT_CLINIC_OR_DEPARTMENT_OTHER): Payer: Self-pay | Admitting: *Deleted

## 2015-08-16 NOTE — Progress Notes (Addendum)
NPO AFTER MN.  ARRIVE AT 0700.  GETTING LAB WORK DONE TUES. 08-19-2014. (CBC, BMET, PT/INR) CURRENT EKG IN CHART AND EPIC.  WILL TAKE AM MEDS DOS W/ SIPS OF WATER.

## 2015-08-20 DIAGNOSIS — Z86711 Personal history of pulmonary embolism: Secondary | ICD-10-CM | POA: Diagnosis not present

## 2015-08-20 DIAGNOSIS — F329 Major depressive disorder, single episode, unspecified: Secondary | ICD-10-CM | POA: Diagnosis not present

## 2015-08-20 DIAGNOSIS — Z86718 Personal history of other venous thrombosis and embolism: Secondary | ICD-10-CM | POA: Diagnosis not present

## 2015-08-20 DIAGNOSIS — D6851 Activated protein C resistance: Secondary | ICD-10-CM | POA: Diagnosis not present

## 2015-08-20 DIAGNOSIS — R310 Gross hematuria: Secondary | ICD-10-CM | POA: Diagnosis present

## 2015-08-20 DIAGNOSIS — E039 Hypothyroidism, unspecified: Secondary | ICD-10-CM | POA: Diagnosis not present

## 2015-08-20 DIAGNOSIS — Z96651 Presence of right artificial knee joint: Secondary | ICD-10-CM | POA: Diagnosis not present

## 2015-08-20 DIAGNOSIS — K219 Gastro-esophageal reflux disease without esophagitis: Secondary | ICD-10-CM | POA: Diagnosis not present

## 2015-08-20 DIAGNOSIS — Z79899 Other long term (current) drug therapy: Secondary | ICD-10-CM | POA: Diagnosis not present

## 2015-08-20 DIAGNOSIS — I4891 Unspecified atrial fibrillation: Secondary | ICD-10-CM | POA: Diagnosis not present

## 2015-08-20 DIAGNOSIS — Z7901 Long term (current) use of anticoagulants: Secondary | ICD-10-CM | POA: Diagnosis not present

## 2015-08-20 DIAGNOSIS — I10 Essential (primary) hypertension: Secondary | ICD-10-CM | POA: Diagnosis not present

## 2015-08-20 DIAGNOSIS — N3021 Other chronic cystitis with hematuria: Secondary | ICD-10-CM | POA: Diagnosis not present

## 2015-08-20 DIAGNOSIS — M1990 Unspecified osteoarthritis, unspecified site: Secondary | ICD-10-CM | POA: Diagnosis not present

## 2015-08-20 DIAGNOSIS — Z87442 Personal history of urinary calculi: Secondary | ICD-10-CM | POA: Diagnosis not present

## 2015-08-20 DIAGNOSIS — Z792 Long term (current) use of antibiotics: Secondary | ICD-10-CM | POA: Diagnosis not present

## 2015-08-20 DIAGNOSIS — Z853 Personal history of malignant neoplasm of breast: Secondary | ICD-10-CM | POA: Diagnosis not present

## 2015-08-20 DIAGNOSIS — N3289 Other specified disorders of bladder: Secondary | ICD-10-CM | POA: Diagnosis not present

## 2015-08-20 LAB — CBC
HEMATOCRIT: 40.4 % (ref 36.0–46.0)
HEMOGLOBIN: 13.1 g/dL (ref 12.0–15.0)
MCH: 29.9 pg (ref 26.0–34.0)
MCHC: 32.4 g/dL (ref 30.0–36.0)
MCV: 92.2 fL (ref 78.0–100.0)
Platelets: 263 10*3/uL (ref 150–400)
RBC: 4.38 MIL/uL (ref 3.87–5.11)
RDW: 13.8 % (ref 11.5–15.5)
WBC: 5 10*3/uL (ref 4.0–10.5)

## 2015-08-20 LAB — BASIC METABOLIC PANEL
ANION GAP: 8 (ref 5–15)
BUN: 17 mg/dL (ref 6–20)
CHLORIDE: 102 mmol/L (ref 101–111)
CO2: 30 mmol/L (ref 22–32)
Calcium: 9.1 mg/dL (ref 8.9–10.3)
Creatinine, Ser: 0.82 mg/dL (ref 0.44–1.00)
GFR calc Af Amer: >60 mL/min (ref >60)
GFR calc non Af Amer: >60 mL/min (ref >60)
GLUCOSE: 101 mg/dL — AB (ref 65–99)
POTASSIUM: 3.6 mmol/L (ref 3.5–5.1)
Sodium: 140 mmol/L (ref 135–145)

## 2015-08-20 LAB — PROTIME-INR
INR: 1.05 (ref 0.00–1.49)
Prothrombin Time: 13.9 seconds (ref 11.6–15.2)

## 2015-08-21 ENCOUNTER — Ambulatory Visit (HOSPITAL_BASED_OUTPATIENT_CLINIC_OR_DEPARTMENT_OTHER): Payer: Medicare Other | Admitting: Anesthesiology

## 2015-08-21 ENCOUNTER — Encounter (HOSPITAL_BASED_OUTPATIENT_CLINIC_OR_DEPARTMENT_OTHER)
Admission: RE | Disposition: A | Discharge status: Home / Self Care | Payer: Self-pay | Source: Ambulatory Visit | Attending: Urology

## 2015-08-21 ENCOUNTER — Encounter (HOSPITAL_BASED_OUTPATIENT_CLINIC_OR_DEPARTMENT_OTHER): Payer: Self-pay | Admitting: *Deleted

## 2015-08-21 ENCOUNTER — Ambulatory Visit (HOSPITAL_BASED_OUTPATIENT_CLINIC_OR_DEPARTMENT_OTHER)
Admission: RE | Admit: 2015-08-21 | Discharge: 2015-08-21 | Disposition: A | Discharge status: Home / Self Care | Payer: Medicare Other | Source: Ambulatory Visit | Attending: Urology | Admitting: Urology

## 2015-08-21 DIAGNOSIS — R310 Gross hematuria: Secondary | ICD-10-CM | POA: Diagnosis not present

## 2015-08-21 DIAGNOSIS — D6851 Activated protein C resistance: Secondary | ICD-10-CM | POA: Insufficient documentation

## 2015-08-21 DIAGNOSIS — E039 Hypothyroidism, unspecified: Secondary | ICD-10-CM | POA: Insufficient documentation

## 2015-08-21 DIAGNOSIS — R1909 Other intra-abdominal and pelvic swelling, mass and lump: Secondary | ICD-10-CM | POA: Diagnosis not present

## 2015-08-21 DIAGNOSIS — Z853 Personal history of malignant neoplasm of breast: Secondary | ICD-10-CM | POA: Insufficient documentation

## 2015-08-21 DIAGNOSIS — Z86711 Personal history of pulmonary embolism: Secondary | ICD-10-CM | POA: Insufficient documentation

## 2015-08-21 DIAGNOSIS — N3289 Other specified disorders of bladder: Secondary | ICD-10-CM | POA: Insufficient documentation

## 2015-08-21 DIAGNOSIS — M1990 Unspecified osteoarthritis, unspecified site: Secondary | ICD-10-CM | POA: Insufficient documentation

## 2015-08-21 DIAGNOSIS — I10 Essential (primary) hypertension: Secondary | ICD-10-CM | POA: Diagnosis not present

## 2015-08-21 DIAGNOSIS — K219 Gastro-esophageal reflux disease without esophagitis: Secondary | ICD-10-CM | POA: Insufficient documentation

## 2015-08-21 DIAGNOSIS — Z86718 Personal history of other venous thrombosis and embolism: Secondary | ICD-10-CM | POA: Insufficient documentation

## 2015-08-21 DIAGNOSIS — F329 Major depressive disorder, single episode, unspecified: Secondary | ICD-10-CM | POA: Insufficient documentation

## 2015-08-21 DIAGNOSIS — Z79899 Other long term (current) drug therapy: Secondary | ICD-10-CM | POA: Insufficient documentation

## 2015-08-21 DIAGNOSIS — N210 Calculus in bladder: Secondary | ICD-10-CM | POA: Diagnosis not present

## 2015-08-21 DIAGNOSIS — Z87442 Personal history of urinary calculi: Secondary | ICD-10-CM | POA: Insufficient documentation

## 2015-08-21 DIAGNOSIS — I4891 Unspecified atrial fibrillation: Secondary | ICD-10-CM | POA: Insufficient documentation

## 2015-08-21 DIAGNOSIS — Z96651 Presence of right artificial knee joint: Secondary | ICD-10-CM | POA: Insufficient documentation

## 2015-08-21 DIAGNOSIS — N3080 Other cystitis without hematuria: Secondary | ICD-10-CM | POA: Diagnosis not present

## 2015-08-21 DIAGNOSIS — N3021 Other chronic cystitis with hematuria: Secondary | ICD-10-CM | POA: Insufficient documentation

## 2015-08-21 DIAGNOSIS — Z792 Long term (current) use of antibiotics: Secondary | ICD-10-CM | POA: Insufficient documentation

## 2015-08-21 DIAGNOSIS — Z7901 Long term (current) use of anticoagulants: Secondary | ICD-10-CM | POA: Insufficient documentation

## 2015-08-21 HISTORY — DX: Intra-abdominal and pelvic swelling, mass and lump, unspecified site: R19.00

## 2015-08-21 HISTORY — PX: HOLMIUM LASER APPLICATION: SHX5852

## 2015-08-21 HISTORY — DX: Other bursitis of hip, unspecified hip: M70.70

## 2015-08-21 HISTORY — DX: Personal history of other benign neoplasm: Z86.018

## 2015-08-21 HISTORY — PX: CYSTOGRAM: SHX6285

## 2015-08-21 HISTORY — DX: Frequency of micturition: R35.0

## 2015-08-21 HISTORY — DX: Other complications of procedures, not elsewhere classified, initial encounter: T81.89XA

## 2015-08-21 HISTORY — PX: CYSTOSCOPY WITH BIOPSY: SHX5122

## 2015-08-21 HISTORY — DX: Calculus of kidney: N20.0

## 2015-08-21 HISTORY — DX: Long term (current) use of anticoagulants: Z79.01

## 2015-08-21 HISTORY — DX: Personal history of malignant neoplasm of breast: Z85.3

## 2015-08-21 HISTORY — DX: Urgency of urination: R39.15

## 2015-08-21 HISTORY — DX: Gross hematuria: R31.0

## 2015-08-21 SURGERY — CYSTOSCOPY, WITH BIOPSY
Anesthesia: General | Site: Bladder

## 2015-08-21 MED ORDER — PROPOFOL 10 MG/ML IV BOLUS
INTRAVENOUS | Status: AC
  Filled 2015-08-21: qty 20

## 2015-08-21 MED ORDER — FENTANYL CITRATE (PF) 100 MCG/2ML IJ SOLN
25.0000 ug | INTRAMUSCULAR | Status: DC | PRN
  Filled 2015-08-21: qty 1

## 2015-08-21 MED ORDER — FENTANYL CITRATE (PF) 100 MCG/2ML IJ SOLN
INTRAMUSCULAR | Status: AC
  Filled 2015-08-21: qty 2

## 2015-08-21 MED ORDER — LIDOCAINE HCL (CARDIAC) 20 MG/ML IV SOLN
INTRAVENOUS | Status: DC | PRN
  Administered 2015-08-21: 60 mg via INTRAVENOUS
  Administered 2015-08-21: 40 mg via INTRAVENOUS

## 2015-08-21 MED ORDER — LABETALOL HCL 5 MG/ML IV SOLN
INTRAVENOUS | Status: AC
  Filled 2015-08-21: qty 4

## 2015-08-21 MED ORDER — STERILE WATER FOR IRRIGATION IR SOLN
Status: DC | PRN
  Administered 2015-08-21: 3000 mL

## 2015-08-21 MED ORDER — DIATRIZOATE MEGLUMINE 30 % UR SOLN
URETHRAL | Status: DC | PRN
  Administered 2015-08-21: 300 mL via URETHRAL

## 2015-08-21 MED ORDER — LIDOCAINE HCL 2 % EX GEL
CUTANEOUS | Status: DC | PRN
  Administered 2015-08-21: 1

## 2015-08-21 MED ORDER — ONDANSETRON HCL 4 MG/2ML IJ SOLN
4.0000 mg | Freq: Once | INTRAMUSCULAR | Status: DC | PRN
  Filled 2015-08-21: qty 2

## 2015-08-21 MED ORDER — ARTIFICIAL TEARS OP OINT
TOPICAL_OINTMENT | OPHTHALMIC | Status: AC
  Filled 2015-08-21: qty 3.5

## 2015-08-21 MED ORDER — CEFAZOLIN SODIUM-DEXTROSE 2-3 GM-% IV SOLR
2.0000 g | INTRAVENOUS | Status: AC
  Administered 2015-08-21: 2 g via INTRAVENOUS
  Filled 2015-08-21: qty 50

## 2015-08-21 MED ORDER — DEXAMETHASONE SODIUM PHOSPHATE 10 MG/ML IJ SOLN
INTRAMUSCULAR | Status: AC
  Filled 2015-08-21: qty 1

## 2015-08-21 MED ORDER — ONDANSETRON HCL 4 MG/2ML IJ SOLN
INTRAMUSCULAR | Status: DC | PRN
  Administered 2015-08-21: 4 mg via INTRAVENOUS

## 2015-08-21 MED ORDER — LIDOCAINE HCL (CARDIAC) 20 MG/ML IV SOLN
INTRAVENOUS | Status: AC
  Filled 2015-08-21: qty 5

## 2015-08-21 MED ORDER — ONDANSETRON HCL 4 MG/2ML IJ SOLN
INTRAMUSCULAR | Status: AC
  Filled 2015-08-21: qty 2

## 2015-08-21 MED ORDER — DEXAMETHASONE SODIUM PHOSPHATE 4 MG/ML IJ SOLN
INTRAMUSCULAR | Status: DC | PRN
  Administered 2015-08-21: 10 mg via INTRAVENOUS

## 2015-08-21 MED ORDER — LACTATED RINGERS IV SOLN
INTRAVENOUS | Status: DC
  Administered 2015-08-21: 08:00:00 via INTRAVENOUS
  Filled 2015-08-21: qty 1000

## 2015-08-21 MED ORDER — CEFAZOLIN SODIUM-DEXTROSE 2-3 GM-% IV SOLR
INTRAVENOUS | Status: AC
  Filled 2015-08-21: qty 50

## 2015-08-21 MED ORDER — FENTANYL CITRATE (PF) 100 MCG/2ML IJ SOLN
INTRAMUSCULAR | Status: DC | PRN
  Administered 2015-08-21 (×2): 50 ug via INTRAVENOUS

## 2015-08-21 MED ORDER — PROPOFOL 10 MG/ML IV BOLUS
INTRAVENOUS | Status: DC | PRN
  Administered 2015-08-21: 150 mg via INTRAVENOUS
  Administered 2015-08-21: 50 mg via INTRAVENOUS

## 2015-08-21 MED ORDER — ESMOLOL HCL 100 MG/10ML IV SOLN
INTRAVENOUS | Status: AC
  Filled 2015-08-21: qty 10

## 2015-08-21 MED ORDER — ESMOLOL HCL 100 MG/10ML IV SOLN
INTRAVENOUS | Status: DC | PRN
  Administered 2015-08-21 (×2): 10 mg via INTRAVENOUS

## 2015-08-21 MED ORDER — LABETALOL HCL 5 MG/ML IV SOLN
5.0000 mg | INTRAVENOUS | Status: DC | PRN
  Administered 2015-08-21: 5 mg via INTRAVENOUS
  Filled 2015-08-21: qty 4

## 2015-08-21 SURGICAL SUPPLY — 31 items
BAG DRAIN URO-CYSTO SKYTR STRL (DRAIN) ×4 IMPLANT
BAG DRN UROCATH (DRAIN) ×2
CANISTER SUCT LVC 12 LTR MEDI- (MISCELLANEOUS) IMPLANT
CATH ROBINSON RED A/P 14FR (CATHETERS) IMPLANT
CATH ROBINSON RED A/P 16FR (CATHETERS) ×2 IMPLANT
CLOTH BEACON ORANGE TIMEOUT ST (SAFETY) ×4 IMPLANT
ELECT REM PT RETURN 9FT ADLT (ELECTROSURGICAL) ×4
ELECTRODE REM PT RTRN 9FT ADLT (ELECTROSURGICAL) ×2 IMPLANT
FIBER LASER FLEXIVA 550 (UROLOGICAL SUPPLIES) ×2 IMPLANT
GLOVE BIO SURGEON STRL SZ 6.5 (GLOVE) ×1 IMPLANT
GLOVE BIO SURGEON STRL SZ7.5 (GLOVE) ×4 IMPLANT
GLOVE BIO SURGEONS STRL SZ 6.5 (GLOVE) ×1
GLOVE BIOGEL PI IND STRL 6.5 (GLOVE) IMPLANT
GLOVE BIOGEL PI INDICATOR 6.5 (GLOVE) ×4
GOWN STRL REUS W/ TWL XL LVL3 (GOWN DISPOSABLE) ×2 IMPLANT
GOWN STRL REUS W/TWL LRG LVL3 (GOWN DISPOSABLE) ×2 IMPLANT
GOWN STRL REUS W/TWL XL LVL3 (GOWN DISPOSABLE) ×4
KIT ROOM TURNOVER WOR (KITS) ×4 IMPLANT
MANIFOLD NEPTUNE II (INSTRUMENTS) ×2 IMPLANT
NDL SAFETY ECLIPSE 18X1.5 (NEEDLE) IMPLANT
NEEDLE HYPO 18GX1.5 SHARP (NEEDLE)
NEEDLE HYPO 22GX1.5 SAFETY (NEEDLE) IMPLANT
NS IRRIG 500ML POUR BTL (IV SOLUTION) IMPLANT
PACK CYSTO (CUSTOM PROCEDURE TRAY) ×4 IMPLANT
SYR 20CC LL (SYRINGE) IMPLANT
SYR BULB IRRIGATION 50ML (SYRINGE) IMPLANT
SYRINGE IRR TOOMEY STRL 70CC (SYRINGE) ×2 IMPLANT
TUBE CONNECTING 12'X1/4 (SUCTIONS)
TUBE CONNECTING 12X1/4 (SUCTIONS) IMPLANT
WATER STERILE IRR 3000ML UROMA (IV SOLUTION) ×4 IMPLANT
WATER STERILE IRR 500ML POUR (IV SOLUTION) ×2 IMPLANT

## 2015-08-21 NOTE — H&P (Signed)
Reason For Visit  [name] presents today to undergo cystoscopy with possible biopsy and cystogram.  Her fairly complicated recent history is summarized in the section below:   [name] presents today to further discuss her situation over the last 4 to 6 weeks. In addition, we are planning on a flexible cystoscopy. She was sent to me with gross hematuria thought to be secondary to anticoagulation along with some ongoing cystitis. She also had a history of nephrolithiasis and there were several potential reasons for her hematuria. At one point, her INR got very elevated. Her history from my initial evaluation is summarized in the section below. Her urine culture in mid October 2016 showed insignificant growth. Because of ongoing hematuria, we felt it appropriate to evaluate her further. Her hemoglobin was okay at 13.1. On a hematuria protocol CT, she did have a small nonobstructing stone in the lower pole of the right kidney. We did not feel that really represented enough pathology to have significant gross hematuria. Urologically things otherwise looked okay with the exception that the bladder was displaced substantially by a large cystic mass in the right pelvis. The radiologist felt this was separate from the right ovary and was not suspected to be an ovarian cystic neoplasm. They felt the most likely diagnosis was a subacute hematoma and had questioned whether the patient had been anticoagulated. There was nothing clinically to suggest abscess.   We subsequently arranged for [name] to undergo Interventional Radiology aspiration of this fluid collection. I personally spoke with the Interventional radiologist who did not see anything at the time of aspiration or on repeat imaging. It raised concern for anything other than what appeared to be a hematoma. There was also a rectus hematoma noted as well. He was unable to aspirate a large amount of fluid from this pelvic fluid collection. A total of 50 mL  of dark, nonfoul smelling fluid consistent with hematoma without infection was aspirated. Cytology was performed. Since that time, she has had resolution of her gross hematuria and is doing okay clinically. Because she had told me she was experiencing some ongoing hematuria, and the exact etiology for that has never been completely established, we recommend a flexible cystoscopy and she presents for that today. Her urinalysis today does show some ongoing microhematuria but no evidence of obvious pyuria or ongoing cystitis.      History of Present Illness   Recent Jamie Coffey Hx:  [name] presented in October as a referral from Jamie Coffey office for further assessment of some ongoing gross hematuria as well as recurrent cystitis with a prior history of nephrolithiasis. [name] is currently 75 years young. In the past she was seen and evaluated by Jamie Coffey in our office in 2009. At that time she had a nonobstructing renal stone that had increased in size from 6 to 11 mm. Because of concerns over metabolically active stone disease, she underwent stent placement followed by an ESWL which was apparently successful. Stone analysis was calcium oxalate. She has had no other clinical stone events.     Her medical history recently has been fairly complicated. She went in for knee replacement surgery and developed some superficial wound issues. She currently has a wound vac. She also is on anticoagulation for atrial fibrillation as well as due to DVT and history of pulmonary embolus. Her INR became markedly elevated and she did require a blood transfusion in the hospital. Over the past couple of months she has intermittently had  some painless gross hematuria. Urine has been bright red at times and other times tea colored. She reports having some hematuria on pretty much a daily basis although it does come and go. She tells me that right before her knee replacement surgery she was diagnosed with an  asymptomatic bacteruria and did get treated with ciprofloxacin. She apparently was also treated in the hospital for a UTI and had at least some degree of bladder pressure and lower abdominal symptoms at that time. She is currently without any dysuria or abdominal discomfort. She has had no flank pain. Within the last couple of weeks she has had a hemoglobin level which was okay at 12.1. Creatinine level checked last month was 0.8. She does remain on Coumadin at this time. INR 10 days ago apparently was within normal limits and she is due to have that checked again in the very near future.     Past Medical History Problems  1. History of Atrial fibrillation (I48.91) 2. History of arthritis (Z87.39) 3. History of breast cancer (Z85.3) 4. History of cardiac arrhythmia (Z86.79) 5. History of cardiac murmur (Z86.79) 6. History of depression (Z86.59) 7. History of esophageal reflux (Z87.19) 8. History of hypertension (Z86.79) 9. History of hypothyroidism (Z86.39) 10. History of Pulmonary Embolism 11. History of Venous Thrombosis  Surgical History Problems  1. History of Cystoscopy With Insertion Of Ureteral Stent Right 2. History of Hysterectomy 3. History of Knee Replacement 4. History of Knee Surgery 5. History of Left Breast Lumpectomy 6. History of Negative Pressure Wound Therapy 7. History of Renal Lithotripsy 8. History of Tonsillectomy  Current Meds 1. AmLODIPine Besylate 2.5 MG Oral Tablet;  Therapy: (Recorded:17Oct2016) to Recorded 2. Doxycycline Hyclate 100 MG Oral Tablet; TAKE 1 TABLET TWICE DAILY;  Therapy: (Recorded:17Oct2016) to Recorded 3. Synthroid 75 MCG Oral Tablet;  Therapy: [id number] to Recorded 4. Venlafaxine HCl ER 75 MG Oral Capsule Extended Release 24 Hour;  Therapy: (Recorded:17Oct2016) to Recorded 5. Warfarin Sodium 5 MG Oral Tablet;  Therapy: [id number] to Recorded  Allergies Medication  1. No Known Drug Allergies  Family History Problems  1. Family  history of Diabetes Mellitus 2. Family history of Family Health Status Number Of Children   1 son and 1 daughter 3. Family history of hypertension (Z82.49) : Mother, Father  Social History Problems  1. Alcohol Use (History)   1-2 a week 2. Caffeine Use   2-3 3. Family history of Death In The Family Mother   62 4. Father deceased   dec age 14yo 5. Marital History - Currently Married 6. Never a smoker 7. Number of children   1 son 36yo, 1 dtr 64yo 8. 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 Denied: Tobacco Use  Vitals   Height: 5 ft 6 in Weight: 140 lb  BMI Calculated: 22.6 BSA Calculated: 1.72 Blood Pressure: 110 / 73 Temperature: 97.9 F Heart Rate: 62  Physical Exam Constitutional: Well nourished and well developed . No acute distress.  ENT:. The ears and nose are normal in appearance.  Neck: The appearance of the neck is normal and no neck mass is present.  Pulmonary: No respiratory distress and normal respiratory rhythm and effort.  Cardiovascular: Heart rate and rhythm are normal . No peripheral edema.  Abdomen: The abdomen is soft and nontender. No masses are palpated. No CVA tenderness. No hernias are palpable. No hepatosplenomegaly noted.  Skin: Normal skin turgor, no visible rash and no visible skin lesions.  Neuro/Psych:. Mood and affect are appropriate.  Assessment Assessed  1. Gross hematuria (R31.0) 2. Chronic cystitis (N30.20)  Plan Chronic cystitis  1. Cysto; Status:Hold For - Appointment,Date of Service; Requested for:30Nov2016;  2. Follow-up Schedule Surgery Office  Follow-up  Status: Complete  Done: [id number] Health Maintenance  3. UA With REFLEX; [Do Not Release]; Status:Complete;   Done: [id number] 08:13AM  Discussion/Summary   Clinically [name] is doing better. My original plan for her was to continue to observe her and repeat some CT imaging in 3 to 4 months to make sure that this presumed hematoma was resolving in size. On today's  cystoscopy, however, she clearly has some abnormality on the right lateral wall of the bladder adjacent to where the cystic mass is. There is nothing that appears to be consistent with a transitional cell carcinoma but more of an inflammatory picture. There is a central area where there appears to be some fibrinoid necrotic material coming out of an opening. This area is surrounded by some erythematous and edematous mucosa consistent with an inflammatory picture. Whether this is simply some inflammatory changes due to this cystic mass or something else going on is not clear. This is what we would typically see if somebody were to have a diverticulitis or chronic diverticular inflammation adherent to, for example, the dome of the bladder. This is clearly not bowel and appears to be related to this cystic mass within her pelvis. I recommend at this point repeat cystoscopy under anesthesia with a cystogram to be sure there is no communication. I would like to remove some of this fibrinoid material to get a better look at the base of this abnormality and take some surrounding biopsies. She is on anticoagulation and that will need to either be held or bridged, depending on what is most appropriate, and we will need to discuss this with her other physicians as well. We will plan an outpatient procedure probably sometime after the first of the year at her request.

## 2015-08-21 NOTE — Anesthesia Postprocedure Evaluation (Signed)
Anesthesia Post Note  Patient: MATALIE STEFANICH  Procedure(s) Performed: Procedure(s) (LRB): CYSTOSCOPY WITH BLADDER BIOPSY (N/A) CYSTOGRAM (N/A) HOLMIUM LASER APPLICATION  Patient location during evaluation: PACU Anesthesia Type: General Level of consciousness: awake and alert Pain management: pain level controlled Vital Signs Assessment: post-procedure vital signs reviewed and stable Respiratory status: spontaneous breathing, nonlabored ventilation, respiratory function stable and patient connected to nasal cannula oxygen Cardiovascular status: blood pressure returned to baseline and stable Postop Assessment: no signs of nausea or vomiting Anesthetic complications: no    Last Vitals:  Filed Vitals:   08/21/15 0945 08/21/15 1000  BP: 175/67 176/58  Pulse: 62 59  Temp:    Resp: 17 16    Last Pain: There were no vitals filed for this visit.               Catalina Gravel

## 2015-08-21 NOTE — Transfer of Care (Signed)
Last Vitals:  Filed Vitals:   08/21/15 0714  BP: 131/69  Pulse: 67  Temp: 37 C  Resp: 16    Immediate Anesthesia Transfer of Care Note  Patient: Jamie Coffey  Procedure(s) Performed: Procedure(s) (LRB): CYSTOSCOPY WITH BLADDER BIOPSY (N/A) CYSTOGRAM (N/A) HOLMIUM LASER APPLICATION  Patient Location: PACU  Anesthesia Type: General  Level of Consciousness: awake, alert  and oriented  Airway & Oxygen Therapy: Patient Spontanous Breathing and Patient connected to face mask oxygen  Post-op Assessment: Report given to PACU RN and Post -op Vital signs reviewed and stable  Post vital signs: Reviewed and stable  Complications: No apparent anesthesia complications

## 2015-08-21 NOTE — Op Note (Signed)
Preoperative diagnosis: Pelvic hematoma, gross hematuria, questionable bladder mass Postoperative diagnosis: Same, bladder calcification  Procedure: Cystoscopy, cystogram, holmium laser lithotripsy of bladder calcification, bladder biopsy with fulguration   Surgeon: Bernestine Amass M.D.  Anesthesia: Gen.  Indications: Jamie Coffey presented several months ago for further evaluation of some questionable recurrent cystitis with gross hematuria. She had been on anticoagulation and her INR had gotten very elevated. A hematuria protocol CT scan showed a small nonobstructing stone in the lower pole right kidney but a very large cystic mass in the right hemipelvis. This appeared to be separate from the ovary and did not show any signs suggestive of cystic neoplasm. Radiographically this was most consistent with a subacute hematoma. This hematoma was subsequently aspirated. Approximately 50 mL of dark fluid consistent with hematoma was obtained. Cytology was negative.  On subsequent cystoscopy however she clearly had evidence of an inflammatory process on the right lateral wall of her bladder adjacent to where the cystic mass was present. In addition there appeared to be some fibrillar no a necrotic material coming out of a area where there was maximum inflammation. Whether this was a result of the aspiration or related to inflammatory changes within the cyst were unclear to me. She now presents for further assessment of this.     Technique and findings: Patient was brought the operating room where she had successful induction of general anesthesia. She received perioperative Ancef. Appropriate surgical timeout was performed. Cystoscopy revealed unremarkable urethra. The right lateral wall the bladder showed a 4 x 4 centimeter area of mucosal edema and urothelium. Within the central portion of this was a large calcified mass. This was consistent with some fibrillar void old clot that now had become heavily  calcified. Holmium laser lithotripter was used at 8 Hz and 0.8 J to disconnect this from the wall the bladder and then to break this up into manageable pieces which were then irrigated from the bladder.   Cystogram was performed utilizing fluoroscopy and a 16 French catheter. The bladder showed some irregular contouring on the right lateral side but no evidence of extravasation or communication with the cystic mass. On drainage films all the contrast was gone.   We went ahead and performed cold cup biopsies along the edges of this inflammatory mass. Bugbee electrode was then used for fulguration. The bladder was emptied and lidocaine jelly instilled. The patient was brought to PACU in stable condition.

## 2015-08-21 NOTE — Anesthesia Preprocedure Evaluation (Addendum)
Anesthesia Evaluation  Patient identified by MRN, date of birth, ID band Patient awake    Reviewed: Allergy & Precautions, NPO status , Patient's Chart, lab work & pertinent test results, reviewed documented beta blocker date and time   History of Anesthesia Complications Negative for: history of anesthetic complications  Airway Mallampati: II  TM Distance: >3 FB Neck ROM: Full    Dental  (+) Teeth Intact, Dental Advisory Given   Pulmonary neg pulmonary ROS,    Pulmonary exam normal breath sounds clear to auscultation       Cardiovascular hypertension, Pt. on medications and Pt. on home beta blockers (-) angina(-) CAD and (-) Past MI Normal cardiovascular exam+ dysrhythmias Atrial Fibrillation  Rhythm:Regular Rate:Normal  01/10/15 TTE: Study Conclusions  - Left ventricle: The cavity size was normal. Wall thickness wasnormal. Systolic function was normal. The estimated ejectionfraction was in the range of 55% to 60%. Wall motion was normal;there were no regional wall motion abnormalities. Dopplerparameters are consistent with abnormal left ventricularrelaxation (grade 1 diastolic dysfunction). - Aortic valve: There was moderate regurgitation. - Pulmonary arteries: Systolic pressure was mildly increased. PApeak pressure: 37 mm Hg (S).  Impressions:  - Normal LV function; grade 1 diastolic dysfunction; moderate AI,trace MR, mild TR; mildly elevated pulmonary pressure.   Neuro/Psych PSYCHIATRIC DISORDERS Depression negative neurological ROS     GI/Hepatic Neg liver ROS, GERD  Medicated,  Endo/Other  Hypothyroidism   Renal/GU negative Renal ROS     Musculoskeletal  (+) Arthritis , Osteoarthritis,    Abdominal   Peds  Hematology  (+) Blood dyscrasia, , Factor V Lieden On coumadin for A-fib; INR 1.05 on 1/10   Anesthesia Other Findings Day of surgery medications reviewed with the patient.  History of breast ca,  bladder tumor, hematuria, and pelvic mass  Reproductive/Obstetrics negative OB ROS                         Anesthesia Physical Anesthesia Plan  ASA: III  Anesthesia Plan: General   Post-op Pain Management:    Induction: Intravenous  Airway Management Planned: LMA  Additional Equipment:   Intra-op Plan:   Post-operative Plan: Extubation in OR  Informed Consent: I have reviewed the patients History and Physical, chart, labs and discussed the procedure including the risks, benefits and alternatives for the proposed anesthesia with the patient or authorized representative who has indicated his/her understanding and acceptance.   Dental advisory given  Plan Discussed with: CRNA  Anesthesia Plan Comments: (Risks/benefits of general anesthesia discussed with patient including risk of damage to teeth, lips, gum, and tongue, nausea/vomiting, allergic reactions to medications, and the possibility of heart attack, stroke and death.  All patient questions answered.  Patient wishes to proceed.)       Anesthesia Quick Evaluation

## 2015-08-21 NOTE — Anesthesia Procedure Notes (Signed)
Procedure Name: LMA Insertion Date/Time: 08/21/2015 8:38 AM Performed by: Mechele Claude Pre-anesthesia Checklist: Patient identified, Emergency Drugs available, Suction available and Patient being monitored Patient Re-evaluated:Patient Re-evaluated prior to inductionOxygen Delivery Method: Circle System Utilized Preoxygenation: Pre-oxygenation with 100% oxygen Intubation Type: IV induction Ventilation: Mask ventilation without difficulty LMA: LMA inserted LMA Size: 4.0 Number of attempts: 1 Airway Equipment and Method: bite block Placement Confirmation: positive ETCO2 Tube secured with: Tape Dental Injury: Teeth and Oropharynx as per pre-operative assessment

## 2015-08-21 NOTE — Discharge Instructions (Addendum)

## 2015-08-22 ENCOUNTER — Encounter (HOSPITAL_BASED_OUTPATIENT_CLINIC_OR_DEPARTMENT_OTHER): Payer: Self-pay | Admitting: Urology

## 2015-08-27 DIAGNOSIS — Z96651 Presence of right artificial knee joint: Secondary | ICD-10-CM | POA: Diagnosis not present

## 2015-08-27 DIAGNOSIS — S81001A Unspecified open wound, right knee, initial encounter: Secondary | ICD-10-CM | POA: Diagnosis not present

## 2015-08-27 DIAGNOSIS — L905 Scar conditions and fibrosis of skin: Secondary | ICD-10-CM | POA: Diagnosis not present

## 2015-09-02 DIAGNOSIS — S81001D Unspecified open wound, right knee, subsequent encounter: Secondary | ICD-10-CM | POA: Diagnosis not present

## 2015-09-02 DIAGNOSIS — Z96651 Presence of right artificial knee joint: Secondary | ICD-10-CM | POA: Diagnosis not present

## 2015-09-03 DIAGNOSIS — R1900 Intra-abdominal and pelvic swelling, mass and lump, unspecified site: Secondary | ICD-10-CM | POA: Diagnosis not present

## 2015-09-03 DIAGNOSIS — N200 Calculus of kidney: Secondary | ICD-10-CM | POA: Diagnosis not present

## 2015-09-04 ENCOUNTER — Ambulatory Visit (INDEPENDENT_AMBULATORY_CARE_PROVIDER_SITE_OTHER): Payer: Medicare Other | Admitting: Pharmacist

## 2015-09-04 DIAGNOSIS — N210 Calculus in bladder: Secondary | ICD-10-CM | POA: Diagnosis not present

## 2015-09-04 DIAGNOSIS — I480 Paroxysmal atrial fibrillation: Secondary | ICD-10-CM | POA: Diagnosis not present

## 2015-09-04 DIAGNOSIS — N3020 Other chronic cystitis without hematuria: Secondary | ICD-10-CM | POA: Diagnosis not present

## 2015-09-04 DIAGNOSIS — Z86711 Personal history of pulmonary embolism: Secondary | ICD-10-CM

## 2015-09-04 DIAGNOSIS — Z0000 Encounter for general adult medical examination without abnormal findings: Secondary | ICD-10-CM | POA: Diagnosis not present

## 2015-09-04 DIAGNOSIS — D6851 Activated protein C resistance: Secondary | ICD-10-CM

## 2015-09-04 DIAGNOSIS — R310 Gross hematuria: Secondary | ICD-10-CM | POA: Diagnosis not present

## 2015-09-04 DIAGNOSIS — Z5181 Encounter for therapeutic drug level monitoring: Secondary | ICD-10-CM | POA: Diagnosis not present

## 2015-09-04 LAB — POCT INR: INR: 1.7

## 2015-09-09 DIAGNOSIS — Z96651 Presence of right artificial knee joint: Secondary | ICD-10-CM | POA: Diagnosis not present

## 2015-09-09 DIAGNOSIS — S81001D Unspecified open wound, right knee, subsequent encounter: Secondary | ICD-10-CM | POA: Diagnosis not present

## 2015-09-18 ENCOUNTER — Ambulatory Visit (INDEPENDENT_AMBULATORY_CARE_PROVIDER_SITE_OTHER): Payer: Medicare Other | Admitting: *Deleted

## 2015-09-18 DIAGNOSIS — I480 Paroxysmal atrial fibrillation: Secondary | ICD-10-CM

## 2015-09-18 DIAGNOSIS — Z5181 Encounter for therapeutic drug level monitoring: Secondary | ICD-10-CM | POA: Diagnosis not present

## 2015-09-18 DIAGNOSIS — Z86711 Personal history of pulmonary embolism: Secondary | ICD-10-CM

## 2015-09-18 LAB — POCT INR: INR: 1.8

## 2015-09-24 DIAGNOSIS — N390 Urinary tract infection, site not specified: Secondary | ICD-10-CM | POA: Diagnosis not present

## 2015-09-24 DIAGNOSIS — N3020 Other chronic cystitis without hematuria: Secondary | ICD-10-CM | POA: Diagnosis not present

## 2015-09-26 ENCOUNTER — Other Ambulatory Visit: Payer: Self-pay

## 2015-09-26 DIAGNOSIS — Z1231 Encounter for screening mammogram for malignant neoplasm of breast: Secondary | ICD-10-CM

## 2015-09-27 ENCOUNTER — Ambulatory Visit (INDEPENDENT_AMBULATORY_CARE_PROVIDER_SITE_OTHER): Payer: Medicare Other | Admitting: *Deleted

## 2015-09-27 DIAGNOSIS — Z86711 Personal history of pulmonary embolism: Secondary | ICD-10-CM

## 2015-09-27 DIAGNOSIS — D6851 Activated protein C resistance: Secondary | ICD-10-CM | POA: Diagnosis not present

## 2015-09-27 DIAGNOSIS — Z5181 Encounter for therapeutic drug level monitoring: Secondary | ICD-10-CM | POA: Diagnosis not present

## 2015-09-27 DIAGNOSIS — I480 Paroxysmal atrial fibrillation: Secondary | ICD-10-CM | POA: Diagnosis not present

## 2015-09-27 LAB — POCT INR: INR: 2.4

## 2015-10-18 ENCOUNTER — Ambulatory Visit (INDEPENDENT_AMBULATORY_CARE_PROVIDER_SITE_OTHER): Payer: Medicare Other | Admitting: Surgery

## 2015-10-18 DIAGNOSIS — Z86711 Personal history of pulmonary embolism: Secondary | ICD-10-CM

## 2015-10-18 DIAGNOSIS — Z5181 Encounter for therapeutic drug level monitoring: Secondary | ICD-10-CM

## 2015-10-18 DIAGNOSIS — D6851 Activated protein C resistance: Secondary | ICD-10-CM

## 2015-10-18 DIAGNOSIS — I480 Paroxysmal atrial fibrillation: Secondary | ICD-10-CM

## 2015-10-18 LAB — POCT INR: INR: 1.9

## 2015-10-28 ENCOUNTER — Other Ambulatory Visit: Payer: Self-pay | Admitting: Cardiology

## 2015-10-31 ENCOUNTER — Ambulatory Visit: Payer: Medicare Other

## 2015-11-14 ENCOUNTER — Ambulatory Visit: Payer: Medicare Other

## 2015-11-15 ENCOUNTER — Ambulatory Visit (INDEPENDENT_AMBULATORY_CARE_PROVIDER_SITE_OTHER): Payer: Medicare Other | Admitting: Pharmacist

## 2015-11-15 ENCOUNTER — Other Ambulatory Visit (HOSPITAL_COMMUNITY): Payer: Medicare Other

## 2015-11-15 DIAGNOSIS — D6851 Activated protein C resistance: Secondary | ICD-10-CM | POA: Diagnosis not present

## 2015-11-15 DIAGNOSIS — Z5181 Encounter for therapeutic drug level monitoring: Secondary | ICD-10-CM | POA: Diagnosis not present

## 2015-11-15 DIAGNOSIS — I480 Paroxysmal atrial fibrillation: Secondary | ICD-10-CM

## 2015-11-15 DIAGNOSIS — Z86711 Personal history of pulmonary embolism: Secondary | ICD-10-CM | POA: Diagnosis not present

## 2015-11-15 LAB — POCT INR: INR: 3.2

## 2015-11-28 ENCOUNTER — Ambulatory Visit (HOSPITAL_COMMUNITY): Payer: Medicare Other | Attending: Cardiology

## 2015-11-28 ENCOUNTER — Ambulatory Visit (INDEPENDENT_AMBULATORY_CARE_PROVIDER_SITE_OTHER): Payer: Medicare Other | Admitting: *Deleted

## 2015-11-28 ENCOUNTER — Other Ambulatory Visit: Payer: Self-pay

## 2015-11-28 DIAGNOSIS — Z5181 Encounter for therapeutic drug level monitoring: Secondary | ICD-10-CM | POA: Diagnosis not present

## 2015-11-28 DIAGNOSIS — D6851 Activated protein C resistance: Secondary | ICD-10-CM

## 2015-11-28 DIAGNOSIS — I351 Nonrheumatic aortic (valve) insufficiency: Secondary | ICD-10-CM | POA: Diagnosis not present

## 2015-11-28 DIAGNOSIS — I119 Hypertensive heart disease without heart failure: Secondary | ICD-10-CM | POA: Insufficient documentation

## 2015-11-28 DIAGNOSIS — I340 Nonrheumatic mitral (valve) insufficiency: Secondary | ICD-10-CM | POA: Insufficient documentation

## 2015-11-28 DIAGNOSIS — I071 Rheumatic tricuspid insufficiency: Secondary | ICD-10-CM | POA: Diagnosis not present

## 2015-11-28 DIAGNOSIS — I480 Paroxysmal atrial fibrillation: Secondary | ICD-10-CM | POA: Diagnosis not present

## 2015-11-28 DIAGNOSIS — Z86711 Personal history of pulmonary embolism: Secondary | ICD-10-CM

## 2015-11-28 LAB — POCT INR: INR: 3.4

## 2015-11-29 ENCOUNTER — Telehealth: Payer: Self-pay

## 2015-11-29 DIAGNOSIS — I351 Nonrheumatic aortic (valve) insufficiency: Secondary | ICD-10-CM

## 2015-11-29 DIAGNOSIS — I480 Paroxysmal atrial fibrillation: Secondary | ICD-10-CM

## 2015-11-29 NOTE — Telephone Encounter (Signed)
-----   Message from Sueanne Margarita, MD sent at 11/28/2015  5:38 PM EDT ----- Echo showed normal LVF with moderate AR- repeat echo in 1 year

## 2015-11-29 NOTE — Telephone Encounter (Signed)
Informed patient of results and verbal understanding expressed.  Repeat ECHO ordered to be scheduled in 1 year. Patient agrees with treatment plan. 

## 2015-12-06 DIAGNOSIS — N210 Calculus in bladder: Secondary | ICD-10-CM | POA: Diagnosis not present

## 2015-12-06 DIAGNOSIS — Z0000 Encounter for general adult medical examination without abnormal findings: Secondary | ICD-10-CM | POA: Diagnosis not present

## 2015-12-06 DIAGNOSIS — R310 Gross hematuria: Secondary | ICD-10-CM | POA: Diagnosis not present

## 2015-12-06 DIAGNOSIS — N3020 Other chronic cystitis without hematuria: Secondary | ICD-10-CM | POA: Diagnosis not present

## 2015-12-06 DIAGNOSIS — R1900 Intra-abdominal and pelvic swelling, mass and lump, unspecified site: Secondary | ICD-10-CM | POA: Diagnosis not present

## 2015-12-10 ENCOUNTER — Encounter: Payer: Self-pay | Admitting: Cardiology

## 2015-12-10 ENCOUNTER — Ambulatory Visit (INDEPENDENT_AMBULATORY_CARE_PROVIDER_SITE_OTHER): Payer: Medicare Other | Admitting: Cardiology

## 2015-12-10 ENCOUNTER — Ambulatory Visit (INDEPENDENT_AMBULATORY_CARE_PROVIDER_SITE_OTHER): Payer: Medicare Other | Admitting: *Deleted

## 2015-12-10 VITALS — BP 138/78 | HR 60 | Ht 65.5 in | Wt 144.4 lb

## 2015-12-10 DIAGNOSIS — I480 Paroxysmal atrial fibrillation: Secondary | ICD-10-CM

## 2015-12-10 DIAGNOSIS — I351 Nonrheumatic aortic (valve) insufficiency: Secondary | ICD-10-CM | POA: Diagnosis not present

## 2015-12-10 DIAGNOSIS — Z86711 Personal history of pulmonary embolism: Secondary | ICD-10-CM | POA: Diagnosis not present

## 2015-12-10 DIAGNOSIS — Z5181 Encounter for therapeutic drug level monitoring: Secondary | ICD-10-CM

## 2015-12-10 DIAGNOSIS — I10 Essential (primary) hypertension: Secondary | ICD-10-CM

## 2015-12-10 DIAGNOSIS — D6851 Activated protein C resistance: Secondary | ICD-10-CM

## 2015-12-10 LAB — POCT INR: INR: 2.5

## 2015-12-10 NOTE — Patient Instructions (Signed)

## 2015-12-10 NOTE — Progress Notes (Signed)
Cardiology Office Note    Date:  12/10/2015   ID:  Jamie Coffey, DOB 1941/06/14, MRN [id number]  PCP:  [name], MD  Cardiologist:  Sueanne Margarita, MD   Chief Complaint  Patient presents with  . Atrial Fibrillation  . Hypertension    History of Present Illness:  Jamie Coffey is a 75 y.o. female with a history of PAF, HTN, Moderate AR and diastolic dysfunction who presents today for followup. She denies any chest pain, SOB, LE edema, dizziness or syncope. She occasionally will have a skipped heart beat but no PAF.  She walks for exercise.   Past Medical History  Diagnosis Date  . PAF (paroxysmal atrial fibrillation) ([place])     a. 05/2011 Echo: EF 70%, triv MR, mild AI, mod TR, Gr 1 DD.  . Factor 5 Leiden mutation, heterozygous ([place])   . GERD (gastroesophageal reflux disease)   . History of DVT & PE ~ 2000    S/P RLE knee scope  . Hypertension   . Hypothyroidism   . H/O hiatal hernia   . History of kidney stones   . Depression   . Aortic valve insufficiency 05/27/2015    Moderate by echo  . Family history of adverse reaction to anesthesia     mother developed irreg heart rate during surgery  . Gross hematuria   . Pelvic mass   . 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 History of benign bladder tumor   . Delayed surgical wound healing     03-25-2015  S/P   RIGHT TOTAL KNEE--  DID HAVE WOUND-VAC NOW DOWN TO JUST LARGE BAND-AID  . Sleep deprivation   . Nephrolithiasis     non-obstructive  . Frequency of urination   . Urgency of urination   . Bursitis of hip   . Arthritis     knees  . Anticoagulated on Coumadin     Past Surgical History  Procedure Laterality Date  . Knee arthroscopy Bilateral right 05-29-2003/  left 2009  . Dilation and curettage of uterus  1980's  . Tubal ligation  1980's  . Left heart catheterization with coronary angiogram N/A  01/19/2014    Procedure: LEFT HEART CATHETERIZATION WITH CORONARY ANGIOGRAM;  Surgeon: [name], MD;  [facility]: [facility];  Service: Cardiovascular;  Laterality: N/A;  Mild to moderate CAD involving , mLAD and  D2 branch/  preserved LVF, ef 65%  . Total knee arthroplasty Right 03/25/2015    Procedure: RIGHT TOTAL KNEE ARTHROPLASTY;  Surgeon: Paralee Cancel, MD;  Location: [facility];  Service: Orthopedics;  Laterality: Right;  . Breast biopsy Right 2010  . Partial mastectomy with axillary sentinel lymph node biopsy Left 08-05-2009  . Cysto/  bilateral retrograde pyelogram/  bladder bx and fulgeration  01-07-2007  . Cysto/  retrograde pyelogram/  right ureteral stent placement  09-15-2007  . Cardiovascular stress test  12-11-2013   dr Tressia Miners turner    normal nuclear study/  no ischemia, normal LV function and wall function, ef 65%  . Transthoracic echocardiogram  01-10-2015    grade 1 diastolic dysfunction, ef [id number]  moderate AR/  trivial MR and PR/  mild TR/  PA  systolic pressure increase (peak pressure 72mmHg S)  . Cataract extraction w/ intraocular lens  implant, bilateral  2012  . Tonsillectomy  child  . Abdominal hysterectomy  1983  . Negative sleep study  PER PT  . Cystoscopy with biopsy N/A 08/21/2015    Procedure: CYSTOSCOPY WITH BLADDER BIOPSY;  Surgeon: Rana Snare, MD;  Location: [facility];  Service: Urology;  Laterality: N/A;  . Cystogram N/A 08/21/2015    Procedure: CYSTOGRAM;  Surgeon: Rana Snare, MD;  Location[facility];  Service: Urology;  Laterality: N/A;  . Holmium laser application  [id number]    Procedure: HOLMIUM LASER APPLICATION;  Surgeon: Rana Snare, MD;  Location: [facility];  Service: Urology;;    Current Medications: Outpatient Prescriptions Prior to Visit  Medication Sig Dispense Refill  . celecoxib (CELEBREX) 200 MG capsule Take 200 mg by mouth daily as needed for mild pain.     Marland Kitchen levothyroxine (SYNTHROID,  LEVOTHROID) 75 MCG tablet Take 75 mcg by mouth daily before breakfast. PT HAS TO TAKE NAME BRAND    . venlafaxine XR (EFFEXOR-XR) 75 MG 24 hr capsule Take 75 mg by mouth at bedtime.   2  . warfarin (COUMADIN) 5 MG tablet TAKE AS DIRECTED BY COUMADIN CLINIC 30 tablet 3  . zolpidem (AMBIEN) 10 MG tablet Take 10 mg by mouth at bedtime as needed for sleep.     Marland Kitchen amLODipine (NORVASC) 2.5 MG tablet TAKE 1 TABLET BY MOUTH ONCE DAILY (Patient taking differently: TAKE 1 TABLET BY MOUTH ONCE DAILY--   takes in pm) 30 tablet 10  . BYSTOLIC 2.5 MG tablet TAKE 1 TABLET BY MOUTH EVERY DAY (Patient taking differently: TAKE 1 TABLET BY MOUTH EVERY DAY--- takes in am) 30 tablet 8   No facility-administered medications prior to visit.     Allergies:   Ciprofloxacin hcl   Social History   Social History  . Marital Status: Married    Spouse Name: N/A  . Number of Children: N/A  . Years of Education: N/A   Social History Main Topics  . Smoking status: Never Smoker   . Smokeless tobacco: Never Used  . Alcohol Use: Yes     Comment: occasional  . Drug Use: No  . Sexual Activity: Not Asked   Other Topics Concern  . None   Social History Narrative   Lives in [place] with husband.  Retired.  Walks up to 4 days/wk - 15 min miles.     Family History:  The patient's family history includes Heart attack in her mother; Other in her brother and father.   ROS:   Please see the history of present illness.    ROS All other systems reviewed and are negative.   PHYSICAL EXAM:   VS:  BP 138/78 mmHg  Pulse 60  Ht 5' 5.5" (1.664 m)  Wt 144 lb 6.4 oz (65.499 kg)  BMI 23.66 kg/m2   GEN: Well nourished, well developed, in no acute distress HEENT: normal Neck: no JVD, carotid bruits, or masses Cardiac: RRR; no murmurs, rubs, or gallops,no edema.  Intact distal pulses bilaterally.  Respiratory:  clear to auscultation bilaterally, normal work of breathing GI: soft, nontender, nondistended, + BS MS: no deformity  or atrophy Skin: warm and dry, no rash Neuro:  Alert and Oriented x 3, Strength and sensation are intact Psych: euthymic mood, full affect  Wt Readings from Last 3 Encounters:  12/10/15 144 lb 6.4 oz (65.499 kg)  08/21/15 146 lb (66.225 kg)  06/10/15 140 lb (63.504 kg)      Studies/Labs Reviewed:   EKG:  EKG is not  ordered today.   Recent Labs: 04/22/2015: B Natriuretic Peptide 160.6* 08/20/2015: BUN 17; Creatinine, Ser 0.82; Hemoglobin 13.1; Platelets 263; Potassium 3.6; Sodium 140   Lipid Panel    Component Value Date/Time   CHOL 198 01/19/2014 0347   TRIG 40 01/19/2014 0347   HDL 105 01/19/2014 0347   CHOLHDL 1.9 01/19/2014 0347   VLDL 8 01/19/2014 0347   LDLCALC 85 01/19/2014 0347    Additional studies/ records that were reviewed today include:  none    ASSESSMENT:    1. PAF (paroxysmal atrial fibrillation) ([place])   2. Essential hypertension   3. Aortic valve insufficiency      PLAN:  In order of problems listed above:  1. PAF - maintaining NSR on Bystolic.  Continue warfarin and Bystolic. 2. HTN - BP well controlled on Bystolic.  Continue current dose. 3.   Moderate AI by echo which is stable -repeat echo in 1 year   Medication Adjustments/Labs and Tests Ordered: Current medicines are reviewed at length with the patient today.  Concerns regarding medicines are outlined above.  Medication changes, Labs and Tests ordered today are listed in the Patient Instructions below.   Lurena Nida, MD  12/10/2015 11:01 AM    [address], [place], [place]  [address] Phone: [phone]; Fax: [phone]

## 2016-01-01 ENCOUNTER — Ambulatory Visit (INDEPENDENT_AMBULATORY_CARE_PROVIDER_SITE_OTHER): Payer: Medicare Other | Admitting: Surgery

## 2016-01-01 DIAGNOSIS — D6851 Activated protein C resistance: Secondary | ICD-10-CM

## 2016-01-01 DIAGNOSIS — Z5181 Encounter for therapeutic drug level monitoring: Secondary | ICD-10-CM

## 2016-01-01 DIAGNOSIS — I480 Paroxysmal atrial fibrillation: Secondary | ICD-10-CM

## 2016-01-01 DIAGNOSIS — Z86711 Personal history of pulmonary embolism: Secondary | ICD-10-CM | POA: Diagnosis not present

## 2016-01-01 LAB — POCT INR: INR: 2.2

## 2016-01-13 DIAGNOSIS — N390 Urinary tract infection, site not specified: Secondary | ICD-10-CM | POA: Diagnosis not present

## 2016-01-13 DIAGNOSIS — I10 Essential (primary) hypertension: Secondary | ICD-10-CM | POA: Diagnosis not present

## 2016-01-13 DIAGNOSIS — E038 Other specified hypothyroidism: Secondary | ICD-10-CM | POA: Diagnosis not present

## 2016-01-13 DIAGNOSIS — R8299 Other abnormal findings in urine: Secondary | ICD-10-CM | POA: Diagnosis not present

## 2016-01-13 DIAGNOSIS — E784 Other hyperlipidemia: Secondary | ICD-10-CM | POA: Diagnosis not present

## 2016-01-20 DIAGNOSIS — F329 Major depressive disorder, single episode, unspecified: Secondary | ICD-10-CM | POA: Diagnosis not present

## 2016-01-20 DIAGNOSIS — M25562 Pain in left knee: Secondary | ICD-10-CM | POA: Diagnosis not present

## 2016-01-20 DIAGNOSIS — Z1389 Encounter for screening for other disorder: Secondary | ICD-10-CM | POA: Diagnosis not present

## 2016-01-20 DIAGNOSIS — E038 Other specified hypothyroidism: Secondary | ICD-10-CM | POA: Diagnosis not present

## 2016-01-20 DIAGNOSIS — Z7901 Long term (current) use of anticoagulants: Secondary | ICD-10-CM | POA: Diagnosis not present

## 2016-01-20 DIAGNOSIS — Z0000 Encounter for general adult medical examination without abnormal findings: Secondary | ICD-10-CM | POA: Diagnosis not present

## 2016-01-20 DIAGNOSIS — C50912 Malignant neoplasm of unspecified site of left female breast: Secondary | ICD-10-CM | POA: Diagnosis not present

## 2016-01-20 DIAGNOSIS — E784 Other hyperlipidemia: Secondary | ICD-10-CM | POA: Diagnosis not present

## 2016-01-20 DIAGNOSIS — I480 Paroxysmal atrial fibrillation: Secondary | ICD-10-CM | POA: Diagnosis not present

## 2016-01-30 ENCOUNTER — Ambulatory Visit (INDEPENDENT_AMBULATORY_CARE_PROVIDER_SITE_OTHER): Payer: Medicare Other | Admitting: *Deleted

## 2016-01-30 DIAGNOSIS — Z86711 Personal history of pulmonary embolism: Secondary | ICD-10-CM

## 2016-01-30 DIAGNOSIS — I480 Paroxysmal atrial fibrillation: Secondary | ICD-10-CM | POA: Diagnosis not present

## 2016-01-30 DIAGNOSIS — Z5181 Encounter for therapeutic drug level monitoring: Secondary | ICD-10-CM

## 2016-01-30 DIAGNOSIS — D6851 Activated protein C resistance: Secondary | ICD-10-CM

## 2016-01-30 LAB — POCT INR: INR: 3.2

## 2016-01-31 DIAGNOSIS — Z1212 Encounter for screening for malignant neoplasm of rectum: Secondary | ICD-10-CM | POA: Diagnosis not present

## 2016-02-05 DIAGNOSIS — H01111 Allergic dermatitis of right upper eyelid: Secondary | ICD-10-CM | POA: Diagnosis not present

## 2016-02-05 DIAGNOSIS — H01114 Allergic dermatitis of left upper eyelid: Secondary | ICD-10-CM | POA: Diagnosis not present

## 2016-02-12 ENCOUNTER — Ambulatory Visit (INDEPENDENT_AMBULATORY_CARE_PROVIDER_SITE_OTHER): Payer: Medicare Other | Admitting: Pharmacist

## 2016-02-12 DIAGNOSIS — I480 Paroxysmal atrial fibrillation: Secondary | ICD-10-CM

## 2016-02-12 DIAGNOSIS — D6851 Activated protein C resistance: Secondary | ICD-10-CM

## 2016-02-12 DIAGNOSIS — Z5181 Encounter for therapeutic drug level monitoring: Secondary | ICD-10-CM | POA: Diagnosis not present

## 2016-02-12 DIAGNOSIS — Z86711 Personal history of pulmonary embolism: Secondary | ICD-10-CM

## 2016-02-12 LAB — POCT INR: INR: 3.1

## 2016-02-13 DIAGNOSIS — M1712 Unilateral primary osteoarthritis, left knee: Secondary | ICD-10-CM | POA: Diagnosis not present

## 2016-03-04 ENCOUNTER — Encounter (INDEPENDENT_AMBULATORY_CARE_PROVIDER_SITE_OTHER): Payer: Self-pay

## 2016-03-04 ENCOUNTER — Ambulatory Visit (INDEPENDENT_AMBULATORY_CARE_PROVIDER_SITE_OTHER): Payer: Medicare Other | Admitting: Pharmacist

## 2016-03-04 DIAGNOSIS — Z5181 Encounter for therapeutic drug level monitoring: Secondary | ICD-10-CM | POA: Diagnosis not present

## 2016-03-04 DIAGNOSIS — I480 Paroxysmal atrial fibrillation: Secondary | ICD-10-CM

## 2016-03-04 DIAGNOSIS — Z86711 Personal history of pulmonary embolism: Secondary | ICD-10-CM

## 2016-03-04 DIAGNOSIS — D6851 Activated protein C resistance: Secondary | ICD-10-CM | POA: Diagnosis not present

## 2016-03-04 LAB — POCT INR: INR: 3.4

## 2016-03-05 ENCOUNTER — Telehealth: Payer: Self-pay | Admitting: Cardiology

## 2016-03-05 NOTE — Telephone Encounter (Signed)
New message        Pt c/o medication issue:  1. Name of Medication: bystolic(this is the new med)  2. How are you currently taking this medication (dosage and times per day)? 2.5 mg po daily  3. Are you having a reaction (difficulty breathing--STAT)? no  4. What is your medication issue? The pt went to get her medication from the pharmacy and was given 2 different medication a new medication   Pt c/o medication issue:  1. Name of Medication: amlodipine  2. How are you currently taking this medication (dosage and times per day)? 2.5 mg po daily  3. Are you having a reaction (difficulty breathing--STAT)? no  4. What is your medication issue? The pt is question is is she suppose to take the Bystolic or not she was never instructed by the Md to start the medication

## 2016-03-05 NOTE — Telephone Encounter (Signed)
Spoke with patient who states she has been taking amlodipine for quite some time.  States when she went to pick up prescriptions there was an additional Rx, Bystolic.  She states she has not been taking bystolic for quite some time.  I advised that according to Dr. Theodosia Blender last office visit note on 5/2, she thought patient was taking bystolic 2.5 mg.  Patient states she has not been taking it for several months.  BP at last ov was 138/78 which allows for some room to improve.  I advised her to take the bystolic, monitor BP and to call back with lower than normal readings and/or if she experiences any light-headedness, dizziness, or other concerns.  She verbalized understanding and agreement and thanked me for the call.

## 2016-03-10 ENCOUNTER — Other Ambulatory Visit: Payer: Self-pay | Admitting: Cardiology

## 2016-03-25 ENCOUNTER — Ambulatory Visit (INDEPENDENT_AMBULATORY_CARE_PROVIDER_SITE_OTHER): Payer: Medicare Other | Admitting: Pharmacist

## 2016-03-25 DIAGNOSIS — Z5181 Encounter for therapeutic drug level monitoring: Secondary | ICD-10-CM

## 2016-03-25 DIAGNOSIS — I480 Paroxysmal atrial fibrillation: Secondary | ICD-10-CM

## 2016-03-25 DIAGNOSIS — D6851 Activated protein C resistance: Secondary | ICD-10-CM | POA: Diagnosis not present

## 2016-03-25 DIAGNOSIS — H35372 Puckering of macula, left eye: Secondary | ICD-10-CM | POA: Diagnosis not present

## 2016-03-25 DIAGNOSIS — Z86711 Personal history of pulmonary embolism: Secondary | ICD-10-CM | POA: Diagnosis not present

## 2016-03-25 DIAGNOSIS — Z961 Presence of intraocular lens: Secondary | ICD-10-CM | POA: Diagnosis not present

## 2016-03-25 LAB — POCT INR: INR: 2.3

## 2016-04-23 ENCOUNTER — Ambulatory Visit (INDEPENDENT_AMBULATORY_CARE_PROVIDER_SITE_OTHER): Payer: Medicare Other | Admitting: *Deleted

## 2016-04-23 DIAGNOSIS — D6851 Activated protein C resistance: Secondary | ICD-10-CM

## 2016-04-23 DIAGNOSIS — Z86711 Personal history of pulmonary embolism: Secondary | ICD-10-CM | POA: Diagnosis not present

## 2016-04-23 DIAGNOSIS — I480 Paroxysmal atrial fibrillation: Secondary | ICD-10-CM

## 2016-04-23 DIAGNOSIS — Z5181 Encounter for therapeutic drug level monitoring: Secondary | ICD-10-CM | POA: Diagnosis not present

## 2016-04-23 LAB — POCT INR: INR: 2.8

## 2016-05-11 DIAGNOSIS — M25552 Pain in left hip: Secondary | ICD-10-CM | POA: Diagnosis not present

## 2016-05-12 ENCOUNTER — Other Ambulatory Visit: Payer: Self-pay | Admitting: Internal Medicine

## 2016-05-12 DIAGNOSIS — Z853 Personal history of malignant neoplasm of breast: Secondary | ICD-10-CM

## 2016-05-21 ENCOUNTER — Ambulatory Visit (INDEPENDENT_AMBULATORY_CARE_PROVIDER_SITE_OTHER): Payer: Medicare Other

## 2016-05-21 DIAGNOSIS — Z5181 Encounter for therapeutic drug level monitoring: Secondary | ICD-10-CM | POA: Diagnosis not present

## 2016-05-21 DIAGNOSIS — D6851 Activated protein C resistance: Secondary | ICD-10-CM

## 2016-05-21 DIAGNOSIS — Z86711 Personal history of pulmonary embolism: Secondary | ICD-10-CM | POA: Diagnosis not present

## 2016-05-21 DIAGNOSIS — I480 Paroxysmal atrial fibrillation: Secondary | ICD-10-CM | POA: Diagnosis not present

## 2016-05-21 LAB — POCT INR: INR: 2.1

## 2016-05-28 ENCOUNTER — Ambulatory Visit
Admission: RE | Admit: 2016-05-28 | Discharge: 2016-05-28 | Disposition: A | Discharge status: Home / Self Care | Payer: Medicare Other | Source: Ambulatory Visit | Attending: Internal Medicine | Admitting: Internal Medicine

## 2016-05-28 DIAGNOSIS — R922 Inconclusive mammogram: Secondary | ICD-10-CM | POA: Diagnosis not present

## 2016-05-28 DIAGNOSIS — Z853 Personal history of malignant neoplasm of breast: Secondary | ICD-10-CM

## 2016-05-29 ENCOUNTER — Other Ambulatory Visit: Payer: Self-pay | Admitting: Cardiology

## 2016-06-08 ENCOUNTER — Ambulatory Visit (INDEPENDENT_AMBULATORY_CARE_PROVIDER_SITE_OTHER): Payer: Medicare Other | Admitting: Cardiology

## 2016-06-08 ENCOUNTER — Encounter: Payer: Self-pay | Admitting: Cardiology

## 2016-06-08 VITALS — BP 128/62 | HR 61 | Ht 65.5 in | Wt 150.0 lb

## 2016-06-08 DIAGNOSIS — I481 Persistent atrial fibrillation: Secondary | ICD-10-CM | POA: Diagnosis not present

## 2016-06-08 DIAGNOSIS — I351 Nonrheumatic aortic (valve) insufficiency: Secondary | ICD-10-CM

## 2016-06-08 DIAGNOSIS — I4819 Other persistent atrial fibrillation: Secondary | ICD-10-CM

## 2016-06-08 DIAGNOSIS — I10 Essential (primary) hypertension: Secondary | ICD-10-CM

## 2016-06-08 NOTE — Progress Notes (Signed)
Cardiology Office Note    Date:  06/08/2016   ID:  Jamie Coffey, DOB Feb 13, 1941, MRN [id number]  PCP:  [name], MD  Cardiologist:  Fransico Him, MD   Chief Complaint  Patient presents with  . Aortic Insuffiency  . Hypertension  . Atrial Fibrillation    History of Present Illness:  Jamie Coffey is a 75 y.o. female with a history of PAF, HTN, Moderate AR and diastolic dysfunction who presents today for followup. She denies any chest pain, SOB, dizziness or syncope. She does not think that she has had any afib recently.   She walks for exercise.  She occasionally has some LE edema in the leg that she had knee surgery on if she has been on her feet a while or with long travel.     Past Medical History:  Diagnosis Date  . Anticoagulated on Coumadin   . Aortic valve insufficiency 05/27/2015   Moderate by echo  . Arthritis    knees  . Bursitis of hip   . Delayed surgical wound healing    03-25-2015  S/P   RIGHT TOTAL KNEE--  DID HAVE WOUND-VAC NOW DOWN TO JUST LARGE BAND-AID  . Depression   . Factor 5 Leiden mutation, heterozygous ([place])   . Family history of adverse reaction to anesthesia    mother developed irreg heart rate during surgery  . Frequency of urination   . GERD (gastroesophageal reflux disease)   . Gross hematuria   . H/O hiatal hernia   . History of benign bladder tumor   . 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 History of DVT & PE ~ 2000   S/P RLE knee scope  . History of kidney stones   . Hypertension   . Hypothyroidism   . Nephrolithiasis    non-obstructive  . Pelvic mass   . Permanent atrial fibrillation ([place])    a. 05/2011 Echo: EF 70%, triv MR, mild AI, mod TR, Gr 1 DD.  Marland Kitchen Sleep deprivation   . Urgency of urination     Past Surgical History:  Procedure Laterality Date  . ABDOMINAL HYSTERECTOMY  1983  . BREAST BIOPSY Right  2010  . CARDIOVASCULAR STRESS TEST  12-11-2013   dr Tressia Miners Shaarav Ripple   normal nuclear study/  no ischemia, normal LV function and wall function, ef 65%  . CATARACT EXTRACTION W/ INTRAOCULAR LENS  IMPLANT, BILATERAL  2012  . CYSTO/  BILATERAL RETROGRADE PYELOGRAM/  BLADDER BX AND FULGERATION  01-07-2007  . CYSTO/  RETROGRADE PYELOGRAM/  RIGHT URETERAL STENT PLACEMENT  09-15-2007  . CYSTOGRAM N/A 08/21/2015   Procedure: CYSTOGRAM;  Surgeon: Rana Snare, MD;  Location: [facility];  Service: Urology;  Laterality: N/A;  . CYSTOSCOPY WITH BIOPSY N/A 08/21/2015   Procedure: CYSTOSCOPY WITH BLADDER BIOPSY;  Surgeon: Rana Snare, MD;  Location: [facility];  Service: Urology;  Laterality: N/A;  . DILATION AND CURETTAGE OF UTERUS  1980's  . HOLMIUM LASER APPLICATION  [id number]   Procedure: HOLMIUM LASER APPLICATION;  Surgeon: Rana Snare, MD;  Location: [facility];  Service: Urology;;  . KNEE ARTHROSCOPY Bilateral right 05-29-2003/  left 2009  . LEFT HEART CATHETERIZATION WITH CORONARY ANGIOGRAM N/A 01/19/2014   Procedure: LEFT HEART CATHETERIZATION WITH CORONARY ANGIOGRAM;  Surgeon: [name], MD;  Location:  [facility] CATH [facility];  Service: Cardiovascular;  Laterality: N/A;  Mild to moderate CAD involving , mLAD and  D2 branch/  preserved LVF, ef 65%  . NEGATIVE SLEEP STUDY  PER PT  . PARTIAL MASTECTOMY WITH AXILLARY SENTINEL LYMPH NODE BIOPSY Left 08-05-2009  . TONSILLECTOMY  child  . TOTAL KNEE ARTHROPLASTY Right 03/25/2015   Procedure: RIGHT TOTAL KNEE ARTHROPLASTY;  Surgeon: Paralee Cancel, MD;  Location: [facility];  Service: Orthopedics;  Laterality: Right;  . TRANSTHORACIC ECHOCARDIOGRAM  01-10-2015   grade 1 diastolic dysfunction, ef A999333  moderate AR/  trivial MR and PR/  mild TR/  PA  systolic pressure increase (peak pressure 58mmHg S)  . TUBAL LIGATION  1980's    Current Medications: Outpatient Medications Prior to Visit  Medication Sig Dispense Refill   . amLODipine (NORVASC) 2.5 MG tablet TAKE 1 TABLET BY MOUTH ONCE DAILY 30 tablet 6  . celecoxib (CELEBREX) 200 MG capsule Take 200 mg by mouth daily as needed for mild pain.     . hydrocortisone 2.5 % lotion Apply 1 application topically daily as needed (skin irritation).     Marland Kitchen levothyroxine (SYNTHROID, LEVOTHROID) 75 MCG tablet Take 75 mcg by mouth daily before breakfast. PT HAS TO TAKE NAME BRAND    . magnesium oxide (MAG-OX) 400 MG tablet Take 400 mg by mouth daily. As needed    . nebivolol (BYSTOLIC) 2.5 MG tablet Take 2.5 mg by mouth daily.    Marland Kitchen venlafaxine XR (EFFEXOR-XR) 75 MG 24 hr capsule Take 75 mg by mouth at bedtime.   2  . warfarin (COUMADIN) 5 MG tablet TAKE AS DIRECTED BY COUMADIN CLINIC 30 tablet 3  . zolpidem (AMBIEN) 10 MG tablet Take 10 mg by mouth at bedtime as needed for sleep.     Marland Kitchen amLODipine (NORVASC) 2.5 MG tablet Take 2.5 mg by mouth daily.     No facility-administered medications prior to visit.      Allergies:   Ciprofloxacin hcl   Social History   Social History  . Marital status: Married    Spouse name: N/A  . Number of children: N/A  . Years of education: N/A   Social History Main Topics  . Smoking status: Never Smoker  . Smokeless tobacco: Never Used  . Alcohol use Yes     Comment: occasional  . Drug use: No  . Sexual activity: Not Asked   Other Topics Concern  . None   Social History Narrative   Lives in [place] with husband.  Retired.  Walks up to 4 days/wk - 15 min miles.     Family History:  The patient's family history includes Heart attack in her mother; Other in her brother and father.   ROS:   Please see the history of present illness.    ROS All other systems reviewed and are negative.  No flowsheet data found.     PHYSICAL EXAM:   VS:  BP 128/62   Pulse 61   Ht 5' 5.5" (1.664 m)   Wt 150 lb (68 kg)   BMI 24.58 kg/m    GEN: Well nourished, well developed, in no acute distress  HEENT: normal  Neck: no JVD, carotid  bruits, or masses Cardiac: RRR; no murmurs, rubs, or gallops,no edema.  Intact distal pulses bilaterally.  Respiratory:  clear to auscultation bilaterally, normal work of breathing GI: soft, nontender, nondistended, + BS MS: no deformity or atrophy  Skin: warm and dry, no rash Neuro:  Alert and Oriented  x 3, Strength and sensation are intact Psych: euthymic mood, full affect  Wt Readings from Last 3 Encounters:  06/08/16 150 lb (68 kg)  12/10/15 144 lb 6.4 oz (65.5 kg)  08/21/15 146 lb (66.2 kg)      Studies/Labs Reviewed:   EKG:  EKG is  ordered today.  The ekg ordered today demonstrates NSR with nonspecific ST abnormality  Recent Labs: 08/20/2015: BUN 17; Creatinine, Ser 0.82; Hemoglobin 13.1; Platelets 263; Potassium 3.6; Sodium 140   Lipid Panel    Component Value Date/Time   CHOL 198 01/19/2014 0347   TRIG 40 01/19/2014 0347   HDL 105 01/19/2014 0347   CHOLHDL 1.9 01/19/2014 0347   VLDL 8 01/19/2014 0347   LDLCALC 85 01/19/2014 0347    Additional studies/ records that were reviewed today include:  none    ASSESSMENT:    1. Persistent atrial fibrillation ([place])   2. Essential hypertension   3. Aortic valve insufficiency, etiology of cardiac valve disease unspecified      PLAN:  In order of problems listed above:  1. Persistent atrial fibrillation - maintaining NSR. Continue BB and warfarin.  2. HTN - BP controlled on current meds. Continue amlodipine/BB. 3. Moderate AR asymptomatic - repeat echo in 11/2015.      Medication Adjustments/Labs and Tests Ordered: Current medicines are reviewed at length with the patient today.  Concerns regarding medicines are outlined above.  Medication changes, Labs and Tests ordered today are listed in the Patient Instructions below.  There are no Patient Instructions on file for this visit.   Signed, Fransico Him, MD  06/08/2016 10:47 AM    [facility] [address], [place], [place]   [address] Phone: [phone]; Fax: [phone]

## 2016-06-08 NOTE — Patient Instructions (Signed)
Medication Instructions:  Your physician recommends that you continue on your current medications as directed. Please refer to the Current Medication list given to you today.   Labwork: None  Testing/Procedures: Your physician has requested that you have an echocardiogram in April 2018. Echocardiography is a painless test that uses sound waves to create images of your heart. It provides your doctor with information about the size and shape of your heart and how well your heart's chambers and valves are working. This procedure takes approximately one hour. There are no restrictions for this procedure.  Follow-Up: Your physician wants you to follow-up in: 1 year with Dr. Radford Pax. You will receive a reminder letter in the mail two months in advance. If you don't receive a letter, please call our office to schedule the follow-up appointment.   Any Other Special Instructions Will Be Listed Below (If Applicable).     If you need a refill on your cardiac medications before your next appointment, please call your pharmacy.

## 2016-06-12 ENCOUNTER — Other Ambulatory Visit: Payer: Self-pay | Admitting: *Deleted

## 2016-06-12 MED ORDER — WARFARIN SODIUM 5 MG PO TABS: 5.0000 mg | ORAL_TABLET | ORAL | 3 refills | Status: DC

## 2016-06-15 DIAGNOSIS — N210 Calculus in bladder: Secondary | ICD-10-CM | POA: Diagnosis not present

## 2016-06-15 DIAGNOSIS — N3020 Other chronic cystitis without hematuria: Secondary | ICD-10-CM | POA: Diagnosis not present

## 2016-06-16 ENCOUNTER — Other Ambulatory Visit: Payer: Self-pay

## 2016-06-16 MED ORDER — WARFARIN SODIUM 5 MG PO TABS: 5.0000 mg | ORAL_TABLET | ORAL | 1 refills | Status: AC

## 2016-06-25 ENCOUNTER — Ambulatory Visit (INDEPENDENT_AMBULATORY_CARE_PROVIDER_SITE_OTHER): Payer: Medicare Other

## 2016-06-25 DIAGNOSIS — D6851 Activated protein C resistance: Secondary | ICD-10-CM | POA: Diagnosis not present

## 2016-06-25 DIAGNOSIS — I481 Persistent atrial fibrillation: Secondary | ICD-10-CM

## 2016-06-25 DIAGNOSIS — I4819 Other persistent atrial fibrillation: Secondary | ICD-10-CM

## 2016-06-25 DIAGNOSIS — I480 Paroxysmal atrial fibrillation: Secondary | ICD-10-CM | POA: Diagnosis not present

## 2016-06-25 DIAGNOSIS — Z5181 Encounter for therapeutic drug level monitoring: Secondary | ICD-10-CM | POA: Diagnosis not present

## 2016-06-25 DIAGNOSIS — Z86711 Personal history of pulmonary embolism: Secondary | ICD-10-CM | POA: Diagnosis not present

## 2016-06-25 LAB — POCT INR: INR: 1.9

## 2016-07-23 ENCOUNTER — Ambulatory Visit (INDEPENDENT_AMBULATORY_CARE_PROVIDER_SITE_OTHER): Payer: Medicare Other | Admitting: *Deleted

## 2016-07-23 DIAGNOSIS — Z86711 Personal history of pulmonary embolism: Secondary | ICD-10-CM

## 2016-07-23 DIAGNOSIS — I480 Paroxysmal atrial fibrillation: Secondary | ICD-10-CM

## 2016-07-23 DIAGNOSIS — Z5181 Encounter for therapeutic drug level monitoring: Secondary | ICD-10-CM

## 2016-07-23 DIAGNOSIS — D6851 Activated protein C resistance: Secondary | ICD-10-CM

## 2016-07-23 DIAGNOSIS — I481 Persistent atrial fibrillation: Secondary | ICD-10-CM

## 2016-07-23 DIAGNOSIS — I4819 Other persistent atrial fibrillation: Secondary | ICD-10-CM

## 2016-07-23 LAB — POCT INR: INR: 2.4

## 2016-07-31 DIAGNOSIS — M25562 Pain in left knee: Secondary | ICD-10-CM | POA: Diagnosis not present

## 2016-07-31 DIAGNOSIS — G8929 Other chronic pain: Secondary | ICD-10-CM | POA: Diagnosis not present

## 2016-07-31 DIAGNOSIS — M1712 Unilateral primary osteoarthritis, left knee: Secondary | ICD-10-CM | POA: Diagnosis not present

## 2016-08-17 ENCOUNTER — Other Ambulatory Visit: Payer: Self-pay | Admitting: Gastroenterology

## 2016-08-17 DIAGNOSIS — K529 Noninfective gastroenteritis and colitis, unspecified: Secondary | ICD-10-CM | POA: Diagnosis not present

## 2016-08-17 DIAGNOSIS — R1310 Dysphagia, unspecified: Secondary | ICD-10-CM | POA: Diagnosis not present

## 2016-08-18 ENCOUNTER — Ambulatory Visit
Admission: RE | Admit: 2016-08-18 | Discharge: 2016-08-18 | Disposition: A | Discharge status: Home / Self Care | Payer: Medicare Other | Source: Ambulatory Visit | Attending: Gastroenterology | Admitting: Gastroenterology

## 2016-08-18 DIAGNOSIS — R1310 Dysphagia, unspecified: Secondary | ICD-10-CM | POA: Diagnosis not present

## 2016-08-18 DIAGNOSIS — K219 Gastro-esophageal reflux disease without esophagitis: Secondary | ICD-10-CM | POA: Diagnosis not present

## 2016-08-20 ENCOUNTER — Ambulatory Visit (INDEPENDENT_AMBULATORY_CARE_PROVIDER_SITE_OTHER): Payer: Medicare Other | Admitting: *Deleted

## 2016-08-20 DIAGNOSIS — I481 Persistent atrial fibrillation: Secondary | ICD-10-CM

## 2016-08-20 DIAGNOSIS — D6851 Activated protein C resistance: Secondary | ICD-10-CM | POA: Diagnosis not present

## 2016-08-20 DIAGNOSIS — Z86711 Personal history of pulmonary embolism: Secondary | ICD-10-CM

## 2016-08-20 DIAGNOSIS — I480 Paroxysmal atrial fibrillation: Secondary | ICD-10-CM

## 2016-08-20 DIAGNOSIS — Z5181 Encounter for therapeutic drug level monitoring: Secondary | ICD-10-CM | POA: Diagnosis not present

## 2016-08-20 DIAGNOSIS — I4819 Other persistent atrial fibrillation: Secondary | ICD-10-CM

## 2016-08-20 DIAGNOSIS — Z23 Encounter for immunization: Secondary | ICD-10-CM | POA: Diagnosis not present

## 2016-08-20 LAB — POCT INR: INR: 2.7

## 2016-09-17 ENCOUNTER — Ambulatory Visit (INDEPENDENT_AMBULATORY_CARE_PROVIDER_SITE_OTHER): Payer: Medicare Other | Admitting: *Deleted

## 2016-09-17 DIAGNOSIS — D6851 Activated protein C resistance: Secondary | ICD-10-CM

## 2016-09-17 DIAGNOSIS — I480 Paroxysmal atrial fibrillation: Secondary | ICD-10-CM | POA: Diagnosis not present

## 2016-09-17 DIAGNOSIS — Z86711 Personal history of pulmonary embolism: Secondary | ICD-10-CM

## 2016-09-17 DIAGNOSIS — Z5181 Encounter for therapeutic drug level monitoring: Secondary | ICD-10-CM

## 2016-09-17 DIAGNOSIS — I4819 Other persistent atrial fibrillation: Secondary | ICD-10-CM

## 2016-09-17 DIAGNOSIS — I481 Persistent atrial fibrillation: Secondary | ICD-10-CM

## 2016-09-17 LAB — POCT INR: INR: 2.5

## 2016-10-29 ENCOUNTER — Ambulatory Visit (INDEPENDENT_AMBULATORY_CARE_PROVIDER_SITE_OTHER): Payer: Medicare Other | Admitting: Pharmacist

## 2016-10-29 DIAGNOSIS — I480 Paroxysmal atrial fibrillation: Secondary | ICD-10-CM | POA: Diagnosis not present

## 2016-10-29 DIAGNOSIS — D6851 Activated protein C resistance: Secondary | ICD-10-CM

## 2016-10-29 DIAGNOSIS — Z86711 Personal history of pulmonary embolism: Secondary | ICD-10-CM

## 2016-10-29 DIAGNOSIS — Z5181 Encounter for therapeutic drug level monitoring: Secondary | ICD-10-CM

## 2016-10-29 DIAGNOSIS — I4819 Other persistent atrial fibrillation: Secondary | ICD-10-CM

## 2016-10-29 DIAGNOSIS — I481 Persistent atrial fibrillation: Secondary | ICD-10-CM

## 2016-10-29 LAB — POCT INR: INR: 2.6

## 2016-10-30 ENCOUNTER — Encounter (HOSPITAL_COMMUNITY): Payer: Self-pay | Admitting: *Deleted

## 2016-10-30 ENCOUNTER — Inpatient Hospital Stay (HOSPITAL_COMMUNITY)
Admission: EM | Admit: 2016-10-30 | Discharge: 2016-11-08 | DRG: 065 | Disposition: E | Payer: Medicare Other | Attending: Neurosurgery | Admitting: Neurosurgery

## 2016-10-30 ENCOUNTER — Emergency Department (HOSPITAL_COMMUNITY): Payer: Medicare Other

## 2016-10-30 DIAGNOSIS — Z4682 Encounter for fitting and adjustment of non-vascular catheter: Secondary | ICD-10-CM | POA: Diagnosis not present

## 2016-10-30 DIAGNOSIS — Z791 Long term (current) use of non-steroidal anti-inflammatories (NSAID): Secondary | ICD-10-CM | POA: Diagnosis not present

## 2016-10-30 DIAGNOSIS — R402312 Coma scale, best motor response, none, at arrival to emergency department: Secondary | ICD-10-CM | POA: Diagnosis present

## 2016-10-30 DIAGNOSIS — Z7901 Long term (current) use of anticoagulants: Secondary | ICD-10-CM

## 2016-10-30 DIAGNOSIS — I671 Cerebral aneurysm, nonruptured: Secondary | ICD-10-CM | POA: Diagnosis not present

## 2016-10-30 DIAGNOSIS — Z923 Personal history of irradiation: Secondary | ICD-10-CM | POA: Diagnosis not present

## 2016-10-30 DIAGNOSIS — I10 Essential (primary) hypertension: Secondary | ICD-10-CM | POA: Diagnosis present

## 2016-10-30 DIAGNOSIS — Z9841 Cataract extraction status, right eye: Secondary | ICD-10-CM

## 2016-10-30 DIAGNOSIS — R402212 Coma scale, best verbal response, none, at arrival to emergency department: Secondary | ICD-10-CM | POA: Diagnosis present

## 2016-10-30 DIAGNOSIS — D6851 Activated protein C resistance: Secondary | ICD-10-CM | POA: Diagnosis present

## 2016-10-30 DIAGNOSIS — R569 Unspecified convulsions: Secondary | ICD-10-CM | POA: Diagnosis present

## 2016-10-30 DIAGNOSIS — Z86711 Personal history of pulmonary embolism: Secondary | ICD-10-CM

## 2016-10-30 DIAGNOSIS — Z515 Encounter for palliative care: Secondary | ICD-10-CM | POA: Diagnosis not present

## 2016-10-30 DIAGNOSIS — I609 Nontraumatic subarachnoid hemorrhage, unspecified: Secondary | ICD-10-CM | POA: Diagnosis not present

## 2016-10-30 DIAGNOSIS — Z86718 Personal history of other venous thrombosis and embolism: Secondary | ICD-10-CM | POA: Diagnosis not present

## 2016-10-30 DIAGNOSIS — I351 Nonrheumatic aortic (valve) insufficiency: Secondary | ICD-10-CM | POA: Diagnosis present

## 2016-10-30 DIAGNOSIS — Z66 Do not resuscitate: Secondary | ICD-10-CM | POA: Diagnosis present

## 2016-10-30 DIAGNOSIS — I160 Hypertensive urgency: Secondary | ICD-10-CM | POA: Diagnosis not present

## 2016-10-30 DIAGNOSIS — I608 Other nontraumatic subarachnoid hemorrhage: Secondary | ICD-10-CM | POA: Diagnosis not present

## 2016-10-30 DIAGNOSIS — Z881 Allergy status to other antibiotic agents status: Secondary | ICD-10-CM | POA: Diagnosis not present

## 2016-10-30 DIAGNOSIS — R29810 Facial weakness: Secondary | ICD-10-CM | POA: Diagnosis present

## 2016-10-30 DIAGNOSIS — I619 Nontraumatic intracerebral hemorrhage, unspecified: Secondary | ICD-10-CM | POA: Diagnosis not present

## 2016-10-30 DIAGNOSIS — Z8249 Family history of ischemic heart disease and other diseases of the circulatory system: Secondary | ICD-10-CM | POA: Diagnosis not present

## 2016-10-30 DIAGNOSIS — R29724 NIHSS score 24: Secondary | ICD-10-CM | POA: Diagnosis present

## 2016-10-30 DIAGNOSIS — Z853 Personal history of malignant neoplasm of breast: Secondary | ICD-10-CM | POA: Diagnosis not present

## 2016-10-30 DIAGNOSIS — Z961 Presence of intraocular lens: Secondary | ICD-10-CM | POA: Diagnosis present

## 2016-10-30 DIAGNOSIS — Z9842 Cataract extraction status, left eye: Secondary | ICD-10-CM

## 2016-10-30 DIAGNOSIS — Z9012 Acquired absence of left breast and nipple: Secondary | ICD-10-CM

## 2016-10-30 DIAGNOSIS — I629 Nontraumatic intracranial hemorrhage, unspecified: Secondary | ICD-10-CM

## 2016-10-30 DIAGNOSIS — I618 Other nontraumatic intracerebral hemorrhage: Secondary | ICD-10-CM | POA: Diagnosis present

## 2016-10-30 DIAGNOSIS — I610 Nontraumatic intracerebral hemorrhage in hemisphere, subcortical: Secondary | ICD-10-CM | POA: Diagnosis not present

## 2016-10-30 DIAGNOSIS — I482 Chronic atrial fibrillation: Secondary | ICD-10-CM | POA: Diagnosis not present

## 2016-10-30 DIAGNOSIS — I6012 Nontraumatic subarachnoid hemorrhage from left middle cerebral artery: Secondary | ICD-10-CM | POA: Diagnosis not present

## 2016-10-30 DIAGNOSIS — R402112 Coma scale, eyes open, never, at arrival to emergency department: Secondary | ICD-10-CM | POA: Diagnosis present

## 2016-10-30 DIAGNOSIS — R51 Headache: Secondary | ICD-10-CM | POA: Diagnosis not present

## 2016-10-30 LAB — URINALYSIS, ROUTINE W REFLEX MICROSCOPIC
Bacteria, UA: NONE SEEN
Bilirubin Urine: NEGATIVE
GLUCOSE, UA: 50 mg/dL — AB
KETONES UR: NEGATIVE mg/dL
LEUKOCYTES UA: NEGATIVE
NITRITE: NEGATIVE
PH: 8 (ref 5.0–8.0)
Protein, ur: NEGATIVE mg/dL
SPECIFIC GRAVITY, URINE: 1.013 (ref 1.005–1.030)
SQUAMOUS EPITHELIAL / LPF: NONE SEEN

## 2016-10-30 LAB — I-STAT ARTERIAL BLOOD GAS, ED
ACID-BASE EXCESS: 1 mmol/L (ref 0.0–2.0)
Bicarbonate: 27.5 mmol/L (ref 20.0–28.0)
O2 Saturation: 100 %
PH ART: 7.371 (ref 7.350–7.450)
PO2 ART: 461 mmHg — AB (ref 83.0–108.0)
TCO2: 29 mmol/L (ref 0–100)
pCO2 arterial: 47 mmHg (ref 32.0–48.0)

## 2016-10-30 LAB — CBC
HCT: 41.9 % (ref 36.0–46.0)
HEMOGLOBIN: 14.2 g/dL (ref 12.0–15.0)
MCH: 30.3 pg (ref 26.0–34.0)
MCHC: 33.9 g/dL (ref 30.0–36.0)
MCV: 89.3 fL (ref 78.0–100.0)
Platelets: 250 10*3/uL (ref 150–400)
RBC: 4.69 MIL/uL (ref 3.87–5.11)
RDW: 13.4 % (ref 11.5–15.5)
WBC: 8.1 10*3/uL (ref 4.0–10.5)

## 2016-10-30 LAB — DIFFERENTIAL
Basophils Absolute: 0 10*3/uL (ref 0.0–0.1)
Basophils Relative: 0 %
Eosinophils Absolute: 0.1 10*3/uL (ref 0.0–0.7)
Eosinophils Relative: 2 %
LYMPHS ABS: 4.4 10*3/uL — AB (ref 0.7–4.0)
LYMPHS PCT: 54 %
Monocytes Absolute: 0.7 10*3/uL (ref 0.1–1.0)
Monocytes Relative: 9 %
NEUTROS ABS: 2.8 10*3/uL (ref 1.7–7.7)
NEUTROS PCT: 35 %

## 2016-10-30 LAB — RAPID URINE DRUG SCREEN, HOSP PERFORMED
Amphetamines: NOT DETECTED
BARBITURATES: NOT DETECTED
Benzodiazepines: NOT DETECTED
Cocaine: NOT DETECTED
Opiates: NOT DETECTED
TETRAHYDROCANNABINOL: NOT DETECTED

## 2016-10-30 LAB — COMPREHENSIVE METABOLIC PANEL
ALBUMIN: 4.6 g/dL (ref 3.5–5.0)
ALT: 16 U/L (ref 14–54)
AST: 32 U/L (ref 15–41)
Alkaline Phosphatase: 92 U/L (ref 38–126)
Anion gap: 14 (ref 5–15)
BILIRUBIN TOTAL: 0.8 mg/dL (ref 0.3–1.2)
BUN: 17 mg/dL (ref 6–20)
CO2: 22 mmol/L (ref 22–32)
Calcium: 9.6 mg/dL (ref 8.9–10.3)
Chloride: 104 mmol/L (ref 101–111)
Creatinine, Ser: 0.78 mg/dL (ref 0.44–1.00)
GFR calc Af Amer: >60 mL/min (ref >60)
GFR calc non Af Amer: >60 mL/min (ref >60)
GLUCOSE: 113 mg/dL — AB (ref 65–99)
Potassium: 3.5 mmol/L (ref 3.5–5.1)
Sodium: 140 mmol/L (ref 135–145)
TOTAL PROTEIN: 7.2 g/dL (ref 6.5–8.1)

## 2016-10-30 LAB — I-STAT CHEM 8, ED
BUN: 20 mg/dL (ref 6–20)
CREATININE: 0.7 mg/dL (ref 0.44–1.00)
Calcium, Ion: 1.05 mmol/L — ABNORMAL LOW (ref 1.15–1.40)
Chloride: 105 mmol/L (ref 101–111)
Glucose, Bld: 114 mg/dL — ABNORMAL HIGH (ref 65–99)
HEMATOCRIT: 42 % (ref 36.0–46.0)
Hemoglobin: 14.3 g/dL (ref 12.0–15.0)
Potassium: 3.2 mmol/L — ABNORMAL LOW (ref 3.5–5.1)
Sodium: 142 mmol/L (ref 135–145)
TCO2: 25 mmol/L (ref 0–100)

## 2016-10-30 LAB — I-STAT TROPONIN, ED: Troponin i, poc: 0 ng/mL (ref 0.00–0.08)

## 2016-10-30 LAB — APTT: APTT: 33 s (ref 24–36)

## 2016-10-30 LAB — PROTIME-INR
INR: 2.09
Prothrombin Time: 23.8 seconds — ABNORMAL HIGH (ref 11.4–15.2)

## 2016-10-30 LAB — ETHANOL: Alcohol, Ethyl (B): <5 mg/dL (ref <5)

## 2016-10-30 LAB — CBG MONITORING, ED: Glucose-Capillary: 104 mg/dL — ABNORMAL HIGH (ref 65–99)

## 2016-10-30 MED ORDER — VITAMIN K1 10 MG/ML IJ SOLN
10.0000 mg | INTRAVENOUS | Status: AC
  Administered 2016-10-30: 10 mg via INTRAVENOUS
  Filled 2016-10-30: qty 1

## 2016-10-30 MED ORDER — LEVETIRACETAM 500 MG/5ML IV SOLN
1000.0000 mg | Freq: Once | INTRAVENOUS | Status: AC
Start: 2016-10-30 — End: 2016-10-30
  Administered 2016-10-30: 1000 mg via INTRAVENOUS
  Filled 2016-10-30 (×2): qty 10

## 2016-10-30 MED ORDER — SUCCINYLCHOLINE CHLORIDE 20 MG/ML IJ SOLN
INTRAMUSCULAR | Status: AC | PRN
  Administered 2016-10-30: 80 mg via INTRAVENOUS

## 2016-10-30 MED ORDER — ONDANSETRON HCL 4 MG/2ML IJ SOLN: 4.0000 mg | Freq: Four times a day (QID) | INTRAMUSCULAR | Status: DC | PRN

## 2016-10-30 MED ORDER — PROPOFOL 1000 MG/100ML IV EMUL
INTRAVENOUS | Status: AC
  Filled 2016-10-30: qty 100

## 2016-10-30 MED ORDER — PROPOFOL 1000 MG/100ML IV EMUL
5.0000 ug/kg/min | Freq: Once | INTRAVENOUS | Status: AC
  Administered 2016-10-30: 20 ug/kg/min via INTRAVENOUS

## 2016-10-30 MED ORDER — SODIUM CHLORIDE 0.9 % IV SOLN: 250.0000 mL | INTRAVENOUS | Status: DC | PRN

## 2016-10-30 MED ORDER — ETOMIDATE 2 MG/ML IV SOLN
INTRAVENOUS | Status: AC | PRN
  Administered 2016-10-30: 20 mg via INTRAVENOUS

## 2016-10-30 MED ORDER — IOPAMIDOL (ISOVUE-370) INJECTION 76%
INTRAVENOUS | Status: AC
  Filled 2016-10-30: qty 50

## 2016-10-30 MED ORDER — MORPHINE SULFATE (PF) 4 MG/ML IV SOLN: 4.0000 mg | INTRAVENOUS | Status: DC | PRN

## 2016-10-30 MED ORDER — EMPTY CONTAINERS FLEXIBLE MISC
2000.0000 [IU] | Status: AC
  Administered 2016-10-30: 2000 [IU] via INTRAVENOUS
  Filled 2016-10-30: qty 80

## 2016-10-30 NOTE — ED Notes (Signed)
Airway stable and patient taken to CT

## 2016-10-30 NOTE — ED Notes (Signed)
Patient intubated by MD Belfi after vomiting and unprotected airway.

## 2016-10-30 NOTE — H&P (Signed)
Subjective: The patient is a 76 year old right-handed white female with a history of atrial fibrillation and Coumadin anticoagulation who was review state of good health this evening. By report the patient was at her grandchild soccer game and had the sudden onset of headache. She became unresponsive and was brought to the ER by her husband. The patient became progressively more obtunded and was intubated. A head CT demonstrated a large left intracerebral hemorrhage with subarachnoid hemorrhage. She was worked up further with a CTA which demonstrated a left MCA aneurysm. The patient was evaluated by Dr. Tamera Punt and a neurosurgical consultation was requested. She was given Wandalee Ferdinand to reverse her anticoagulation  Past Medical History:  Diagnosis Date  . Anticoagulated on Coumadin   . Aortic valve insufficiency 05/27/2015   Moderate by echo  . Arthritis    knees  . Bursitis of hip   . Delayed surgical wound healing    03-25-2015  S/P   RIGHT TOTAL KNEE--  DID HAVE WOUND-VAC NOW DOWN TO JUST LARGE BAND-AID  . Depression   . Factor 5 Leiden mutation, heterozygous ([place])   . Family history of adverse reaction to anesthesia    mother developed irreg heart rate during surgery  . Frequency of urination   . GERD (gastroesophageal reflux disease)   . Gross hematuria   . H/O hiatal hernia   . History of benign bladder tumor   . 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 History of DVT & PE ~ 2000   S/P RLE knee scope  . History of kidney stones   . Hypertension   . Hypothyroidism   . Nephrolithiasis    non-obstructive  . Pelvic mass   . Permanent atrial fibrillation ([place])    a. 05/2011 Echo: EF 70%, triv MR, mild AI, mod TR, Gr 1 DD.  Marland Kitchen Sleep deprivation   . Urgency of urination     Past Surgical History:  Procedure Laterality Date  . ABDOMINAL HYSTERECTOMY  1983  . BREAST BIOPSY Right 2010  .  CARDIOVASCULAR STRESS TEST  12-11-2013   dr Tressia Miners turner   normal nuclear study/  no ischemia, normal LV function and wall function, ef 65%  . CATARACT EXTRACTION W/ INTRAOCULAR LENS  IMPLANT, BILATERAL  2012  . CYSTO/  BILATERAL RETROGRADE PYELOGRAM/  BLADDER BX AND FULGERATION  01-07-2007  . CYSTO/  RETROGRADE PYELOGRAM/  RIGHT URETERAL STENT PLACEMENT  09-15-2007  . CYSTOGRAM N/A 08/21/2015   Procedure: CYSTOGRAM;  Surgeon: Rana Snare, MD;  Location[facility];  Service: Urology;  Laterality: N/A;  . CYSTOSCOPY WITH BIOPSY N/A 08/21/2015   Procedure: CYSTOSCOPY WITH BLADDER BIOPSY;  Surgeon: Rana Snare, MD;  Location: [facility];  Service: Urology;  Laterality: N/A;  . DILATION AND CURETTAGE OF UTERUS  1980's  . HOLMIUM LASER APPLICATION  9/62/2297   Procedure: HOLMIUM LASER APPLICATION;  Surgeon: Rana Snare, MD;  Location: [facility];  Service: Urology;;  . KNEE ARTHROSCOPY Bilateral right 05-29-2003/  left 2009  . LEFT HEART CATHETERIZATION WITH CORONARY ANGIOGRAM N/A 01/19/2014   Procedure: LEFT HEART CATHETERIZATION WITH CORONARY ANGIOGRAM;  Surgeon: [name], MD;  Location: [facility] CATH LAB;  Service: Cardiovascular;  Laterality: N/A;  Mild to moderate CAD involving , mLAD and  D2 branch/  preserved LVF, ef 65%  . NEGATIVE  SLEEP STUDY  PER PT  . PARTIAL MASTECTOMY WITH AXILLARY SENTINEL LYMPH NODE BIOPSY Left 08-05-2009  . TONSILLECTOMY  child  . TOTAL KNEE ARTHROPLASTY Right 03/25/2015   Procedure: RIGHT TOTAL KNEE ARTHROPLASTY;  Surgeon: Paralee Cancel, MD;  Location: [facility];  Service: Orthopedics;  Laterality: Right;  . TRANSTHORACIC ECHOCARDIOGRAM  01-10-2015   grade 1 diastolic dysfunction, ef 25-05%/  moderate AR/  trivial MR and PR/  mild TR/  PA  systolic pressure increase (peak pressure 56mmHg S)  . TUBAL LIGATION  1980's    Allergies  Allergen Reactions  . Ciprofloxacin Hcl Swelling    .throat swelled    Social History   Substance Use Topics  . Smoking status: Never Smoker  . Smokeless tobacco: Never Used  . Alcohol use Yes     Comment: occasional    Family History  Problem Relation Age of Onset  . Heart attack Mother     "small heart attack" at 61, died @ 30.  . Other Father     died @ 76 following knee surgery  . Other Brother     alive and well.   Prior to Admission medications   Medication Sig Start Date End Date Taking? Authorizing Provider  amLODipine (NORVASC) 2.5 MG tablet TAKE 1 TABLET BY MOUTH ONCE DAILY 05/29/16   Sueanne Margarita, MD  celecoxib (CELEBREX) 200 MG capsule Take 200 mg by mouth daily as needed for mild pain.     Historical Provider, MD  hydrocortisone 2.5 % lotion Apply 1 application topically daily as needed (skin irritation).  05/30/15   Historical Provider, MD  levothyroxine (SYNTHROID, LEVOTHROID) 75 MCG tablet Take 75 mcg by mouth daily before breakfast. PT HAS TO TAKE NAME BRAND    Historical Provider, MD  magnesium oxide (MAG-OX) 400 MG tablet Take 400 mg by mouth daily. As needed    Historical Provider, MD  nebivolol (BYSTOLIC) 2.5 MG tablet Take 2.5 mg by mouth daily.    Historical Provider, MD  venlafaxine XR (EFFEXOR-XR) 75 MG 24 hr capsule Take 75 mg by mouth at bedtime.  05/16/15   Historical Provider, MD  warfarin (COUMADIN) 5 MG tablet Take 1 tablet (5 mg total) by mouth as directed. 06/16/16   Sueanne Margarita, MD  zolpidem (AMBIEN) 10 MG tablet Take 10 mg by mouth at bedtime as needed for sleep.  02/05/14   Historical Provider, MD     Review of Systems  Positive ROS: Unobtainable  All other systems have been reviewed and were otherwise negative with the exception of those mentioned in the HPI and as above.  Objective: Vital signs in last 24 hours: Pulse Rate:  [56-73] 57 (03/23 1959) Resp:  [13-24] 13 (03/23 1959) BP: (177-264)/(78-149) 177/78 (03/23 1945) SpO2:  [99 %-100 %] 100 % (03/23 1959) FiO2 (%):  [100 %] 100 % (03/23 1959) Weight:  [72.5 kg (159  lb 13.3 oz)] 72.5 kg (159 lb 13.3 oz) (03/23 1914)  General Appearance: A comatose intubated 76 year old white female in no apparent distress. HEENT: Normocephalic, atraumatic, her pupils are 6 mm and irregular on the left, 4 mm and regular on the right neither react.  Thorax: Symmetric  Abdomen: Soft  Neurologic exam the patient is Glasgow Coma Scale 4 intubated, [id number]. Her pupils are as above. She has no corneal reflexes. There is no response to tracheal stimulation. She is not breathing over the ventilator. She decerebrate postures bilaterally.  I have reviewed the patient's head CT performed at [facility] today which demonstrates a large left intracerebral hemorrhage with some left to right midline shift. There is quite a bit of subarachnoid hemorrhage around the circle of Willis. She has a mass in the region of the left MCA bifurcation.  By report the patient's CT angiogram demonstrates a left MCA aneurysm.   Data Review Lab Results  Component Value Date   WBC 8.1 10/28/2016   HGB 14.3 10/12/2016   HCT 42.0 11/03/2016   MCV 89.3 10/14/2016   PLT 250 10/15/2016   Lab Results  Component Value Date   NA 142 10/18/2016   K 3.2 (L) 10/21/2016   CL 105 10/11/2016   CO2 22 10/09/2016   BUN 20 10/08/2016   CREATININE 0.70 10/16/2016   GLUCOSE 114 (H) 10/10/2016   Lab Results  Component Value Date   INR 2.09 11/07/2016    Assessment/Plan: Grade 5 subarachnoid hemorrhage, intracerebral hemorrhage: I have discussed the situation extensively with the patient's husband son and daughter. Unfortunately she has had a terminal devastating bleed. The chance for meaningful survival is essentially 0. They have related that the patient would not want to be kept alive under the circumstances. The plan is to admit her for supportive care until the family gathers and will likely withdraw support in the near future. I have answered all her questions.  Tayva Easterday D 10/30/2016  9:00 PM

## 2016-10-30 NOTE — ED Notes (Signed)
Code Stroke activated @ [phone]

## 2016-10-30 NOTE — ED Notes (Signed)
Critical Care at bedside.  

## 2016-10-30 NOTE — Consult Note (Signed)
PULMONARY / CRITICAL CARE MEDICINE   Name: Jamie Coffey MRN: [id number] DOB: 20-Apr-1941    ADMISSION DATE:  10/10/2016 CONSULTATION DATE:  @TODAY @   REFERRING MD:    CHIEF COMPLAINT:  Headache  HISTORY OF PRESENT ILLNESS:   Jamie Coffey is a 76 year old female with a history of Afib on AC, HTn, moderate AI, breast cancer, who presents today after headache suddernly began at her grand daughters soccer game. She was well until headache. She had lunch with her friends prior to the soccer game.   The patient's spouse rushed her tot he ED where she began to decompensate. After intubation, STAT imaging revealed Left IPH, SAH, from liekly Left MCA aneurysmal bleed.   The patient's family was alerted to the nature of her events. They frequently discuss end of life issues. She was very clear with her husband and children about her wishes. She does not wish to be maintained in this state without a quality of life. She would define QOL as maintenance of ADLs.   CCM consulted to facilitate transfer to ICU, where palliative plan can be continued. The family wishes to pursue comfort care but are not ready to extubate in ED. They wish others to arrive.  Patient appears comfortable on propofol drip without agonal breathing or furrowed brow. Family agrees. Chaplain and personal pastor present at bedside and agree with plan to move to ICU to continue plans and pursue palliative extubation at the wishes of the family. They simply need more time.  PAST MEDICAL HISTORY :  She  has a past medical history of Anticoagulated on Coumadin; Aortic valve insufficiency (05/27/2015); Arthritis; Bursitis of hip; Delayed surgical wound healing; Depression; Factor 5 Leiden mutation, heterozygous ([place]); Family history of adverse reaction to anesthesia; Frequency of urination; GERD (gastroesophageal reflux disease); Gross hematuria; H/O hiatal hernia; History of benign bladder tumor; History of breast cancer (NO RECURRENCE   PER PT); History of DVT & PE (~ 2000); History of kidney stones; Hypertension; Hypothyroidism; Nephrolithiasis; Pelvic mass; Permanent atrial fibrillation ([place]); Sleep deprivation; and Urgency of urination.  PAST SURGICAL HISTORY: She  has a past surgical history that includes Knee arthroscopy (Bilateral, right 05-29-2003/  left 2009); Dilation and curettage of uterus (1980's); Tubal ligation (1980's); left heart catheterization with coronary angiogram (N/A, 01/19/2014); Total knee arthroplasty (Right, 03/25/2015); Breast biopsy (Right, 2010); Partial mastectomy with axillary sentinel lymph node biopsy (Left, 08-05-2009); CYSTO/  BILATERAL RETROGRADE PYELOGRAM/  BLADDER BX AND FULGERATION (01-07-2007); CYSTO/  RETROGRADE PYELOGRAM/  RIGHT URETERAL STENT PLACEMENT (09-15-2007); Cardiovascular stress test (12-11-2013   dr Tressia Miners turner); transthoracic echocardiogram (01-10-2015); Cataract extraction w/ intraocular lens  implant, bilateral (2012); Tonsillectomy (child); Abdominal hysterectomy (1983); NEGATIVE SLEEP STUDY (PER PT); Cystoscopy with biopsy (N/A, 08/21/2015); Cystogram (N/A, 08/21/2015); and Holmium laser application (9/47/0962).  Allergies  Allergen Reactions  . Ciprofloxacin Hcl Swelling    .throat swelled    No current facility-administered medications on file prior to encounter.    Current Outpatient Prescriptions on File Prior to Encounter  Medication Sig  . amLODipine (NORVASC) 2.5 MG tablet TAKE 1 TABLET BY MOUTH ONCE DAILY  . celecoxib (CELEBREX) 200 MG capsule Take 200 mg by mouth daily as needed for mild pain.   . hydrocortisone 2.5 % lotion Apply 1 application topically daily as needed (skin irritation).   Marland Kitchen levothyroxine (SYNTHROID, LEVOTHROID) 75 MCG tablet Take 75 mcg by mouth daily before breakfast. PT HAS TO TAKE NAME BRAND  . magnesium oxide (MAG-OX) 400 MG tablet Take 400  mg by mouth daily. As needed  . nebivolol (BYSTOLIC) 2.5 MG tablet Take 2.5 mg by mouth daily.  Marland Kitchen  venlafaxine XR (EFFEXOR-XR) 75 MG 24 hr capsule Take 75 mg by mouth at bedtime.   Marland Kitchen warfarin (COUMADIN) 5 MG tablet Take 1 tablet (5 mg total) by mouth as directed.  . zolpidem (AMBIEN) 10 MG tablet Take 10 mg by mouth at bedtime as needed for sleep.     FAMILY HISTORY:  Her indicated that her mother is deceased. She indicated that her father is deceased. She indicated that the status of her brother is unknown. She indicated that her maternal grandmother is deceased. She indicated that her maternal grandfather is deceased. She indicated that her paternal grandmother is deceased. She indicated that her paternal grandfather is deceased.    SOCIAL HISTORY: She  reports that she has never smoked. She has never used smokeless tobacco. She reports that she drinks alcohol. She reports that she does not use drugs.  REVIEW OF SYSTEMS:     VITAL SIGNS: BP (!) 177/78   Pulse (!) 57   Resp 13   Wt 159 lb 13.3 oz (72.5 kg)   SpO2 100%   BMI 26.19 kg/m   HEMODYNAMICS:    VENTILATOR SETTINGS: Vent Mode: PRVC FiO2 (%):  [100 %] 100 % Set Rate:  [15 bmp] 15 bmp Vt Set:  [470 mL] 470 mL PEEP:  [5 cmH20] 5 cmH20 Plateau Pressure:  [12 cmH20] 12 cmH20  INTAKE / OUTPUT: No intake/output data recorded.  PHYSICAL EXAMINATION: Physical Exam: Pulse Rate:  [45-75] 45 (03/23 2145) Resp:  [13-24] 15 (03/23 2145) BP: (160-264)/(59-149) 160/59 (03/23 2145) SpO2:  [99 %-100 %] 100 % (03/23 2145) FiO2 (%):  [100 %] 100 % (03/23 1959) Weight:  [159 lb 13.3 oz (72.5 kg)] 159 lb 13.3 oz (72.5 kg) (03/23 1914)  General  no apparent distress  HEENT No gross abnormalities. ETT present  Pulmonary Clear to auscultation bilaterally with no wheezes, rales or ronchi.    Cardiovascular Normal rate, regular rhythm. S1, s2. No m/r/g.  Abdomen Soft, non-tender, non-distended, positive bowel sounds,  Neurologic Intubated, sedated. Pupils dilated. No response to verbal  Skin/Integuement Right arm petechia      LABS:  BMET  Recent Labs Lab 10/19/2016 1909 10/19/2016 1912  NA 140 142  K 3.5 3.2*  CL 104 105  CO2 22  --   BUN 17 20  CREATININE 0.78 0.70  GLUCOSE 113* 114*    Electrolytes  Recent Labs Lab 10/25/2016 1909  CALCIUM 9.6    CBC  Recent Labs Lab 10/24/2016 1909 10/27/2016 1912  WBC 8.1  --   HGB 14.2 14.3  HCT 41.9 42.0  PLT 250  --     Coag's  Recent Labs Lab 10/29/16 1108 10/11/2016 1909  APTT  --  33  INR 2.6 2.09    Sepsis Markers No results for input(s): LATICACIDVEN, PROCALCITON, O2SATVEN in the last 168 hours.  ABG  Recent Labs Lab 11/05/2016 2021  PHART 7.371  PCO2ART 47.0  PO2ART 461.0*    Liver Enzymes  Recent Labs Lab 11/04/2016 1909  AST 32  ALT 16  ALKPHOS 92  BILITOT 0.8  ALBUMIN 4.6    Cardiac Enzymes No results for input(s): TROPONINI, PROBNP in the last 168 hours.  Glucose  Recent Labs Lab 10/10/2016 1904  GLUCAP 104*    Imaging Ct Angio Head W Or Wo Contrast  Result Date: 11/01/2016 CLINICAL DATA:  76 year old female with  large volume of left hemisphere intra-axial as well as basilar cistern and left convexity subarachnoid hemorrhage. Suspicious for left side aneurysmal rupture. Initial encounter. EXAM: CT ANGIOGRAPHY HEAD AND NECK TECHNIQUE: Multidetector CT imaging of the head and neck was performed using the standard protocol during bolus administration of intravenous contrast. Multiplanar CT image reconstructions and MIPs were obtained to evaluate the vascular anatomy. Carotid stenosis measurements (when applicable) are obtained utilizing NASCET criteria, using the distal internal carotid diameter as the denominator. CONTRAST:  50 mL Isovue 370 COMPARISON:  Head CT without contrast 1921 hours today. FINDINGS: CTA NECK Skeleton: Mild motion artifact in the lower neck. No acute osseous abnormality identified. Visualized paranasal sinuses and mastoids are stable and well pneumatized. Upper chest: Negative lung apices.  Negative visualized superior mediastinum. Endotracheal tube in position, tip terminates just below the aortic arch. Oral enteric tube loops in the nasopharynx (series 401, image 94) but then continues into the thoracic esophagus. Other neck: Small volume of fluid in the pharynx in the setting of intubation. Looped enteric tube at the nasopharynx. Negative parapharyngeal and retropharyngeal spaces. Mild mass effect on the sublingual space. Negative submandibular and parotid glands. Negative thyroid. No cervical lymphadenopathy. Aortic arch: 3 vessel arch configuration. Mild distal arch calcified atherosclerosis. Right carotid system: Negative. Left carotid system: Mildly tortuous proximal left CCA. Negative left carotid bifurcation. Negative cervical left ICA. Vertebral arteries:No proximal subclavian artery stenosis. Normal vertebral artery origins. Tortuous bilateral V1 segments. The right vertebral artery is mildly dominant. Both vertebral arteries are patent to the skullbase without stenosis. CTA HEAD Posterior circulation: Mildly decreased caliber and mild irregularity of the vertebral artery V4 segments which might reflect a degree of vasospasm. Both PICA origins and vertebrobasilar junction remain patent. Patent basilar artery without stenosis. Irregularity at both PCA origins and proximal PCAs favored to reflect vasospasm. Posterior communicating arteries are diminutive or absent. The P2 segments have a more normal appearance. Bilateral PCA branches are within normal limits. Anterior circulation: Both ICA siphons are patent. The right ICA terminus is better enhancing, and on the left vasospasm is suspected in the supraclinoid segment. There is calcified siphon atherosclerosis which is up to moderate on the left and mild on the right. Both carotid termini are patent. The left ACA A1 segment is diminutive or absent, and is probably non dominant rather than severely spasms given the mildly dominant appearance of  the right siphon. The right ICA terminus, right ACA and MCA origins are normal. The anterior communicating artery is patent. Bilateral ACA branches are patent but irregular compatible with vasospasm. The right MCA M1 segment is mildly irregular. Right MCA branches are patent but irregular compatible with spasm. The left MCA origin and M1 segment are patent. At the left MCA bifurcation or trifurcation there is a saccular 3-4 mm lesion (series 402, image 233, series 407, image 46), series 405, image 15, and series 406, image 32). This is directed superiorly. There is poor enhancement of the left MCA M2 branches which may to reflect a combination of vasospasm and thrombosis. No other intracranial aneurysm identified. Venous sinuses: Not enhancing on this phase of imaging. Anatomic variants: Probable dominant right ACA A1 segment and diminutive or absent left a 1. Other findings: Large volume left frontal and temporal lobe hemorrhage with subarachnoid extension re - demonstrated. Severe mass effect in the left hemisphere with rightward midline shift which appears increased now up to 14 mm (9 mm previously). Review of the MIP images confirms the above findings IMPRESSION: 1.  Positive for a left MCA bifurcation aneurysm directed superiorly. I suspect the aneurysm is partially thrombosed, and the enhancing portion is 3-4 mm diameter. Preliminary report of this finding discussed by telephone with Dr. Wallie Char at 2010 hours on 10/26/2016. 2. Large volume intracranial hemorrhage with interval increase midline rightward midline shift now up to 14 mm. This was relayed via text pager to Dr. Nicole Kindred on 10/10/2016 at 20:18. 3. Intracranial artery vasospasm. Poor enhancement of left MCA branches probably reflects a combination of vasospasm and M2 branch thrombosis. 4. Negative cervical carotid and vertebral arteries. Mild to moderate left ICA siphon calcified plaque without significant stenosis. 5. Oral enteric tube looped in  the nasopharynx. Recommend repositioning when feasible. Electronically Signed   By: Genevie Ann M.D.   On: 10/09/2016 20:21   Ct Angio Neck W And/or Wo Contrast  Result Date: 11/05/2016 CLINICAL DATA:  76 year old female with large volume of left hemisphere intra-axial as well as basilar cistern and left convexity subarachnoid hemorrhage. Suspicious for left side aneurysmal rupture. Initial encounter. EXAM: CT ANGIOGRAPHY HEAD AND NECK TECHNIQUE: Multidetector CT imaging of the head and neck was performed using the standard protocol during bolus administration of intravenous contrast. Multiplanar CT image reconstructions and MIPs were obtained to evaluate the vascular anatomy. Carotid stenosis measurements (when applicable) are obtained utilizing NASCET criteria, using the distal internal carotid diameter as the denominator. CONTRAST:  50 mL Isovue 370 COMPARISON:  Head CT without contrast 1921 hours today. FINDINGS: CTA NECK Skeleton: Mild motion artifact in the lower neck. No acute osseous abnormality identified. Visualized paranasal sinuses and mastoids are stable and well pneumatized. Upper chest: Negative lung apices. Negative visualized superior mediastinum. Endotracheal tube in position, tip terminates just below the aortic arch. Oral enteric tube loops in the nasopharynx (series 401, image 94) but then continues into the thoracic esophagus. Other neck: Small volume of fluid in the pharynx in the setting of intubation. Looped enteric tube at the nasopharynx. Negative parapharyngeal and retropharyngeal spaces. Mild mass effect on the sublingual space. Negative submandibular and parotid glands. Negative thyroid. No cervical lymphadenopathy. Aortic arch: 3 vessel arch configuration. Mild distal arch calcified atherosclerosis. Right carotid system: Negative. Left carotid system: Mildly tortuous proximal left CCA. Negative left carotid bifurcation. Negative cervical left ICA. Vertebral arteries:No proximal  subclavian artery stenosis. Normal vertebral artery origins. Tortuous bilateral V1 segments. The right vertebral artery is mildly dominant. Both vertebral arteries are patent to the skullbase without stenosis. CTA HEAD Posterior circulation: Mildly decreased caliber and mild irregularity of the vertebral artery V4 segments which might reflect a degree of vasospasm. Both PICA origins and vertebrobasilar junction remain patent. Patent basilar artery without stenosis. Irregularity at both PCA origins and proximal PCAs favored to reflect vasospasm. Posterior communicating arteries are diminutive or absent. The P2 segments have a more normal appearance. Bilateral PCA branches are within normal limits. Anterior circulation: Both ICA siphons are patent. The right ICA terminus is better enhancing, and on the left vasospasm is suspected in the supraclinoid segment. There is calcified siphon atherosclerosis which is up to moderate on the left and mild on the right. Both carotid termini are patent. The left ACA A1 segment is diminutive or absent, and is probably non dominant rather than severely spasms given the mildly dominant appearance of the right siphon. The right ICA terminus, right ACA and MCA origins are normal. The anterior communicating artery is patent. Bilateral ACA branches are patent but irregular compatible with vasospasm. The right MCA M1 segment is  mildly irregular. Right MCA branches are patent but irregular compatible with spasm. The left MCA origin and M1 segment are patent. At the left MCA bifurcation or trifurcation there is a saccular 3-4 mm lesion (series 402, image 233, series 407, image 46), series 405, image 15, and series 406, image 32). This is directed superiorly. There is poor enhancement of the left MCA M2 branches which may to reflect a combination of vasospasm and thrombosis. No other intracranial aneurysm identified. Venous sinuses: Not enhancing on this phase of imaging. Anatomic variants:  Probable dominant right ACA A1 segment and diminutive or absent left a 1. Other findings: Large volume left frontal and temporal lobe hemorrhage with subarachnoid extension re - demonstrated. Severe mass effect in the left hemisphere with rightward midline shift which appears increased now up to 14 mm (9 mm previously). Review of the MIP images confirms the above findings IMPRESSION: 1. Positive for a left MCA bifurcation aneurysm directed superiorly. I suspect the aneurysm is partially thrombosed, and the enhancing portion is 3-4 mm diameter. Preliminary report of this finding discussed by telephone with Dr. Wallie Char at 2010 hours on 11/01/2016. 2. Large volume intracranial hemorrhage with interval increase midline rightward midline shift now up to 14 mm. This was relayed via text pager to Dr. Nicole Kindred on 10/16/2016 at 20:18. 3. Intracranial artery vasospasm. Poor enhancement of left MCA branches probably reflects a combination of vasospasm and M2 branch thrombosis. 4. Negative cervical carotid and vertebral arteries. Mild to moderate left ICA siphon calcified plaque without significant stenosis. 5. Oral enteric tube looped in the nasopharynx. Recommend repositioning when feasible. Electronically Signed   By: Genevie Ann M.D.   On: 10/09/2016 20:21   Dg Chest Port 1 View  Result Date: 11/02/2016 CLINICAL DATA:  Status post intubation. EXAM: PORTABLE CHEST 1 VIEW COMPARISON:  Radiographs of April 22, 2015. FINDINGS: The heart size and mediastinal contours are within normal limits. Both lungs are clear. Nasogastric tube is seen entering stomach. Endotracheal tube is projected over tracheal air shadow, with distal tip 5 cm above the carina. No pneumothorax or pleural effusion is noted. The visualized skeletal structures are unremarkable. IMPRESSION: Endotracheal and nasogastric tubes in grossly good position. No acute cardiopulmonary abnormality seen. Electronically Signed   By: Marijo Conception, M.D.   On:  10/30/2016 19:48   Ct Head Code Stroke W/o Cm  Result Date: 10/30/2016 CLINICAL DATA:  Code stroke. Severe headache beginning today and hypertension with facial droop. Factor 5 Leiden deficiency. EXAM: CT HEAD WITHOUT CONTRAST TECHNIQUE: Contiguous axial images were obtained from the base of the skull through the vertex without intravenous contrast. COMPARISON:  04/04/2015. FINDINGS: Brain: There is a large area of subarachnoid and parenchymal hemorrhage, epicenter LEFT sylvian fissure. Parenchymal hemorrhage extends into the frontal lobe, insula, and temporal lobe. Subarachnoid blood does extend to the suprasellar cistern and crosses the midline, also extending over the convexity. Cross-sectional measurements of the bleed are 40 x 68 x 53 mm, estimated volume of 72 mL. There is significant LEFT uncal herniation with brainstem rotation. LEFT-to-RIGHT shift is 9 mm. Vascular: Vessels are obscured by subarachnoid blood. No unusual calcifications. Skull: Normal. Negative for fracture or focal lesion. Sinuses/Orbits: No acute finding. Other: None. ASPECTS [facility] Stroke Program Early CT Score) Not applicable. IMPRESSION: 1. Large area of subarachnoid and parenchymal hemorrhage centered in the LEFT sylvian fissure. LEFT MCA aneurysmal bleed is suspected. LEFT-to-RIGHT shift  9 mm.  Uncal herniation. Critical Value/emergent results were called by telephone at  the time of interpretation on 10/09/2016 at 7:30 pm to Dr. Nicole Kindred , who verbally acknowledged these results. Electronically Signed   By: Staci Righter M.D.   On: 10/15/2016 19:40     CULTURES: none  ANTIBIOTICS: none  SIGNIFICANT EVENTS: Catastrophic neurologic event: ruptured aneurysm.   LINES/TUBES: ETT  DISCUSSION: Family wishes to pursue comfort care tonight. They would prefer to be moved to ICU to facilitate additional family visitation and palliative extubation.No blood draws, q shift vital. No invasive tests or monitoring.  ASSESSMENT /  PLAN:  PULMONARY A:Intubated for airway protection. Will remain so until family gathers at bedside. They intend to honor her wishes and pursue comfort care tonight. Will move from ED to ICU to facilitate this process. P: Maintain mechanical ventilation in the short term.  CARDIOVASCULAR A: not active  RENAL A:  Not active  GASTROINTESTINAL A:  No active  HEMATOLOGIC A:  Anticoagulation reversed. Will not pursue additional monitoring   INFECTIOUS A:  Not active   ENDOCRINE A:  Not active   NEUROLOGIC A:  Catastrophic neurologic event. Family wishes to pursue comfort care when rest of the family presents to bedside. No further medical or surgical intervention P:   RASS goal: 0 Maintain comfort   FAMILY  - Updates: family meeting held with son, daughter, pastor, chaplain, and spouse. The patient was very clear on her wishes, as recently as last week. Mr  Trudie Buckler is aware of her wishes and intends to respect them. He asks for more time to sit with her. The plan is to present to ICU, and family will initiate comfort care.  Patient remains DNR, Do not reintubate, no pressors, no cardioversion, no antiarrhythmics.   - Inter-disciplinary family meet or Palliative Care meeting due by:  Day 1   The patient is critically ill with multiple organ system failure and requires high complexity decision making for assessment and support, frequent evaluation and titration of therapies, advanced monitoring, review of radiographic studies and interpretation of complex data.   Critical Care Time devoted to patient care services, exclusive of separately billable procedures, described in this note is 40 minutes.   Derinda Sis DO Pulmonary and [place] Pager: [phone]  10/30/2016, 9:33 PM

## 2016-10-30 NOTE — ED Provider Notes (Signed)
[facility] DEPT Provider Note   CSN: [id number] Arrival date & time: 10/16/2016  1846     History   Chief Complaint Chief Complaint  Patient presents with  . Code Stroke    HPI Jamie Coffey is a 76 y.o. female.  Pt is a 76yo with hx of a-fib on coumadin, aortic insufficiency and HTN who presents with headache and altered mental status. Per the patient's husband, she was at a soccer game and had a sudden onset of a headache. She was able to be driven in to the ED by her husband and was able to walk into triage. When she got into triage she became less responsive and was brought back to room. The patient's husband denies any prior history of strokes. She recently had her INR checked yesterday and it was in the normal range. There is no recent history of trauma.      Past Medical History:  Diagnosis Date  . Anticoagulated on Coumadin   . Aortic valve insufficiency 05/27/2015   Moderate by echo  . Arthritis    knees  . Bursitis of hip   . Delayed surgical wound healing    03-25-2015  S/P   RIGHT TOTAL KNEE--  DID HAVE WOUND-VAC NOW DOWN TO JUST LARGE BAND-AID  . Depression   . Factor 5 Leiden mutation, heterozygous ([place])   . Family history of adverse reaction to anesthesia    mother developed irreg heart rate during surgery  . Frequency of urination   . GERD (gastroesophageal reflux disease)   . Gross hematuria   . H/O hiatal hernia   . History of benign bladder tumor   . 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 History of DVT & PE ~ 2000   S/P RLE knee scope  . History of kidney stones   . Hypertension   . Hypothyroidism   . Nephrolithiasis    non-obstructive  . Pelvic mass   . Permanent atrial fibrillation ([place])    a. 05/2011 Echo: EF 70%, triv MR, mild AI, mod TR, Gr 1 DD.  Marland Kitchen Sleep deprivation   . Urgency of urination     Patient Active Problem List   Diagnosis Date Noted  . Subarachnoid hemorrhage due to ruptured aneurysm ([place]) 11/04/2016  . Pelvic mass in female   . Aortic valve insufficiency 05/27/2015  . Symptomatic anemia 04/03/2015  . S/P right TKA 03/25/2015  . S/P knee replacement 03/25/2015  . Encounter for therapeutic drug monitoring 09/04/2014  . History of pulmonary embolism 01/19/2014  . Chronic anticoagulation- Xarelto 01/19/2014  . Non-cardiac chest pain 01/19/2014  . Abnormal EKG 11/22/2013  . Persistent atrial fibrillation ([place])   . Bradycardia 11/29/2012  . Factor 5 Leiden mutation, heterozygous ([place])   . Hypertension     Past Surgical History:  Procedure Laterality Date  . ABDOMINAL HYSTERECTOMY  1983  . BREAST BIOPSY Right 2010  . CARDIOVASCULAR STRESS TEST  12-11-2013   dr Tressia Miners turner   normal nuclear study/  no ischemia, normal LV function and wall function, ef 65%  . CATARACT EXTRACTION W/ INTRAOCULAR LENS  IMPLANT, BILATERAL  2012  . CYSTO/  BILATERAL RETROGRADE PYELOGRAM/  BLADDER BX AND FULGERATION  01-07-2007  . CYSTO/  RETROGRADE PYELOGRAM/  RIGHT URETERAL STENT PLACEMENT  09-15-2007  . CYSTOGRAM N/A 08/21/2015   Procedure: CYSTOGRAM;  Surgeon: Rana Snare, MD;  [facility];  Service: Urology;  Laterality: N/A;  . CYSTOSCOPY WITH BIOPSY N/A 08/21/2015   Procedure: CYSTOSCOPY WITH BLADDER BIOPSY;  Surgeon: Rana Snare, MD;  Location: [facility];  Service: Urology;  Laterality: N/A;  . DILATION AND CURETTAGE OF UTERUS  1980's  . HOLMIUM LASER APPLICATION  3/47/4259   Procedure: HOLMIUM LASER APPLICATION;  Surgeon: Rana Snare, MD;  Location: [facility];  Service: Urology;;  . KNEE ARTHROSCOPY Bilateral right 05-29-2003/  left 2009  . LEFT HEART CATHETERIZATION WITH CORONARY ANGIOGRAM N/A 01/19/2014   Procedure: LEFT HEART CATHETERIZATION WITH CORONARY ANGIOGRAM;  Surgeon: [name], MD;  Location: [facility] CATH LAB;  Service: Cardiovascular;   Laterality: N/A;  Mild to moderate CAD involving , mLAD and  D2 branch/  preserved LVF, ef 65%  . NEGATIVE SLEEP STUDY  PER PT  . PARTIAL MASTECTOMY WITH AXILLARY SENTINEL LYMPH NODE BIOPSY Left 08-05-2009  . TONSILLECTOMY  child  . TOTAL KNEE ARTHROPLASTY Right 03/25/2015   Procedure: RIGHT TOTAL KNEE ARTHROPLASTY;  Surgeon: Paralee Cancel, MD;  Location: [facility];  Service: Orthopedics;  Laterality: Right;  . TRANSTHORACIC ECHOCARDIOGRAM  01-10-2015   grade 1 diastolic dysfunction, ef 56-38%/  moderate AR/  trivial MR and PR/  mild TR/  PA  systolic pressure increase (peak pressure 61mmHg S)  . TUBAL LIGATION  1980's    OB History    No data available       Home Medications    Prior to Admission medications   Medication Sig Start Date End Date Taking? Authorizing Provider  amLODipine (NORVASC) 2.5 MG tablet TAKE 1 TABLET BY MOUTH ONCE DAILY 05/29/16   Sueanne Margarita, MD  celecoxib (CELEBREX) 200 MG capsule Take 200 mg by mouth daily as needed for mild pain.     Historical Provider, MD  hydrocortisone 2.5 % lotion Apply 1 application topically daily as needed (skin irritation).  05/30/15   Historical Provider, MD  levothyroxine (SYNTHROID, LEVOTHROID) 75 MCG tablet Take 75 mcg by mouth daily before breakfast. PT HAS TO TAKE NAME BRAND    Historical Provider, MD  magnesium oxide (MAG-OX) 400 MG tablet Take 400 mg by mouth daily. As needed    Historical Provider, MD  nebivolol (BYSTOLIC) 2.5 MG tablet Take 2.5 mg by mouth daily.    Historical Provider, MD  venlafaxine XR (EFFEXOR-XR) 75 MG 24 hr capsule Take 75 mg by mouth at bedtime.  05/16/15   Historical Provider, MD  warfarin (COUMADIN) 5 MG tablet Take 1 tablet (5 mg total) by mouth as directed. 06/16/16   Sueanne Margarita, MD  zolpidem (AMBIEN) 10 MG tablet Take 10 mg by mouth at bedtime as needed for sleep.  02/05/14   Historical Provider, MD    Family History Family History  Problem Relation Age of Onset  . Heart attack Mother      "small heart attack" at 68, died @ 75.  . Other Father     died @ 72 following knee surgery  . Other Brother     alive and well.    Social History Social History  Substance Use Topics  . Smoking status: Never Smoker  . Smokeless tobacco: Never Used  . Alcohol use Yes     Comment: occasional     Allergies   Ciprofloxacin hcl   Review of Systems Review of Systems  Unable to perform ROS: Mental status change     Physical  Exam Updated Vital Signs BP (!) 160/59   Pulse (!) 45   Resp 15   Wt 159 lb 13.3 oz (72.5 kg)   SpO2 100%   BMI 26.19 kg/m   Physical Exam  Constitutional: She appears well-developed and well-nourished.  HENT:  Head: Normocephalic and atraumatic.  Eyes: Pupils are equal, round, and reactive to light.  Neck: Normal range of motion. Neck supple.  Cardiovascular: Normal rate, regular rhythm and normal heart sounds.   Pulmonary/Chest: Effort normal and breath sounds normal. No respiratory distress. She has no wheezes. She has no rales. She exhibits no tenderness.  Abdominal: Soft. Bowel sounds are normal. There is no tenderness. There is no rebound and no guarding.  Musculoskeletal: Normal range of motion. She exhibits no edema.  Lymphadenopathy:    She has no cervical adenopathy.  Neurological:  Patient is awake with eyes open but is nonverbal. She has some grip strength on command to her left arm. Otherwise she's not following commands.  Skin: Skin is warm. No rash noted. She is diaphoretic.  Psychiatric: She has a normal mood and affect.     ED Treatments / Results  Labs (all labs ordered are listed, but only abnormal results are displayed) Labs Reviewed  PROTIME-INR - Abnormal; Notable for the following:       Result Value   Prothrombin Time 23.8 (*)    All other components within normal limits  DIFFERENTIAL - Abnormal; Notable for the following:    Lymphs Abs 4.4 (*)    All other components within normal limits  COMPREHENSIVE METABOLIC  PANEL - Abnormal; Notable for the following:    Glucose, Bld 113 (*)    All other components within normal limits  URINALYSIS, ROUTINE W REFLEX MICROSCOPIC - Abnormal; Notable for the following:    Color, Urine COLORLESS (*)    Glucose, UA 50 (*)    Hgb urine dipstick SMALL (*)    All other components within normal limits  I-STAT CHEM 8, ED - Abnormal; Notable for the following:    Potassium 3.2 (*)    Glucose, Bld 114 (*)    Calcium, Ion 1.05 (*)    All other components within normal limits  CBG MONITORING, ED - Abnormal; Notable for the following:    Glucose-Capillary 104 (*)    All other components within normal limits  I-STAT ARTERIAL BLOOD GAS, ED - Abnormal; Notable for the following:    pO2, Arterial 461.0 (*)    All other components within normal limits  ETHANOL  APTT  CBC  RAPID URINE DRUG SCREEN, HOSP PERFORMED  I-STAT TROPOININ, ED    EKG  EKG Interpretation  Date/Time:  Friday October 30 2016 19:31:22 EDT Ventricular Rate:  62 PR Interval:    QRS Duration: 92 QT Interval:  498 QTC Calculation: 506 R Axis:   48 Text Interpretation:  Sinus rhythm Nonspecific repol abnormality, diffuse leads Prolonged QT interval Baseline wander in lead(s) V3 Confirmed by Basilio Meadow  MD, Kailena Lubas ([id number]) on 10/18/2016 9:59:41 PM       Radiology Ct Angio Head W Or Wo Contrast  Result Date: 10/27/2016 CLINICAL DATA:  76 year old female with large volume of left hemisphere intra-axial as well as basilar cistern and left convexity subarachnoid hemorrhage. Suspicious for left side aneurysmal rupture. Initial encounter. EXAM: CT ANGIOGRAPHY HEAD AND NECK TECHNIQUE: Multidetector CT imaging of the head and neck was performed using the standard protocol during bolus administration of intravenous contrast. Multiplanar CT image reconstructions and MIPs were obtained  to evaluate the vascular anatomy. Carotid stenosis measurements (when applicable) are obtained utilizing NASCET criteria, using the  distal internal carotid diameter as the denominator. CONTRAST:  50 mL Isovue 370 COMPARISON:  Head CT without contrast 1921 hours today. FINDINGS: CTA NECK Skeleton: Mild motion artifact in the lower neck. No acute osseous abnormality identified. Visualized paranasal sinuses and mastoids are stable and well pneumatized. Upper chest: Negative lung apices. Negative visualized superior mediastinum. Endotracheal tube in position, tip terminates just below the aortic arch. Oral enteric tube loops in the nasopharynx (series 401, image 94) but then continues into the thoracic esophagus. Other neck: Small volume of fluid in the pharynx in the setting of intubation. Looped enteric tube at the nasopharynx. Negative parapharyngeal and retropharyngeal spaces. Mild mass effect on the sublingual space. Negative submandibular and parotid glands. Negative thyroid. No cervical lymphadenopathy. Aortic arch: 3 vessel arch configuration. Mild distal arch calcified atherosclerosis. Right carotid system: Negative. Left carotid system: Mildly tortuous proximal left CCA. Negative left carotid bifurcation. Negative cervical left ICA. Vertebral arteries:No proximal subclavian artery stenosis. Normal vertebral artery origins. Tortuous bilateral V1 segments. The right vertebral artery is mildly dominant. Both vertebral arteries are patent to the skullbase without stenosis. CTA HEAD Posterior circulation: Mildly decreased caliber and mild irregularity of the vertebral artery V4 segments which might reflect a degree of vasospasm. Both PICA origins and vertebrobasilar junction remain patent. Patent basilar artery without stenosis. Irregularity at both PCA origins and proximal PCAs favored to reflect vasospasm. Posterior communicating arteries are diminutive or absent. The P2 segments have a more normal appearance. Bilateral PCA branches are within normal limits. Anterior circulation: Both ICA siphons are patent. The right ICA terminus is better  enhancing, and on the left vasospasm is suspected in the supraclinoid segment. There is calcified siphon atherosclerosis which is up to moderate on the left and mild on the right. Both carotid termini are patent. The left ACA A1 segment is diminutive or absent, and is probably non dominant rather than severely spasms given the mildly dominant appearance of the right siphon. The right ICA terminus, right ACA and MCA origins are normal. The anterior communicating artery is patent. Bilateral ACA branches are patent but irregular compatible with vasospasm. The right MCA M1 segment is mildly irregular. Right MCA branches are patent but irregular compatible with spasm. The left MCA origin and M1 segment are patent. At the left MCA bifurcation or trifurcation there is a saccular 3-4 mm lesion (series 402, image 233, series 407, image 46), series 405, image 15, and series 406, image 32). This is directed superiorly. There is poor enhancement of the left MCA M2 branches which may to reflect a combination of vasospasm and thrombosis. No other intracranial aneurysm identified. Venous sinuses: Not enhancing on this phase of imaging. Anatomic variants: Probable dominant right ACA A1 segment and diminutive or absent left a 1. Other findings: Large volume left frontal and temporal lobe hemorrhage with subarachnoid extension re - demonstrated. Severe mass effect in the left hemisphere with rightward midline shift which appears increased now up to 14 mm (9 mm previously). Review of the MIP images confirms the above findings IMPRESSION: 1. Positive for a left MCA bifurcation aneurysm directed superiorly. I suspect the aneurysm is partially thrombosed, and the enhancing portion is 3-4 mm diameter. Preliminary report of this finding discussed by telephone with Dr. Wallie Char at 2010 hours on 10/21/2016. 2. Large volume intracranial hemorrhage with interval increase midline rightward midline shift now up to 14 mm. This  was relayed  via text pager to Dr. Nicole Kindred on 10/18/2016 at 20:18. 3. Intracranial artery vasospasm. Poor enhancement of left MCA branches probably reflects a combination of vasospasm and M2 branch thrombosis. 4. Negative cervical carotid and vertebral arteries. Mild to moderate left ICA siphon calcified plaque without significant stenosis. 5. Oral enteric tube looped in the nasopharynx. Recommend repositioning when feasible. Electronically Signed   By: Genevie Ann M.D.   On: 10/18/2016 20:21   Ct Angio Neck W And/or Wo Contrast  Result Date: 10/29/2016 CLINICAL DATA:  75 year old female with large volume of left hemisphere intra-axial as well as basilar cistern and left convexity subarachnoid hemorrhage. Suspicious for left side aneurysmal rupture. Initial encounter. EXAM: CT ANGIOGRAPHY HEAD AND NECK TECHNIQUE: Multidetector CT imaging of the head and neck was performed using the standard protocol during bolus administration of intravenous contrast. Multiplanar CT image reconstructions and MIPs were obtained to evaluate the vascular anatomy. Carotid stenosis measurements (when applicable) are obtained utilizing NASCET criteria, using the distal internal carotid diameter as the denominator. CONTRAST:  50 mL Isovue 370 COMPARISON:  Head CT without contrast 1921 hours today. FINDINGS: CTA NECK Skeleton: Mild motion artifact in the lower neck. No acute osseous abnormality identified. Visualized paranasal sinuses and mastoids are stable and well pneumatized. Upper chest: Negative lung apices. Negative visualized superior mediastinum. Endotracheal tube in position, tip terminates just below the aortic arch. Oral enteric tube loops in the nasopharynx (series 401, image 94) but then continues into the thoracic esophagus. Other neck: Small volume of fluid in the pharynx in the setting of intubation. Looped enteric tube at the nasopharynx. Negative parapharyngeal and retropharyngeal spaces. Mild mass effect on the sublingual space.  Negative submandibular and parotid glands. Negative thyroid. No cervical lymphadenopathy. Aortic arch: 3 vessel arch configuration. Mild distal arch calcified atherosclerosis. Right carotid system: Negative. Left carotid system: Mildly tortuous proximal left CCA. Negative left carotid bifurcation. Negative cervical left ICA. Vertebral arteries:No proximal subclavian artery stenosis. Normal vertebral artery origins. Tortuous bilateral V1 segments. The right vertebral artery is mildly dominant. Both vertebral arteries are patent to the skullbase without stenosis. CTA HEAD Posterior circulation: Mildly decreased caliber and mild irregularity of the vertebral artery V4 segments which might reflect a degree of vasospasm. Both PICA origins and vertebrobasilar junction remain patent. Patent basilar artery without stenosis. Irregularity at both PCA origins and proximal PCAs favored to reflect vasospasm. Posterior communicating arteries are diminutive or absent. The P2 segments have a more normal appearance. Bilateral PCA branches are within normal limits. Anterior circulation: Both ICA siphons are patent. The right ICA terminus is better enhancing, and on the left vasospasm is suspected in the supraclinoid segment. There is calcified siphon atherosclerosis which is up to moderate on the left and mild on the right. Both carotid termini are patent. The left ACA A1 segment is diminutive or absent, and is probably non dominant rather than severely spasms given the mildly dominant appearance of the right siphon. The right ICA terminus, right ACA and MCA origins are normal. The anterior communicating artery is patent. Bilateral ACA branches are patent but irregular compatible with vasospasm. The right MCA M1 segment is mildly irregular. Right MCA branches are patent but irregular compatible with spasm. The left MCA origin and M1 segment are patent. At the left MCA bifurcation or trifurcation there is a saccular 3-4 mm lesion  (series 402, image 233, series 407, image 46), series 405, image 15, and series 406, image 32). This is directed superiorly. There is  poor enhancement of the left MCA M2 branches which may to reflect a combination of vasospasm and thrombosis. No other intracranial aneurysm identified. Venous sinuses: Not enhancing on this phase of imaging. Anatomic variants: Probable dominant right ACA A1 segment and diminutive or absent left a 1. Other findings: Large volume left frontal and temporal lobe hemorrhage with subarachnoid extension re - demonstrated. Severe mass effect in the left hemisphere with rightward midline shift which appears increased now up to 14 mm (9 mm previously). Review of the MIP images confirms the above findings IMPRESSION: 1. Positive for a left MCA bifurcation aneurysm directed superiorly. I suspect the aneurysm is partially thrombosed, and the enhancing portion is 3-4 mm diameter. Preliminary report of this finding discussed by telephone with Dr. Wallie Char at 2010 hours on 11/02/2016. 2. Large volume intracranial hemorrhage with interval increase midline rightward midline shift now up to 14 mm. This was relayed via text pager to Dr. Nicole Kindred on 10/29/2016 at 20:18. 3. Intracranial artery vasospasm. Poor enhancement of left MCA branches probably reflects a combination of vasospasm and M2 branch thrombosis. 4. Negative cervical carotid and vertebral arteries. Mild to moderate left ICA siphon calcified plaque without significant stenosis. 5. Oral enteric tube looped in the nasopharynx. Recommend repositioning when feasible. Electronically Signed   By: Genevie Ann M.D.   On: 10/08/2016 20:21   Dg Chest Port 1 View  Result Date: 10/11/2016 CLINICAL DATA:  Status post intubation. EXAM: PORTABLE CHEST 1 VIEW COMPARISON:  Radiographs of April 22, 2015. FINDINGS: The heart size and mediastinal contours are within normal limits. Both lungs are clear. Nasogastric tube is seen entering stomach.  Endotracheal tube is projected over tracheal air shadow, with distal tip 5 cm above the carina. No pneumothorax or pleural effusion is noted. The visualized skeletal structures are unremarkable. IMPRESSION: Endotracheal and nasogastric tubes in grossly good position. No acute cardiopulmonary abnormality seen. Electronically Signed   By: Marijo Conception, M.D.   On: 10/29/2016 19:48   Ct Head Code Stroke W/o Cm  Result Date: 10/26/2016 CLINICAL DATA:  Code stroke. Severe headache beginning today and hypertension with facial droop. Factor 5 Leiden deficiency. EXAM: CT HEAD WITHOUT CONTRAST TECHNIQUE: Contiguous axial images were obtained from the base of the skull through the vertex without intravenous contrast. COMPARISON:  04/04/2015. FINDINGS: Brain: There is a large area of subarachnoid and parenchymal hemorrhage, epicenter LEFT sylvian fissure. Parenchymal hemorrhage extends into the frontal lobe, insula, and temporal lobe. Subarachnoid blood does extend to the suprasellar cistern and crosses the midline, also extending over the convexity. Cross-sectional measurements of the bleed are 40 x 68 x 53 mm, estimated volume of 72 mL. There is significant LEFT uncal herniation with brainstem rotation. LEFT-to-RIGHT shift is 9 mm. Vascular: Vessels are obscured by subarachnoid blood. No unusual calcifications. Skull: Normal. Negative for fracture or focal lesion. Sinuses/Orbits: No acute finding. Other: None. ASPECTS [facility] Stroke Program Early CT Score) Not applicable. IMPRESSION: 1. Large area of subarachnoid and parenchymal hemorrhage centered in the LEFT sylvian fissure. LEFT MCA aneurysmal bleed is suspected. LEFT-to-RIGHT shift  9 mm.  Uncal herniation. Critical Value/emergent results were called by telephone at the time of interpretation on 10/15/2016 at 7:30 pm to Dr. Nicole Kindred , who verbally acknowledged these results. Electronically Signed   By: Staci Righter M.D.   On: 11/03/2016 19:40     Procedures Procedure Name: Intubation Date/Time: 10/30/2016 7:35 PM Performed by: Malvin Johns Pre-anesthesia Checklist: Emergency Drugs available, Suction available, Patient identified and Patient  being monitored Oxygen Delivery Method: Ambu bag Preoxygenation: Pre-oxygenation with 100% oxygen Intubation Type: Rapid sequence Ventilation: Mask ventilation without difficulty Laryngoscope Size: Glidescope and 3 Grade View: Grade I Tube type: Subglottic suction tube Tube size: 7.5 mm Number of attempts: 1 Airway Equipment and Method: Video-laryngoscopy Placement Confirmation: ETT inserted through vocal cords under direct vision,  Positive ETCO2 and Breath sounds checked- equal and bilateral Tube secured with: ETT holder Dental Injury: Teeth and Oropharynx as per pre-operative assessment       (including critical care time)  Medications Ordered in ED Medications  iopamidol (ISOVUE-370) 76 % injection (not administered)  0.9 %  sodium chloride infusion (not administered)  ondansetron (ZOFRAN) injection 4 mg (not administered)  morphine 4 MG/ML injection 4 mg (not administered)  etomidate (AMIDATE) injection (20 mg Intravenous Given 10/24/2016 1911)  succinylcholine (ANECTINE) injection (80 mg Intravenous Given 10/28/2016 1911)  prothrombin complex conc human (KCENTRA) IVPB 2,000 Units (2,000 Units Intravenous Given 10/08/2016 2022)  phytonadione (VITAMIN K) 10 mg in dextrose 5 % 50 mL IVPB (0 mg Intravenous Stopped 10/25/2016 2030)  levETIRAcetam (KEPPRA) 1,000 mg in sodium chloride 0.9 % 100 mL IVPB (0 mg Intravenous Stopped 10/16/2016 2020)  propofol (DIPRIVAN) 1000 MG/100ML infusion (20 mcg/kg/min  72.5 kg Intravenous Restarted 10/10/2016 2115)     Initial Impression / Assessment and Plan / ED Course  I have reviewed the triage vital signs and the nursing notes.  Pertinent labs & imaging results that were available during my care of the patient were reviewed by me and considered in my  medical decision making (see chart for details).  Clinical Course as of Oct 31 2202  Fri Oct 30, 2016  2006 Dr. Arnoldo Morale to see pt.  Family updated  [MB]    Clinical Course User Index [MB] Malvin Johns, MD    19:20 Patient became less responsive and around to CT and started vomiting. She was moved to a trauma bay and intubated. Her husband was updated. 19:30 CT scan shows a large hemispheric bleed with subarachnoid extension. She was started on case and try and she also started having some seizure activity. Dr. Nicole Kindred has evaluated and ordered Keppra. She's also on a propofol drip which was increased once the seizure activity started. She also appears to have some posturing. Dr. Nicole Kindred has requested a neurosurgery consult.  Dr. Arnoldo Morale to admit. Pt is posturing.  Family will likely withdraw care in am.  CRITICAL CARE Performed by: Cheila Wickstrom Total critical care time: 70 minutes Critical care time was exclusive of separately billable procedures and treating other patients. Critical care was necessary to treat or prevent imminent or life-threatening deterioration. Critical care was time spent personally by me on the following activities: development of treatment plan with patient and/or surrogate as well as nursing, discussions with consultants, evaluation of patient's response to treatment, examination of patient, obtaining history from patient or surrogate, ordering and performing treatments and interventions, ordering and review of laboratory studies, ordering and review of radiographic studies, pulse oximetry and re-evaluation of patient's condition.   Final Clinical Impressions(s) / ED Diagnoses   Final diagnoses:  Intracranial hemorrhage [facility])    New Prescriptions New Prescriptions   No medications on file     Malvin Johns, MD 10/30/16 2204

## 2016-10-30 NOTE — ED Notes (Signed)
Patient starting to vomit on the way to CT

## 2016-10-30 NOTE — ED Notes (Signed)
Family back at bedside.

## 2016-10-30 NOTE — Consult Note (Signed)
Admission H&P    Chief Complaint: Sudden onset of headache with subsequent speech difficulty and weakness, followed by loss of consciousness.  HPI: Jamie Coffey is an 76 y.o. female with a history of atrial fibrillation on anticoagulation with Coumadin, hypertension, hypothyroidism, history of breast cancer, aortic insufficiency and factor V Leiden heterozygous, brought to the ED following sudden onset of severe headache while attending a soccer match. Patient was noted to have difficulty responding verbally and facial droop on arrival. She subsequently became progressively more obtunded and developed nausea and vomiting. She was intubated in the ED for airway protection. CT scan of her head showed large left subarachnoid and intracerebral hemorrhage with a large amount of blood in the left sylvian fissure. 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 It was mass effect from intracerebral hemorrhage that he 14 mm left-to-right midline shift. Blood pressure was markedly elevated on admission at 264/149. She was started on propofol following intubation. Repeat blood pressure was 177/78. Seizure activity was also noted involving the right side more so than left. Propofol was increased and patient was given a loading dose of Keppra 1000 mg IV. No further seizure activity occurred. However fairly frequent occurrences of the cerebral activity were noted, right side greater than left. She was evaluated by Dr. Arnoldo Morale of the neurosurgery service. Surgical intervention was not felt to be likely benefit, as her intracranial hemorrhage did not appear to be survivable. Comfort measures are recommended.  LSN: 6:30 PM on 11/01/2016 tPA Given: No: Acute intracranial hemorrhage mRankin:  Past Medical History:  Diagnosis Date  . Anticoagulated on Coumadin   . Aortic valve insufficiency 05/27/2015   Moderate by echo  . Arthritis    knees  . Bursitis of hip   . Delayed surgical wound  healing    03-25-2015  S/P   RIGHT TOTAL KNEE--  DID HAVE WOUND-VAC NOW DOWN TO JUST LARGE BAND-AID  . Depression   . Factor 5 Leiden mutation, heterozygous ([place])   . Family history of adverse reaction to anesthesia    mother developed irreg heart rate during surgery  . Frequency of urination   . GERD (gastroesophageal reflux disease)   . Gross hematuria   . H/O hiatal hernia   . History of benign bladder tumor   . 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 History of DVT & PE ~ 2000   S/P RLE knee scope  . History of kidney stones   . Hypertension   . Hypothyroidism   . Nephrolithiasis    non-obstructive  . Pelvic mass   . Permanent atrial fibrillation ([place])    a. 05/2011 Echo: EF 70%, triv MR, mild AI, mod TR, Gr 1 DD.  Marland Kitchen Sleep deprivation   . Urgency of urination     Past Surgical History:  Procedure Laterality Date  . ABDOMINAL HYSTERECTOMY  1983  . BREAST BIOPSY Right 2010  . CARDIOVASCULAR STRESS TEST  12-11-2013   dr Tressia Miners turner   normal nuclear study/  no ischemia, normal LV function and wall function, ef 65%  . CATARACT EXTRACTION W/ INTRAOCULAR LENS  IMPLANT, BILATERAL  2012  . CYSTO/  BILATERAL RETROGRADE PYELOGRAM/  BLADDER BX AND FULGERATION  01-07-2007  . CYSTO/  RETROGRADE PYELOGRAM/  RIGHT URETERAL STENT PLACEMENT  09-15-2007  . CYSTOGRAM N/A  08/21/2015   Procedure: CYSTOGRAM;  Surgeon: Rana Snare, MD;  Location: [facility];  Service: Urology;  Laterality: N/A;  . CYSTOSCOPY WITH BIOPSY N/A 08/21/2015   Procedure: CYSTOSCOPY WITH BLADDER BIOPSY;  Surgeon: Rana Snare, MD;  Location: [facility];  Service: Urology;  Laterality: N/A;  . DILATION AND CURETTAGE OF UTERUS  1980's  . HOLMIUM LASER APPLICATION  [id number]/62/3762   Procedure: HOLMIUM LASER APPLICATION;  Surgeon: Rana Snare, MD;  Location: [facility];  Service:  Urology;;  . KNEE ARTHROSCOPY Bilateral right 05-29-2003/  left 2009  . 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 NEGATIVE SLEEP STUDY  PER PT  . PARTIAL MASTECTOMY WITH AXILLARY SENTINEL LYMPH NODE BIOPSY Left 08-05-2009  . TONSILLECTOMY  child  . TOTAL KNEE ARTHROPLASTY Right 03/25/2015   Procedure: RIGHT TOTAL KNEE ARTHROPLASTY;  Surgeon: Paralee Cancel, MD;  Location: [facility];  Service: Orthopedics;  Laterality: Right;  . TRANSTHORACIC ECHOCARDIOGRAM  01-10-2015   grade 1 diastolic dysfunction, ef 83-15%/  moderate AR/  trivial MR and PR/  mild TR/  PA  systolic pressure increase (peak pressure 21mHg S)  . TUBAL LIGATION  1980's    Family History  Problem Relation Age of Onset  . Heart attack Mother     "small heart attack" at 412 died @ 877  . Other Father     died @ 891following knee surgery  . Other Brother     alive and well.   Social History:  reports that she has never smoked. She has never used smokeless tobacco. She reports that she drinks alcohol. She reports that she does not use drugs.  Allergies:  Allergies  Allergen Reactions  . Ciprofloxacin Hcl Swelling    .throat swelled    Medications: Patient's preadmission medications were reviewed by me.  ROS: Unavailable due to patient's mental status changes.  Physical Examination: Blood pressure (!) 177/78, pulse (!) 57, resp. rate 13, weight 72.5 kg (159 lb 13.3 oz), SpO2 100 %.  HEENT-  Normocephalic, no lesions, without obvious abnormality.  Normal external eye and conjunctiva.  Normal TM's bilaterally.  Normal auditory canals and external ears. Normal external nose, mucus membranes and septum.  Normal pharynx. Neck supple with no masses, nodes,  nodules or enlargement. Cardiovascular - regular rate and rhythm, S1, S2 normal, no murmur, click, rub or gallop Lungs - chest clear, no wheezing, rales, normal symmetric air entry Abdomen - soft, non-tender; bowel sounds normal; no masses,  no organomegaly Extremities - no joint deformities, effusion, or inflammation and no edema  Neurologic Examination: Patient was intubated and sedated on propofol. She was responsive to noxious stimuli with bilateral decerebrate posturing. No spontaneous respirations were noted. Pupils were equal at 4 mm and did not react to light. Extraocular movements were absent cephalic maneuvers. Face appeared to be symmetrical. Patient had no spontaneous movements of extremities other than posturing movements. Muscle tone was increased throughout. Deep tendon reflexes were hyper active and symmetrical throughout. Plantar responses were extensor bilaterally.  Results for orders placed or performed during the hospital encounter of 11/01/2016 (from the past 48 hour(s))  CBG monitoring, ED     Status: Abnormal   Collection Time: 10/21/2016  7:04  PM  Result Value Ref Range   Glucose-Capillary 104 (H) 65 - 99 mg/dL  Protime-INR     Status: Abnormal   Collection Time: 11/02/2016  7:09 PM  Result Value Ref Range   Prothrombin Time 23.8 (H) 11.4 - 15.2 seconds   INR 2.09   APTT     Status: None   Collection Time: 11/01/2016  7:09 PM  Result Value Ref Range   aPTT 33 24 - 36 seconds  CBC     Status: None   Collection Time: 10/13/2016  7:09 PM  Result Value Ref Range   WBC 8.1 4.0 - 10.5 K/uL   RBC 4.69 3.87 - 5.11 MIL/uL   Hemoglobin 14.2 12.0 - 15.0 g/dL   HCT 41.9 36.0 - 46.0 %   MCV 89.3 78.0 - 100.0 fL   MCH 30.3 26.0 - 34.0 pg   MCHC 33.9 30.0 - 36.0 g/dL   RDW 13.4 11.5 - 15.5 %   Platelets 250 150 - 400 K/uL  Differential     Status: Abnormal   Collection Time: 10/26/2016  7:09 PM  Result Value Ref Range   Neutrophils Relative % 35 %   Neutro Abs 2.8 1.7 - 7.7  K/uL   Lymphocytes Relative 54 %   Lymphs Abs 4.4 (H) 0.7 - 4.0 K/uL   Monocytes Relative 9 %   Monocytes Absolute 0.7 0.1 - 1.0 K/uL   Eosinophils Relative 2 %   Eosinophils Absolute 0.1 0.0 - 0.7 K/uL   Basophils Relative 0 %   Basophils Absolute 0.0 0.0 - 0.1 K/uL  Comprehensive metabolic panel     Status: Abnormal   Collection Time: 10/08/2016  7:09 PM  Result Value Ref Range   Sodium 140 135 - 145 mmol/L   Potassium 3.5 3.5 - 5.1 mmol/L   Chloride 104 101 - 111 mmol/L   CO2 22 22 - 32 mmol/L   Glucose, Bld 113 (H) 65 - 99 mg/dL   BUN 17 6 - 20 mg/dL   Creatinine, Ser 0.78 0.44 - 1.00 mg/dL   Calcium 9.6 8.9 - 10.3 mg/dL   Total Protein 7.2 6.5 - 8.1 g/dL   Albumin 4.6 3.5 - 5.0 g/dL   AST 32 15 - 41 U/L   ALT 16 14 - 54 U/L   Alkaline Phosphatase 92 38 - 126 U/L   Total Bilirubin 0.8 0.3 - 1.2 mg/dL   GFR calc non Af Amer >60 >60 mL/min   GFR calc Af Amer >60 >60 mL/min    Comment: (NOTE) The eGFR has been calculated using the CKD EPI equation. This calculation has not been validated in all clinical situations. eGFR's persistently <60 mL/min signify possible Chronic Kidney Disease.    Anion gap 14 5 - 15  I-stat troponin, ED (not at [facility], [facility])     Status: None   Collection Time: 11/06/2016  7:11 PM  Result Value Ref Range   Troponin i, poc 0.00 0.00 - 0.08 ng/mL   Comment 3            Comment: Due to the release kinetics of cTnI, a negative result within the first hours of the onset of symptoms does not rule out myocardial infarction with certainty. If myocardial infarction is still suspected, repeat the test at appropriate intervals.   I-Stat Chem 8, ED  (not at [facility], [facility])     Status: Abnormal   Collection Time: 11/05/2016  7:12 PM  Result Value Ref Range   Sodium  142 135 - 145 mmol/L   Potassium 3.2 (L) 3.5 - 5.1 mmol/L   Chloride 105 101 - 111 mmol/L   BUN 20 6 - 20 mg/dL   Creatinine, Ser 0.70 0.44 - 1.00 mg/dL   Glucose, Bld 114 (H) 65 - 99 mg/dL   Calcium,  Ion 1.05 (L) 1.15 - 1.40 mmol/L   TCO2 25 0 - 100 mmol/L   Hemoglobin 14.3 12.0 - 15.0 g/dL   HCT 42.0 36.0 - 46.0 %  Ethanol     Status: None   Collection Time: 10/12/2016  7:23 PM  Result Value Ref Range   Alcohol, Ethyl (B) <5 <5 mg/dL    Comment:        LOWEST DETECTABLE LIMIT FOR SERUM ALCOHOL IS 5 mg/dL FOR MEDICAL PURPOSES ONLY   I-Stat arterial blood gas, ED     Status: Abnormal   Collection Time: 10/11/2016  8:21 PM  Result Value Ref Range   pH, Arterial 7.371 7.350 - 7.450   pCO2 arterial 47.0 32.0 - 48.0 mmHg   pO2, Arterial 461.0 (H) 83.0 - 108.0 mmHg   Bicarbonate 27.5 20.0 - 28.0 mmol/L   TCO2 29 0 - 100 mmol/L   O2 Saturation 100.0 %   Acid-Base Excess 1.0 0.0 - 2.0 mmol/L   Patient temperature 97.0 F    Collection site RADIAL, ALLEN'S TEST ACCEPTABLE    Drawn by RT    Sample type ARTERIAL   Urinalysis, Routine w reflex microscopic     Status: Abnormal   Collection Time: 10/09/2016  8:40 PM  Result Value Ref Range   Color, Urine COLORLESS (A) YELLOW   APPearance CLEAR CLEAR   Specific Gravity, Urine 1.013 1.005 - 1.030   pH 8.0 5.0 - 8.0   Glucose, UA 50 (A) NEGATIVE mg/dL   Hgb urine dipstick SMALL (A) NEGATIVE   Bilirubin Urine NEGATIVE NEGATIVE   Ketones, ur NEGATIVE NEGATIVE mg/dL   Protein, ur NEGATIVE NEGATIVE mg/dL   Nitrite NEGATIVE NEGATIVE   Leukocytes, UA NEGATIVE NEGATIVE   RBC / HPF 0-5 0 - 5 RBC/hpf   WBC, UA 0-5 0 - 5 WBC/hpf   Bacteria, UA NONE SEEN NONE SEEN   Squamous Epithelial / LPF NONE SEEN NONE SEEN   Ct Angio Head W Or Wo Contrast  Result Date: 10/19/2016 CLINICAL DATA:  76 year old female with large volume of left hemisphere intra-axial as well as basilar cistern and left convexity subarachnoid hemorrhage. Suspicious for left side aneurysmal rupture. Initial encounter. EXAM: CT ANGIOGRAPHY HEAD AND NECK TECHNIQUE: Multidetector CT imaging of the head and neck was performed using the standard protocol during bolus administration of  intravenous contrast. Multiplanar CT image reconstructions and MIPs were obtained to evaluate the vascular anatomy. Carotid stenosis measurements (when applicable) are obtained utilizing NASCET criteria, using the distal internal carotid diameter as the denominator. CONTRAST:  50 mL Isovue 370 COMPARISON:  Head CT without contrast 1921 hours today. FINDINGS: CTA NECK Skeleton: Mild motion artifact in the lower neck. No acute osseous abnormality identified. Visualized paranasal sinuses and mastoids are stable and well pneumatized. Upper chest: Negative lung apices. Negative visualized superior mediastinum. Endotracheal tube in position, tip terminates just below the aortic arch. Oral enteric tube loops in the nasopharynx (series 401, image 94) but then continues into the thoracic esophagus. Other neck: Small volume of fluid in the pharynx in the setting of intubation. Looped enteric tube at the nasopharynx. Negative parapharyngeal and retropharyngeal spaces. Mild mass effect on the sublingual space.  Negative submandibular and parotid glands. Negative thyroid. No cervical lymphadenopathy. Aortic arch: 3 vessel arch configuration. Mild distal arch calcified atherosclerosis. Right carotid system: Negative. Left carotid system: Mildly tortuous proximal left CCA. Negative left carotid bifurcation. Negative cervical left ICA. Vertebral arteries:No proximal subclavian artery stenosis. Normal vertebral artery origins. Tortuous bilateral V1 segments. The right vertebral artery is mildly dominant. Both vertebral arteries are patent to the skullbase without stenosis. CTA HEAD Posterior circulation: Mildly decreased caliber and mild irregularity of the vertebral artery V4 segments which might reflect a degree of vasospasm. Both PICA origins and vertebrobasilar junction remain patent. Patent basilar artery without stenosis. Irregularity at both PCA origins and proximal PCAs favored to reflect vasospasm. Posterior communicating  arteries are diminutive or absent. The P2 segments have a more normal appearance. Bilateral PCA branches are within normal limits. Anterior circulation: Both ICA siphons are patent. The right ICA terminus is better enhancing, and on the left vasospasm is suspected in the supraclinoid segment. There is calcified siphon atherosclerosis which is up to moderate on the left and mild on the right. Both carotid termini are patent. The left ACA A1 segment is diminutive or absent, and is probably non dominant rather than severely spasms given the mildly dominant appearance of the right siphon. The right ICA terminus, right ACA and MCA origins are normal. The anterior communicating artery is patent. Bilateral ACA branches are patent but irregular compatible with vasospasm. The right MCA M1 segment is mildly irregular. Right MCA branches are patent but irregular compatible with spasm. The left MCA origin and M1 segment are patent. At the left MCA bifurcation or trifurcation there is a saccular 3-4 mm lesion (series 402, image 233, series 407, image 46), series 405, image 15, and series 406, image 32). This is directed superiorly. There is poor enhancement of the left MCA M2 branches which may to reflect a combination of vasospasm and thrombosis. No other intracranial aneurysm identified. Venous sinuses: Not enhancing on this phase of imaging. Anatomic variants: Probable dominant right ACA A1 segment and diminutive or absent left a 1. Other findings: Large volume left frontal and temporal lobe hemorrhage with subarachnoid extension re - demonstrated. Severe mass effect in the left hemisphere with rightward midline shift which appears increased now up to 14 mm (9 mm previously). Review of the MIP images confirms the above findings IMPRESSION: 1. Positive for a left MCA bifurcation aneurysm directed superiorly. I suspect the aneurysm is partially thrombosed, and the enhancing portion is 3-4 mm diameter. Preliminary report of this  finding discussed by telephone with Dr. Wallie Char at 2010 hours on 11/06/2016. 2. Large volume intracranial hemorrhage with interval increase midline rightward midline shift now up to 14 mm. This was relayed via text pager to Dr. Nicole Kindred on 10/25/2016 at 20:18. 3. Intracranial artery vasospasm. Poor enhancement of left MCA branches probably reflects a combination of vasospasm and M2 branch thrombosis. 4. Negative cervical carotid and vertebral arteries. Mild to moderate left ICA siphon calcified plaque without significant stenosis. 5. Oral enteric tube looped in the nasopharynx. Recommend repositioning when feasible. Electronically Signed   By: Genevie Ann M.D.   On: 11/06/2016 20:21   Ct Angio Neck W And/or Wo Contrast  Result Date: 10/24/2016 CLINICAL DATA:  76 year old female with large volume of left hemisphere intra-axial as well as basilar cistern and left convexity subarachnoid hemorrhage. Suspicious for left side aneurysmal rupture. Initial encounter. EXAM: CT ANGIOGRAPHY HEAD AND NECK TECHNIQUE: Multidetector CT imaging of the head and neck was  performed using the standard protocol during bolus administration of intravenous contrast. Multiplanar CT image reconstructions and MIPs were obtained to evaluate the vascular anatomy. Carotid stenosis measurements (when applicable) are obtained utilizing NASCET criteria, using the distal internal carotid diameter as the denominator. CONTRAST:  50 mL Isovue 370 COMPARISON:  Head CT without contrast 1921 hours today. FINDINGS: CTA NECK Skeleton: Mild motion artifact in the lower neck. No acute osseous abnormality identified. Visualized paranasal sinuses and mastoids are stable and well pneumatized. Upper chest: Negative lung apices. Negative visualized superior mediastinum. Endotracheal tube in position, tip terminates just below the aortic arch. Oral enteric tube loops in the nasopharynx (series 401, image 94) but then continues into the thoracic esophagus. Other  neck: Small volume of fluid in the pharynx in the setting of intubation. Looped enteric tube at the nasopharynx. Negative parapharyngeal and retropharyngeal spaces. Mild mass effect on the sublingual space. Negative submandibular and parotid glands. Negative thyroid. No cervical lymphadenopathy. Aortic arch: 3 vessel arch configuration. Mild distal arch calcified atherosclerosis. Right carotid system: Negative. Left carotid system: Mildly tortuous proximal left CCA. Negative left carotid bifurcation. Negative cervical left ICA. Vertebral arteries:No proximal subclavian artery stenosis. Normal vertebral artery origins. Tortuous bilateral V1 segments. The right vertebral artery is mildly dominant. Both vertebral arteries are patent to the skullbase without stenosis. CTA HEAD Posterior circulation: Mildly decreased caliber and mild irregularity of the vertebral artery V4 segments which might reflect a degree of vasospasm. Both PICA origins and vertebrobasilar junction remain patent. Patent basilar artery without stenosis. Irregularity at both PCA origins and proximal PCAs favored to reflect vasospasm. Posterior communicating arteries are diminutive or absent. The P2 segments have a more normal appearance. Bilateral PCA branches are within normal limits. Anterior circulation: Both ICA siphons are patent. The right ICA terminus is better enhancing, and on the left vasospasm is suspected in the supraclinoid segment. There is calcified siphon atherosclerosis which is up to moderate on the left and mild on the right. Both carotid termini are patent. The left ACA A1 segment is diminutive or absent, and is probably non dominant rather than severely spasms given the mildly dominant appearance of the right siphon. The right ICA terminus, right ACA and MCA origins are normal. The anterior communicating artery is patent. Bilateral ACA branches are patent but irregular compatible with vasospasm. The right MCA M1 segment is mildly  irregular. Right MCA branches are patent but irregular compatible with spasm. The left MCA origin and M1 segment are patent. At the left MCA bifurcation or trifurcation there is a saccular 3-4 mm lesion (series 402, image 233, series 407, image 46), series 405, image 15, and series 406, image 32). This is directed superiorly. There is poor enhancement of the left MCA M2 branches which may to reflect a combination of vasospasm and thrombosis. No other intracranial aneurysm identified. Venous sinuses: Not enhancing on this phase of imaging. Anatomic variants: Probable dominant right ACA A1 segment and diminutive or absent left a 1. Other findings: Large volume left frontal and temporal lobe hemorrhage with subarachnoid extension re - demonstrated. Severe mass effect in the left hemisphere with rightward midline shift which appears increased now up to 14 mm (9 mm previously). Review of the MIP images confirms the above findings IMPRESSION: 1. Positive for a left MCA bifurcation aneurysm directed superiorly. I suspect the aneurysm is partially thrombosed, and the enhancing portion is 3-4 mm diameter. Preliminary report of this finding discussed by telephone with Dr. Wallie Char at 2010 hours on  10/28/2016. 2. Large volume intracranial hemorrhage with interval increase midline rightward midline shift now up to 14 mm. This was relayed via text pager to Dr. Nicole Kindred on 11/02/2016 at 20:18. 3. Intracranial artery vasospasm. Poor enhancement of left MCA branches probably reflects a combination of vasospasm and M2 branch thrombosis. 4. Negative cervical carotid and vertebral arteries. Mild to moderate left ICA siphon calcified plaque without significant stenosis. 5. Oral enteric tube looped in the nasopharynx. Recommend repositioning when feasible. Electronically Signed   By: Genevie Ann M.D.   On: 10/10/2016 20:21   Dg Chest Port 1 View  Result Date: 10/26/2016 CLINICAL DATA:  Status post intubation. EXAM: PORTABLE CHEST 1  VIEW COMPARISON:  Radiographs of April 22, 2015. FINDINGS: The heart size and mediastinal contours are within normal limits. Both lungs are clear. Nasogastric tube is seen entering stomach. Endotracheal tube is projected over tracheal air shadow, with distal tip 5 cm above the carina. No pneumothorax or pleural effusion is noted. The visualized skeletal structures are unremarkable. IMPRESSION: Endotracheal and nasogastric tubes in grossly good position. No acute cardiopulmonary abnormality seen. Electronically Signed   By: Marijo Conception, M.D.   On: 10/29/2016 19:48   Ct Head Code Stroke W/o Cm  Result Date: 10/26/2016 CLINICAL DATA:  Code stroke. Severe headache beginning today and hypertension with facial droop. Factor 5 Leiden deficiency. EXAM: CT HEAD WITHOUT CONTRAST TECHNIQUE: Contiguous axial images were obtained from the base of the skull through the vertex without intravenous contrast. COMPARISON:  04/04/2015. FINDINGS: Brain: There is a large area of subarachnoid and parenchymal hemorrhage, epicenter LEFT sylvian fissure. Parenchymal hemorrhage extends into the frontal lobe, insula, and temporal lobe. Subarachnoid blood does extend to the suprasellar cistern and crosses the midline, also extending over the convexity. Cross-sectional measurements of the bleed are 40 x 68 x 53 mm, estimated volume of 72 mL. There is significant LEFT uncal herniation with brainstem rotation. LEFT-to-RIGHT shift is 9 mm. Vascular: Vessels are obscured by subarachnoid blood. No unusual calcifications. Skull: Normal. Negative for fracture or focal lesion. Sinuses/Orbits: No acute finding. Other: None. ASPECTS [facility] Stroke Program Early CT Score) Not applicable. IMPRESSION: 1. Large area of subarachnoid and parenchymal hemorrhage centered in the LEFT sylvian fissure. LEFT MCA aneurysmal bleed is suspected. LEFT-to-RIGHT shift  9 mm.  Uncal herniation. Critical Value/emergent results were called by telephone at the  time of interpretation on 10/25/2016 at 7:30 pm to Dr. Nicole Kindred , who verbally acknowledged these results. Electronically Signed   By: Staci Righter M.D.   On: 10/19/2016 19:40    Assessment: 76 y.o. female with multiple risk factors for stroke and on anticoagulation with Coumadin, presenting with acute subarachnoid and intracerebral hemorrhage with left MCA aneurysm demonstrated on CTA at the bifurcation. Imaging of the brain showed very large amount of hemorrhage intracerebral he as well as an subarachnoid space with mass effect and midline shift. Agree that patient's prognosis is very poor, and severe is not likely survivable.  Stroke Risk Factors - atrial fibrillation, hypertension and Factor V Leiden heterozygous  Plan: Agree with plans for comfort care with no aggressive intervention, given her very poor prognosis.  We will remain available for medical reevaluation if indicated.  This patient is critically ill and at significant risk of neurological worsening or death, and care requires constant monitoring of vital signs, hemodynamics,respiratory and cardiac monitoring, neurological assessment, discussion with family, other specialists and medical decision making of high complexity. Total critical care time was 60 minutes.  C.R.  Nicole Kindred, MD [facility] [phone]   10/15/2016, 9:18 PM

## 2016-10-30 NOTE — ED Triage Notes (Signed)
Pt arrived to ED for a migraine. When getting pt for triage, dizziness was noted. Pt kept "moaning" in pain but would not talk and communicate with me about her symptoms. Asked family, husband states that pt was at soccer game and had onset of severe headache to top of head. Pt is hypertensive at triage. She progressively became more altered and obvious facial droop developed at triage. Pt became nauseated, code stroke being activated and pt taken to room.

## 2016-10-30 NOTE — ED Notes (Signed)
Posturing every few minutes. Remains hypertensive.

## 2016-10-30 NOTE — Progress Notes (Signed)
Chaplain responded to Trauma.  Family present.  Spouse, son and daughter at bedside of patient receiving update from one of the ED doctors.    Chaplain is available and will continue to follow this family.    10/25/2016 2002  Clinical Encounter Type  Visited With Family;Patient not available;Health care provider  Visit Type Trauma;ED;Spiritual support  Referral From Nurse  Spiritual Encounters  Spiritual Needs Emotional;Prayer  Stress Factors  Patient Stress Factors Major life changes;Health changes   Berneice Heinrich., Chaplain

## 2016-10-31 LAB — MRSA PCR SCREENING: MRSA by PCR: NEGATIVE

## 2016-10-31 MED ORDER — PROPOFOL 1000 MG/100ML IV EMUL
INTRAVENOUS | Status: AC
  Filled 2016-10-31: qty 100

## 2016-10-31 MED ORDER — SODIUM CHLORIDE 0.9 % IV SOLN
10.0000 mg/h | INTRAVENOUS | Status: DC
  Administered 2016-10-31: 10 mg/h via INTRAVENOUS
  Filled 2016-10-31: qty 10

## 2016-10-31 MED ORDER — MORPHINE BOLUS VIA INFUSION
5.0000 mg | INTRAVENOUS | Status: DC | PRN
  Filled 2016-10-31: qty 20

## 2016-11-08 NOTE — Progress Notes (Signed)
eLink Physician-Brief Progress Note Patient Name: Jamie Coffey DOB: 29-Dec-1940 MRN: [id number]   Date of Service  11/09/2016  HPI/Events of Note  Patient with non-survivable ICH.  Had been made DNR earlier.  Family at bedside requesting end of life interventions  eICU Interventions  Plan: Extubate to room air Morphine gtt as needed for patient comfort     Intervention Category Major Interventions: End of life / care limitation discussion  [place] Nov 09, 2016, 2:17 AM

## 2016-11-08 NOTE — ED Notes (Signed)
Patient's family came out asking to talk with doctor regarding patient's plan of care.  Critical care made aware and plans to see patient.

## 2016-11-08 NOTE — ED Notes (Signed)
CCM at bedside 

## 2016-11-08 NOTE — Progress Notes (Addendum)
Chaplain circled back around to visit with family.  Family still at bedside of patient.    11/08/16 0118  Clinical Encounter Type  Visited With Patient and family together  Visit Type Follow-up;Psychological support;Social support;Critical Care;Trauma  Stress Factors  Family Stress Factors Health changes   Chaplain escorted family to waiting room / and then the room when the nurses were ready and the patient was all settled.   This Chaplain is available until 8:30AM if needed, please page.  Chaplain called back to room for family who has decided to proceed with comfort care.  Chaplain will remain with family. Hassell Done, Sherle Poe G,Chaplain

## 2016-11-08 NOTE — Progress Notes (Signed)
Pt extubated at 0340.  Time of death 61 verified by two RN ([place] and Chesley Noon).  Patient's family were at bedside.  230 ml of Morphine wasted in sink.

## 2016-11-08 NOTE — Progress Notes (Signed)
Chaplain remained with family throughout evening until patient passed away.  Chaplain was welcomed to stay with family while spouse said a prayer.  Chaplain escorted family out to assure they knew how to get to their cars.      29-Nov-2016 0455  Clinical Encounter Type  Visited With Family  Visit Type Follow-up;Spiritual support;Death;Patient actively dying;Trauma  Spiritual Encounters  Spiritual Needs Grief support  Stress Factors  Family Stress Factors Loss    Berneice Heinrich. Chaplain

## 2016-11-08 NOTE — Progress Notes (Signed)
11-29-16- Respiratory care note- Pt terminally extubated to room air as per MD. Family at bedside immediately post extubation.  Pt suctioned prior to extubation.

## 2016-11-08 DEATH — deceased

## 2016-12-03 ENCOUNTER — Other Ambulatory Visit (HOSPITAL_COMMUNITY): Payer: Medicare Other

## 2016-12-08 NOTE — Discharge Summary (Signed)
Physician Discharge Summary  Patient ID: HALEA LIEB MRN: [id number] DOB/AGE: [birth year] 76 y.o.  Admit date: 10/16/2016 Discharge date: 11-14-2016  Admission Diagnoses:Grade 5 subarachnoid hemorrhage, intracerebral hemorrhage  Discharge Diagnoses:  The same Active Problems:   Subarachnoid hemorrhage due to ruptured aneurysm [facility])   Discharged Condition:  deceased  Hospital Course:  I admit the patient to [facility] on 10/11/2016 with a diagnosis of a grade 5 subarachnoid hemorrhage.  I spoke with the patient's family.  They made her a DNR.  The patient was extubated on Nov 14, 2016 and subsequently expired.  Consults:  None Significant Diagnostic Studies: head CT, brain CT angiogram Treatments: intubation Discharge Exam: Blood pressure (!) 94/52, pulse 60, temperature 98.3 F (36.8 C), temperature source Axillary, resp. rate 15, weight 72.5 kg (159 lb 13.3 oz), SpO2 100 %.  deceased  Disposition:  Morgue   Allergies as of Nov 14, 2016      Reactions   Ciprofloxacin Hcl Swelling   .throat swelled      Medication List    ASK your doctor about these medications   amLODipine 2.5 MG tablet Commonly known as:  NORVASC TAKE 1 TABLET BY MOUTH ONCE DAILY   celecoxib 200 MG capsule Commonly known as:  CELEBREX Take 200 mg by mouth daily as needed for mild pain.   hydrocortisone 2.5 % lotion Apply 1 application topically daily as needed (skin irritation).   levothyroxine 75 MCG tablet Commonly known as:  SYNTHROID, LEVOTHROID Take 75 mcg by mouth daily before breakfast. PT HAS TO TAKE NAME BRAND   magnesium oxide 400 MG tablet Commonly known as:  MAG-OX Take 400 mg by mouth daily. As needed   nebivolol 2.5 MG tablet Commonly known as:  BYSTOLIC Take 2.5 mg by mouth daily.   venlafaxine XR 75 MG 24 hr capsule Commonly known as:  EFFEXOR-XR Take 75 mg by mouth at bedtime.   warfarin 5 MG tablet Commonly known as:  COUMADIN Take 1 tablet (5 mg total) by  mouth as directed.   zolpidem 10 MG tablet Commonly known as:  AMBIEN Take 10 mg by mouth at bedtime as needed for sleep.        SignedOphelia Charter 12/02/2016, 6:03 PM

## 2017-07-28 IMAGING — CT CT ANGIO CHEST
2 of 9 series · 19 of 46 positions shown · IV contrast (OMNI)
Comparison: 04/22/2015 chest radiograph

CLINICAL DATA: Chest tightness. Shortness of breath. Knee surgery
in [REDACTED].

EXAM:
CT ANGIOGRAPHY CHEST WITH CONTRAST
TECHNIQUE: Multidetector CT imaging of the chest was performed using the
standard protocol during bolus administration of intravenous
contrast. Multiplanar CT image reconstructions and MIPs were
obtained to evaluate the vascular anatomy.
CONTRAST:  75mL OMNIPAQUE IOHEXOL 350 MG/ML SOLN

[Series 5: thins · axial · 0.63mm/px · z∈[+1042,+1308]mm · 16 of 300 slices shown]
[im 17/300  lung]
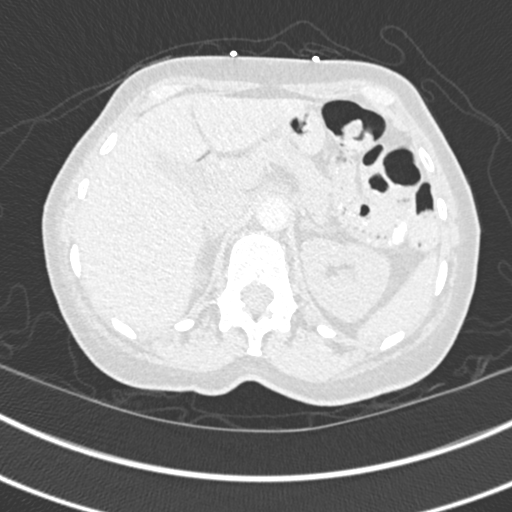
[im 34/300  soft-tissue]
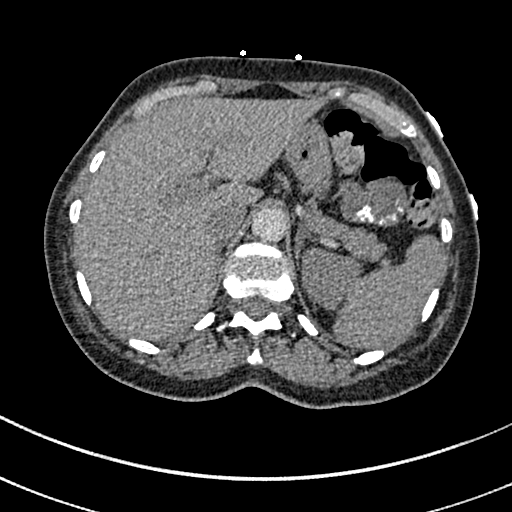
[im 50/300  lung]
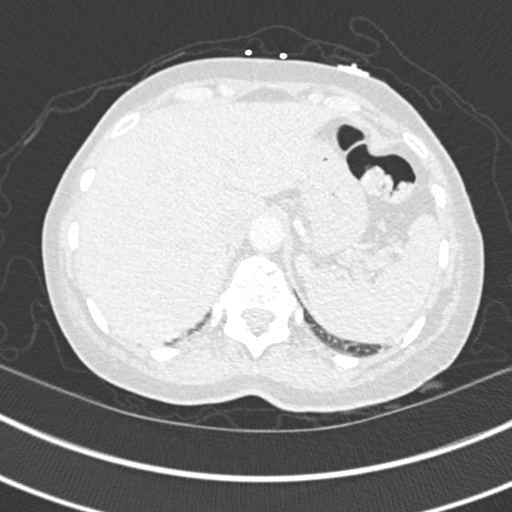
[im 67/300  soft-tissue]
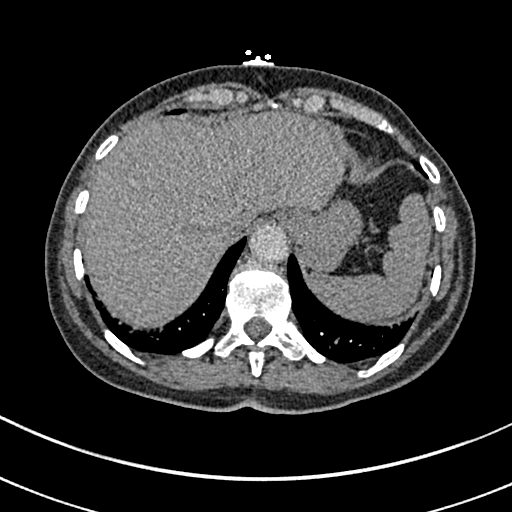
[im 84/300  lung]
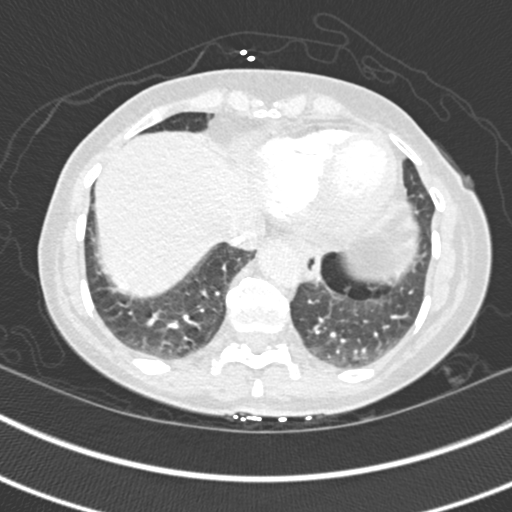
[im 100/300  soft-tissue]
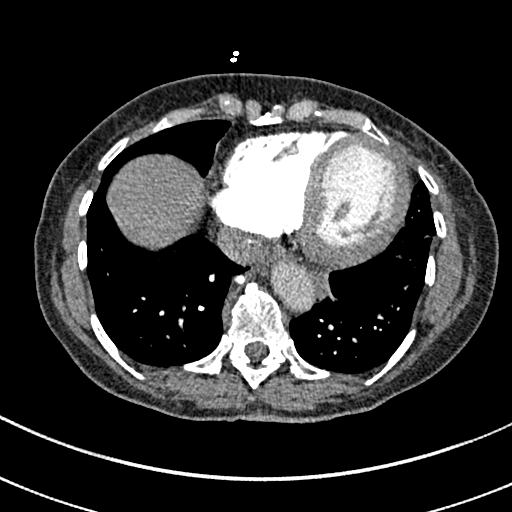
[im 117/300  lung]
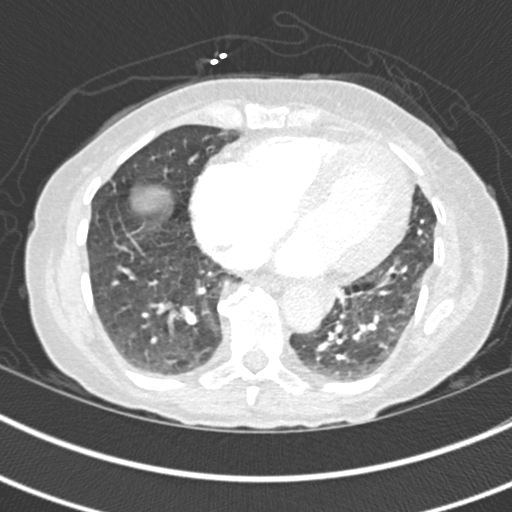
[im 133/300  soft-tissue]
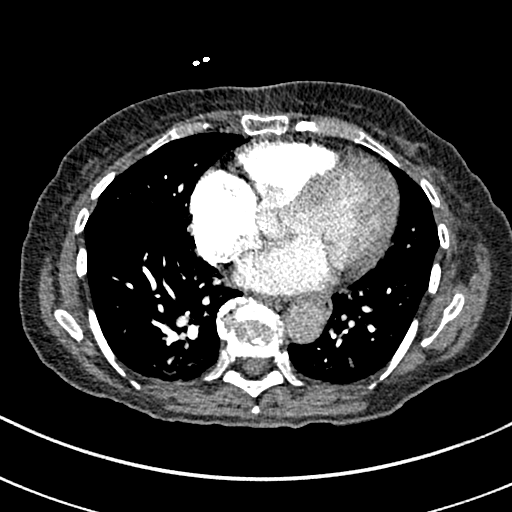
[im 167/300  lung]
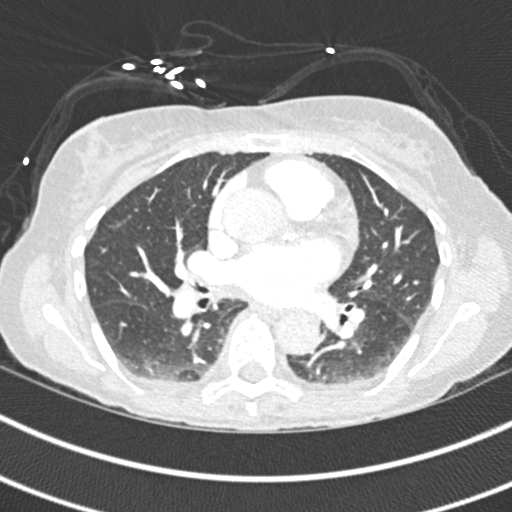
[im 183/300  soft-tissue]
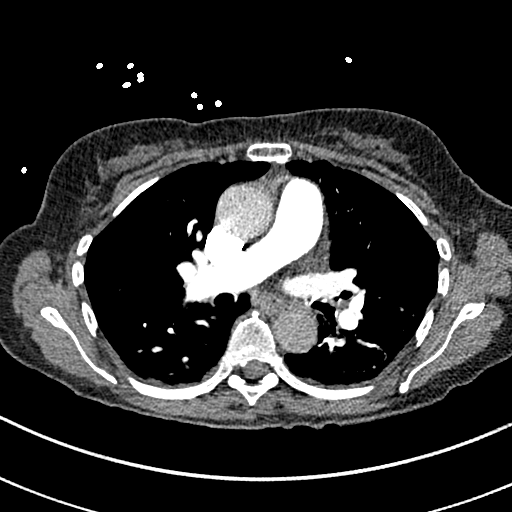
[im 200/300  lung]
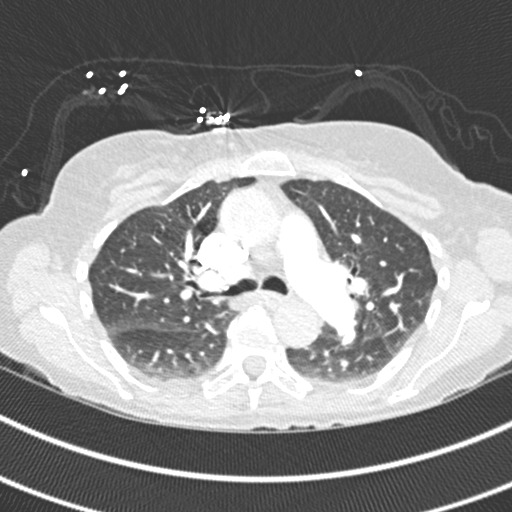
[im 216/300  soft-tissue]
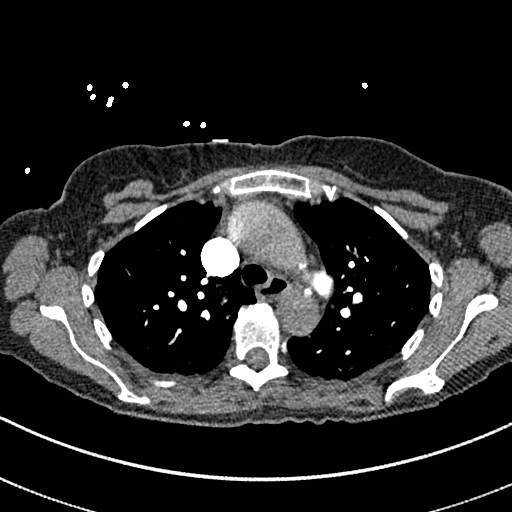
[im 233/300  lung]
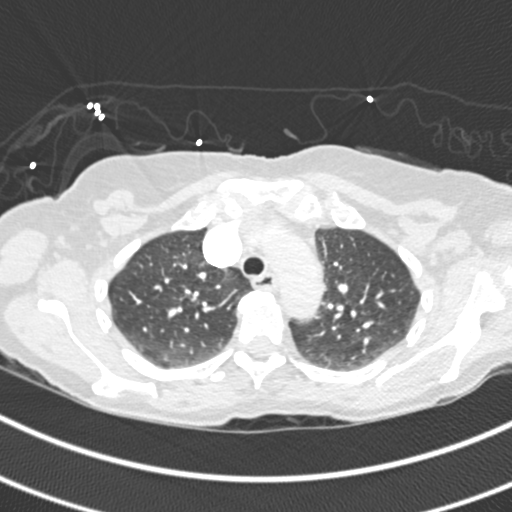
[im 250/300  soft-tissue]
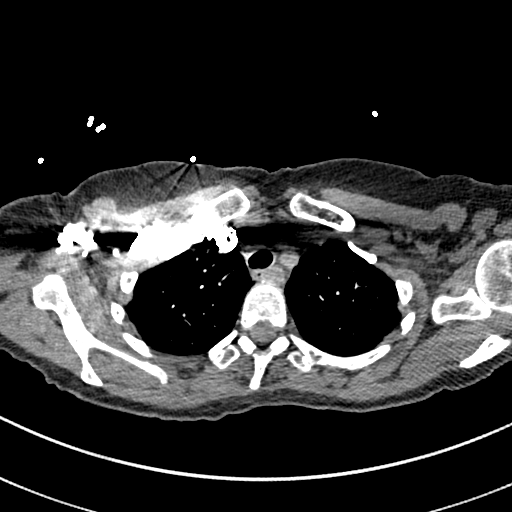
[im 266/300  lung]
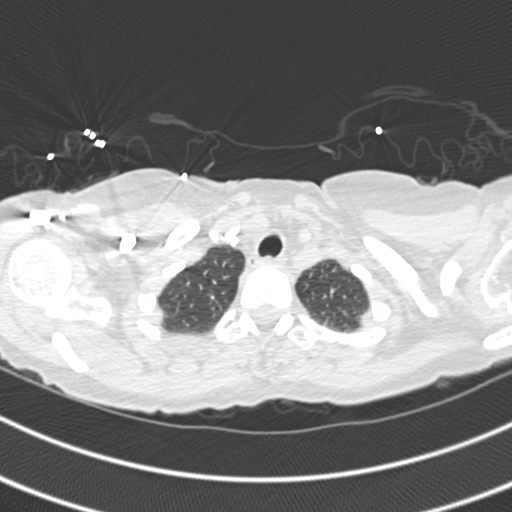
[im 283/300  soft-tissue]
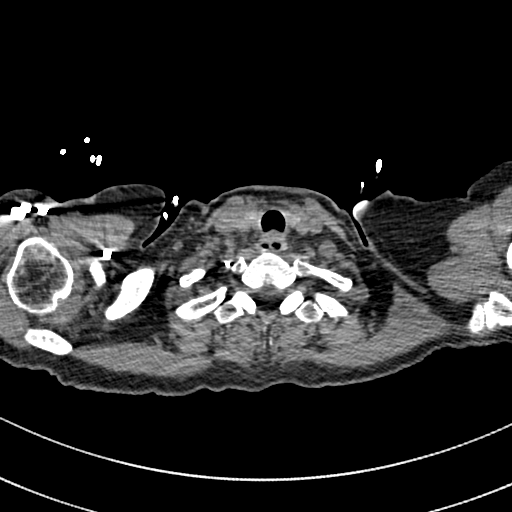

[Series 7: coronal mpr · coronal · 0.59mm/px · 3 of 109 slices shown]
[im 28/109  soft-tissue]
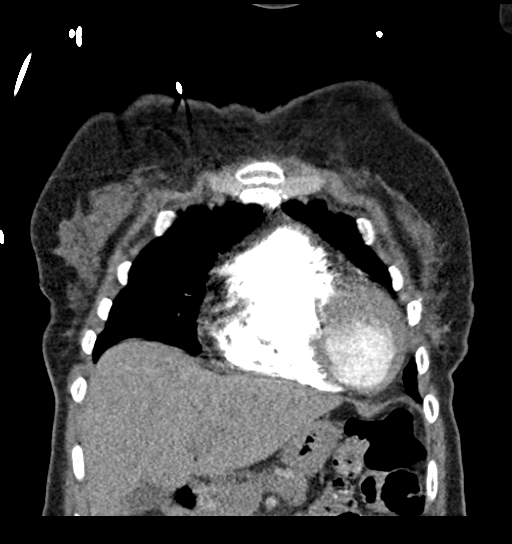
[im 55/109  soft-tissue]
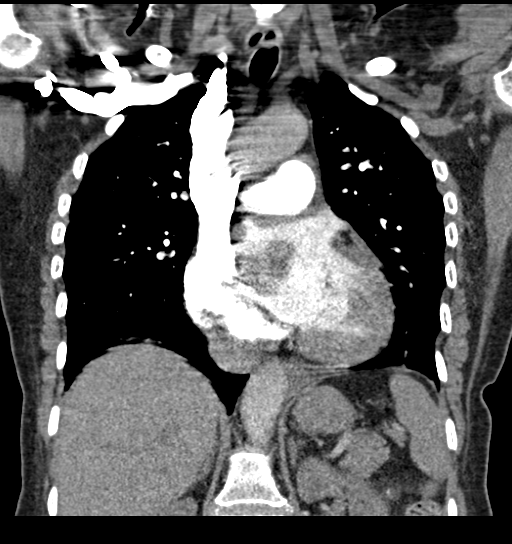
[im 82/109  soft-tissue]
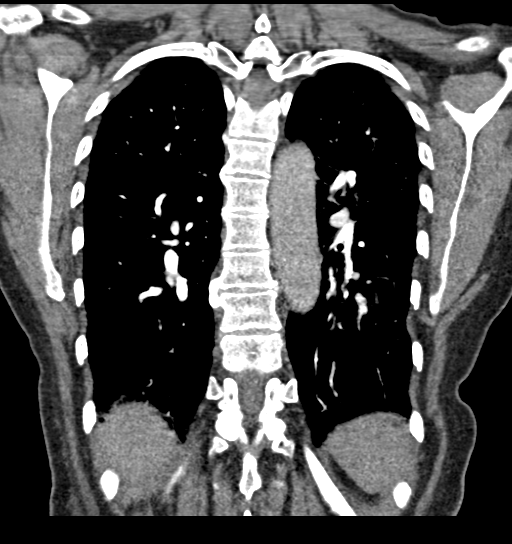

[19 of 46 positions shown; findings below may reference images not displayed]

FINDINGS: Mediastinum/Nodes: No filling defect is identified in the pulmonary
arterial tree to suggest pulmonary embolus. There is only very
dilute contrast medium in the aorta, but no acute aortic findings
are observed. Borderline cardiomegaly. No pericardial effusion. No
pathologic thoracic adenopathy.

Lungs/Pleura: 5.8 by 3.5 cm left basilar bulla in the lower lobe,
with some faint internal striations.

Upper abdomen: Unremarkable

Musculoskeletal: Thoracic spondylosis. No thoracic fracture or
subluxation.

Review of the MIP images confirms the above findings.
IMPRESSION: 1. No filling defect is identified in the pulmonary arterial tree to
suggest pulmonary embolus.
2. 5.8 cm bulla inferiorly in the left lower lobe.
3. Thoracic spondylosis.
